# Patient Record
Sex: Male | Born: 1945 | Race: White | Hispanic: No | Marital: Married | Smoking: Former smoker
Health system: Southern US, Community
[De-identification: ages and names within clinical notes are randomized; demographics above are authoritative.]

## PROBLEM LIST (undated history)

## (undated) DIAGNOSIS — K648 Other hemorrhoids: Secondary | ICD-10-CM

## (undated) DIAGNOSIS — I4891 Unspecified atrial fibrillation: Secondary | ICD-10-CM

## (undated) DIAGNOSIS — D649 Anemia, unspecified: Secondary | ICD-10-CM

## (undated) DIAGNOSIS — I4892 Unspecified atrial flutter: Secondary | ICD-10-CM

## (undated) DIAGNOSIS — K219 Gastro-esophageal reflux disease without esophagitis: Secondary | ICD-10-CM

## (undated) DIAGNOSIS — I10 Essential (primary) hypertension: Secondary | ICD-10-CM

## (undated) DIAGNOSIS — I495 Sick sinus syndrome: Secondary | ICD-10-CM

## (undated) HISTORY — PX: HEMORROIDECTOMY: SUR656

## (undated) HISTORY — PX: PACEMAKER PLACEMENT: SHX43

---

## 2004-07-16 ENCOUNTER — Emergency Department (HOSPITAL_COMMUNITY): Admission: EM | Admit: 2004-07-16 | Discharge: 2004-07-16 | Payer: Self-pay | Admitting: Emergency Medicine

## 2005-03-20 ENCOUNTER — Emergency Department: Payer: Self-pay | Admitting: Emergency Medicine

## 2007-01-28 ENCOUNTER — Emergency Department: Payer: Self-pay | Admitting: General Practice

## 2008-04-26 ENCOUNTER — Other Ambulatory Visit: Payer: Self-pay

## 2008-04-26 ENCOUNTER — Inpatient Hospital Stay: Payer: Self-pay | Admitting: Internal Medicine

## 2008-05-27 ENCOUNTER — Inpatient Hospital Stay: Payer: Self-pay | Admitting: Internal Medicine

## 2008-05-27 ENCOUNTER — Other Ambulatory Visit: Payer: Self-pay

## 2008-05-29 ENCOUNTER — Other Ambulatory Visit: Payer: Self-pay

## 2008-05-30 ENCOUNTER — Other Ambulatory Visit: Payer: Self-pay

## 2008-08-05 ENCOUNTER — Emergency Department: Payer: Self-pay | Admitting: Emergency Medicine

## 2008-08-10 ENCOUNTER — Emergency Department: Payer: Self-pay | Admitting: Emergency Medicine

## 2009-12-07 ENCOUNTER — Inpatient Hospital Stay: Payer: Self-pay | Admitting: Internal Medicine

## 2010-06-28 ENCOUNTER — Emergency Department: Payer: Self-pay | Admitting: Emergency Medicine

## 2011-01-10 ENCOUNTER — Emergency Department: Payer: Self-pay | Admitting: Emergency Medicine

## 2011-01-21 ENCOUNTER — Emergency Department: Payer: Self-pay | Admitting: Emergency Medicine

## 2011-07-27 ENCOUNTER — Emergency Department: Payer: Self-pay | Admitting: Internal Medicine

## 2012-05-02 DIAGNOSIS — R339 Retention of urine, unspecified: Secondary | ICD-10-CM | POA: Insufficient documentation

## 2012-05-02 DIAGNOSIS — R350 Frequency of micturition: Secondary | ICD-10-CM | POA: Insufficient documentation

## 2012-05-02 DIAGNOSIS — N401 Enlarged prostate with lower urinary tract symptoms: Secondary | ICD-10-CM | POA: Insufficient documentation

## 2012-07-20 LAB — CBC
HCT: 36.5 % — ABNORMAL LOW (ref 40.0–52.0)
MCH: 30.9 pg (ref 26.0–34.0)
MCHC: 34.2 g/dL (ref 32.0–36.0)
MCV: 91 fL (ref 80–100)
RDW: 13.3 % (ref 11.5–14.5)

## 2012-07-20 LAB — BASIC METABOLIC PANEL
Anion Gap: 7 (ref 7–16)
BUN: 8 mg/dL (ref 7–18)
Chloride: 107 mmol/L (ref 98–107)
Creatinine: 1.38 mg/dL — ABNORMAL HIGH (ref 0.60–1.30)
EGFR (African American): >60
EGFR (Non-African Amer.): 52 — ABNORMAL LOW
Glucose: 133 mg/dL — ABNORMAL HIGH (ref 65–99)

## 2012-07-20 LAB — TROPONIN I: Troponin-I: <0.02 ng/mL

## 2012-07-21 ENCOUNTER — Observation Stay: Payer: Self-pay | Admitting: Internal Medicine

## 2012-07-21 LAB — HEPATIC FUNCTION PANEL A (ARMC)
Albumin: 3.8 g/dL (ref 3.4–5.0)
Alkaline Phosphatase: 68 U/L (ref 50–136)
Bilirubin,Total: 1.1 mg/dL — ABNORMAL HIGH (ref 0.2–1.0)
SGOT(AST): 19 U/L (ref 15–37)
SGPT (ALT): 17 U/L (ref 12–78)
Total Protein: 7.2 g/dL (ref 6.4–8.2)

## 2012-07-21 LAB — CK TOTAL AND CKMB (NOT AT ARMC)
CK, Total: 113 U/L (ref 35–232)
CK, Total: 128 U/L (ref 35–232)
CK-MB: 2.9 ng/mL (ref 0.5–3.6)
CK-MB: 2 ng/mL (ref 0.5–3.6)

## 2012-07-21 LAB — TROPONIN I: Troponin-I: <0.02 ng/mL

## 2012-09-14 ENCOUNTER — Emergency Department: Payer: Self-pay | Admitting: Emergency Medicine

## 2012-11-10 ENCOUNTER — Emergency Department: Payer: Self-pay | Admitting: Emergency Medicine

## 2012-11-11 LAB — CBC WITH DIFFERENTIAL/PLATELET
Basophil #: 0.1 10*3/uL (ref 0.0–0.1)
Basophil %: 1 %
Eosinophil #: 0.1 10*3/uL (ref 0.0–0.7)
Eosinophil %: 1.8 %
HCT: 37.8 % — ABNORMAL LOW (ref 40.0–52.0)
HGB: 13.2 g/dL (ref 13.0–18.0)
Lymphocyte #: 1.9 10*3/uL (ref 1.0–3.6)
Lymphocyte %: 29.2 %
MCH: 31.8 pg (ref 26.0–34.0)
Platelet: 175 10*3/uL (ref 150–440)
RDW: 13 % (ref 11.5–14.5)
WBC: 6.3 10*3/uL (ref 3.8–10.6)

## 2012-11-11 LAB — COMPREHENSIVE METABOLIC PANEL
Alkaline Phosphatase: 74 U/L (ref 50–136)
Calcium, Total: 8.9 mg/dL (ref 8.5–10.1)
Chloride: 103 mmol/L (ref 98–107)
Glucose: 86 mg/dL (ref 65–99)
Potassium: 3.6 mmol/L (ref 3.5–5.1)
SGOT(AST): 20 U/L (ref 15–37)
Sodium: 139 mmol/L (ref 136–145)

## 2012-11-11 LAB — PROTIME-INR: INR: 1

## 2013-03-22 ENCOUNTER — Emergency Department: Payer: Self-pay | Admitting: Emergency Medicine

## 2013-04-04 ENCOUNTER — Emergency Department: Payer: Self-pay | Admitting: Unknown Physician Specialty

## 2013-04-04 LAB — CBC
MCH: 31.2 pg (ref 26.0–34.0)
Platelet: 187 10*3/uL (ref 150–440)
RDW: 12.9 % (ref 11.5–14.5)

## 2013-04-04 LAB — COMPREHENSIVE METABOLIC PANEL
Albumin: 3.9 g/dL (ref 3.4–5.0)
Anion Gap: 3 — ABNORMAL LOW (ref 7–16)
BUN: 13 mg/dL (ref 7–18)
Bilirubin,Total: 0.9 mg/dL (ref 0.2–1.0)
Chloride: 105 mmol/L (ref 98–107)
Co2: 30 mmol/L (ref 21–32)
EGFR (Non-African Amer.): 53 — ABNORMAL LOW
Osmolality: 275 (ref 275–301)
Potassium: 3.9 mmol/L (ref 3.5–5.1)
Sodium: 138 mmol/L (ref 136–145)
Total Protein: 7 g/dL (ref 6.4–8.2)

## 2013-04-05 LAB — APTT: Activated PTT: 29.4 secs (ref 23.6–35.9)

## 2013-04-05 LAB — PROTIME-INR: INR: 1

## 2013-11-01 ENCOUNTER — Ambulatory Visit: Payer: Self-pay | Admitting: Internal Medicine

## 2014-04-10 LAB — CBC
HCT: 36.6 % — ABNORMAL LOW (ref 40.0–52.0)
HGB: 12.4 g/dL — AB (ref 13.0–18.0)
MCH: 30.1 pg (ref 26.0–34.0)
MCHC: 33.9 g/dL (ref 32.0–36.0)
MCV: 89 fL (ref 80–100)
PLATELETS: 232 10*3/uL (ref 150–440)
RBC: 4.12 10*6/uL — AB (ref 4.40–5.90)
RDW: 13.8 % (ref 11.5–14.5)
WBC: 14.2 10*3/uL — AB (ref 3.8–10.6)

## 2014-04-10 LAB — BASIC METABOLIC PANEL
Anion Gap: 8 (ref 7–16)
BUN: 16 mg/dL (ref 7–18)
Calcium, Total: 8.7 mg/dL (ref 8.5–10.1)
Chloride: 108 mmol/L — ABNORMAL HIGH (ref 98–107)
Co2: 25 mmol/L (ref 21–32)
Creatinine: 1.86 mg/dL — ABNORMAL HIGH (ref 0.60–1.30)
EGFR (Non-African Amer.): 36 — ABNORMAL LOW
GFR CALC AF AMER: 42 — AB
Glucose: 115 mg/dL — ABNORMAL HIGH (ref 65–99)
OSMOLALITY: 283 (ref 275–301)
POTASSIUM: 3.6 mmol/L (ref 3.5–5.1)
Sodium: 141 mmol/L (ref 136–145)

## 2014-04-10 LAB — TROPONIN I: Troponin-I: <0.02 ng/mL

## 2014-04-10 LAB — CK TOTAL AND CKMB (NOT AT ARMC)
CK, Total: 115 U/L
CK-MB: 1 ng/mL (ref 0.5–3.6)

## 2014-04-10 LAB — PRO B NATRIURETIC PEPTIDE: B-Type Natriuretic Peptide: 1697 pg/mL — ABNORMAL HIGH (ref 0–125)

## 2014-04-11 ENCOUNTER — Observation Stay: Payer: Self-pay | Admitting: Family Medicine

## 2014-04-11 LAB — BASIC METABOLIC PANEL
ANION GAP: 10 (ref 7–16)
BUN: 18 mg/dL (ref 7–18)
CHLORIDE: 105 mmol/L (ref 98–107)
CREATININE: 1.87 mg/dL — AB (ref 0.60–1.30)
Calcium, Total: 9.2 mg/dL (ref 8.5–10.1)
Co2: 27 mmol/L (ref 21–32)
EGFR (African American): 41 — ABNORMAL LOW
EGFR (Non-African Amer.): 36 — ABNORMAL LOW
Glucose: 95 mg/dL (ref 65–99)
OSMOLALITY: 285 (ref 275–301)
POTASSIUM: 3.5 mmol/L (ref 3.5–5.1)
SODIUM: 142 mmol/L (ref 136–145)

## 2014-04-11 LAB — TROPONIN I
Troponin-I: <0.02 ng/mL
Troponin-I: <0.02 ng/mL

## 2014-04-11 LAB — CK TOTAL AND CKMB (NOT AT ARMC)
CK, TOTAL: 102 U/L
CK, Total: 79 U/L
CK-MB: 1 ng/mL (ref 0.5–3.6)
CK-MB: 1 ng/mL (ref 0.5–3.6)

## 2014-04-12 LAB — CBC WITH DIFFERENTIAL/PLATELET
BASOS ABS: 0 10*3/uL (ref 0.0–0.1)
BASOS PCT: 0.6 %
EOS ABS: 0.1 10*3/uL (ref 0.0–0.7)
Eosinophil %: 1.9 %
HCT: 35.5 % — ABNORMAL LOW (ref 40.0–52.0)
HGB: 12.1 g/dL — ABNORMAL LOW (ref 13.0–18.0)
LYMPHS ABS: 1.4 10*3/uL (ref 1.0–3.6)
Lymphocyte %: 20.7 %
MCH: 30.1 pg (ref 26.0–34.0)
MCHC: 34.2 g/dL (ref 32.0–36.0)
MCV: 88 fL (ref 80–100)
MONO ABS: 0.5 x10 3/mm (ref 0.2–1.0)
Monocyte %: 6.9 %
Neutrophil #: 4.7 10*3/uL (ref 1.4–6.5)
Neutrophil %: 69.9 %
Platelet: 207 10*3/uL (ref 150–440)
RBC: 4.02 10*6/uL — ABNORMAL LOW (ref 4.40–5.90)
RDW: 13.5 % (ref 11.5–14.5)
WBC: 6.8 10*3/uL (ref 3.8–10.6)

## 2014-04-12 LAB — BASIC METABOLIC PANEL
Anion Gap: 5 — ABNORMAL LOW (ref 7–16)
BUN: 14 mg/dL (ref 7–18)
CHLORIDE: 108 mmol/L — AB (ref 98–107)
CREATININE: 1.53 mg/dL — AB (ref 0.60–1.30)
Calcium, Total: 9.2 mg/dL (ref 8.5–10.1)
Co2: 28 mmol/L (ref 21–32)
EGFR (African American): 53 — ABNORMAL LOW
EGFR (Non-African Amer.): 45 — ABNORMAL LOW
GLUCOSE: 91 mg/dL (ref 65–99)
OSMOLALITY: 281 (ref 275–301)
Potassium: 3.8 mmol/L (ref 3.5–5.1)
Sodium: 141 mmol/L (ref 136–145)

## 2014-04-12 LAB — MAGNESIUM: Magnesium: 1.8 mg/dL

## 2014-04-13 LAB — CBC WITH DIFFERENTIAL/PLATELET
BASOS ABS: 0.1 10*3/uL (ref 0.0–0.1)
BASOS PCT: 0.9 %
EOS ABS: 0.2 10*3/uL (ref 0.0–0.7)
Eosinophil %: 2.2 %
HCT: 34.3 % — AB (ref 40.0–52.0)
HGB: 11.9 g/dL — ABNORMAL LOW (ref 13.0–18.0)
LYMPHS ABS: 1.3 10*3/uL (ref 1.0–3.6)
Lymphocyte %: 17 %
MCH: 30.5 pg (ref 26.0–34.0)
MCHC: 34.6 g/dL (ref 32.0–36.0)
MCV: 88 fL (ref 80–100)
MONO ABS: 0.5 x10 3/mm (ref 0.2–1.0)
Monocyte %: 6.6 %
NEUTROS ABS: 5.4 10*3/uL (ref 1.4–6.5)
NEUTROS PCT: 73.3 %
Platelet: 222 10*3/uL (ref 150–440)
RBC: 3.9 10*6/uL — ABNORMAL LOW (ref 4.40–5.90)
RDW: 13.9 % (ref 11.5–14.5)
WBC: 7.4 10*3/uL (ref 3.8–10.6)

## 2014-04-13 LAB — BASIC METABOLIC PANEL
Anion Gap: 10 (ref 7–16)
BUN: 11 mg/dL (ref 7–18)
CHLORIDE: 107 mmol/L (ref 98–107)
Calcium, Total: 8.6 mg/dL (ref 8.5–10.1)
Co2: 26 mmol/L (ref 21–32)
Creatinine: 1.35 mg/dL — ABNORMAL HIGH (ref 0.60–1.30)
EGFR (African American): >60
EGFR (Non-African Amer.): 53 — ABNORMAL LOW
GLUCOSE: 86 mg/dL (ref 65–99)
OSMOLALITY: 284 (ref 275–301)
POTASSIUM: 3.7 mmol/L (ref 3.5–5.1)
Sodium: 143 mmol/L (ref 136–145)

## 2014-07-08 ENCOUNTER — Emergency Department (HOSPITAL_COMMUNITY): Payer: Medicare HMO

## 2014-07-08 ENCOUNTER — Inpatient Hospital Stay (HOSPITAL_COMMUNITY): Payer: Medicare HMO

## 2014-07-08 ENCOUNTER — Inpatient Hospital Stay (HOSPITAL_COMMUNITY)
Admission: EM | Admit: 2014-07-08 | Discharge: 2014-07-15 | DRG: 871 | Disposition: A | Discharge status: Home / Self Care | Payer: Medicare HMO | Attending: Family Medicine | Admitting: Family Medicine

## 2014-07-08 ENCOUNTER — Encounter (HOSPITAL_COMMUNITY): Payer: Self-pay | Admitting: Student

## 2014-07-08 DIAGNOSIS — Z23 Encounter for immunization: Secondary | ICD-10-CM | POA: Diagnosis not present

## 2014-07-08 DIAGNOSIS — Z87891 Personal history of nicotine dependence: Secondary | ICD-10-CM | POA: Diagnosis not present

## 2014-07-08 DIAGNOSIS — N179 Acute kidney failure, unspecified: Secondary | ICD-10-CM | POA: Diagnosis present

## 2014-07-08 DIAGNOSIS — Z7901 Long term (current) use of anticoagulants: Secondary | ICD-10-CM | POA: Insufficient documentation

## 2014-07-08 DIAGNOSIS — Z6833 Body mass index (BMI) 33.0-33.9, adult: Secondary | ICD-10-CM

## 2014-07-08 DIAGNOSIS — A419 Sepsis, unspecified organism: Secondary | ICD-10-CM | POA: Diagnosis present

## 2014-07-08 DIAGNOSIS — J9811 Atelectasis: Secondary | ICD-10-CM | POA: Diagnosis not present

## 2014-07-08 DIAGNOSIS — I5032 Chronic diastolic (congestive) heart failure: Secondary | ICD-10-CM | POA: Diagnosis not present

## 2014-07-08 DIAGNOSIS — G9341 Metabolic encephalopathy: Secondary | ICD-10-CM | POA: Diagnosis present

## 2014-07-08 DIAGNOSIS — E039 Hypothyroidism, unspecified: Secondary | ICD-10-CM | POA: Insufficient documentation

## 2014-07-08 DIAGNOSIS — I4892 Unspecified atrial flutter: Secondary | ICD-10-CM | POA: Diagnosis present

## 2014-07-08 DIAGNOSIS — I519 Heart disease, unspecified: Secondary | ICD-10-CM | POA: Diagnosis present

## 2014-07-08 DIAGNOSIS — Z950 Presence of cardiac pacemaker: Secondary | ICD-10-CM | POA: Diagnosis not present

## 2014-07-08 DIAGNOSIS — R4182 Altered mental status, unspecified: Secondary | ICD-10-CM

## 2014-07-08 DIAGNOSIS — E785 Hyperlipidemia, unspecified: Secondary | ICD-10-CM | POA: Diagnosis present

## 2014-07-08 DIAGNOSIS — Z79899 Other long term (current) drug therapy: Secondary | ICD-10-CM | POA: Diagnosis not present

## 2014-07-08 DIAGNOSIS — N400 Enlarged prostate without lower urinary tract symptoms: Secondary | ICD-10-CM | POA: Diagnosis not present

## 2014-07-08 DIAGNOSIS — N183 Chronic kidney disease, stage 3 (moderate): Secondary | ICD-10-CM | POA: Diagnosis not present

## 2014-07-08 DIAGNOSIS — J449 Chronic obstructive pulmonary disease, unspecified: Secondary | ICD-10-CM | POA: Diagnosis present

## 2014-07-08 DIAGNOSIS — IMO0001 Reserved for inherently not codable concepts without codable children: Secondary | ICD-10-CM | POA: Clinically undetermined

## 2014-07-08 DIAGNOSIS — Z7982 Long term (current) use of aspirin: Secondary | ICD-10-CM

## 2014-07-08 DIAGNOSIS — R509 Fever, unspecified: Secondary | ICD-10-CM | POA: Diagnosis present

## 2014-07-08 DIAGNOSIS — D649 Anemia, unspecified: Secondary | ICD-10-CM | POA: Diagnosis not present

## 2014-07-08 DIAGNOSIS — L03116 Cellulitis of left lower limb: Secondary | ICD-10-CM | POA: Diagnosis not present

## 2014-07-08 DIAGNOSIS — I495 Sick sinus syndrome: Secondary | ICD-10-CM | POA: Insufficient documentation

## 2014-07-08 DIAGNOSIS — I4891 Unspecified atrial fibrillation: Secondary | ICD-10-CM | POA: Diagnosis present

## 2014-07-08 DIAGNOSIS — I129 Hypertensive chronic kidney disease with stage 1 through stage 4 chronic kidney disease, or unspecified chronic kidney disease: Secondary | ICD-10-CM | POA: Diagnosis present

## 2014-07-08 DIAGNOSIS — R6510 Systemic inflammatory response syndrome (SIRS) of non-infectious origin without acute organ dysfunction: Secondary | ICD-10-CM

## 2014-07-08 HISTORY — DX: Essential (primary) hypertension: I10

## 2014-07-08 HISTORY — DX: Sick sinus syndrome: I49.5

## 2014-07-08 HISTORY — DX: Other hemorrhoids: K64.8

## 2014-07-08 HISTORY — DX: Anemia, unspecified: D64.9

## 2014-07-08 HISTORY — DX: Gastro-esophageal reflux disease without esophagitis: K21.9

## 2014-07-08 HISTORY — DX: Unspecified atrial flutter: I48.92

## 2014-07-08 HISTORY — DX: Unspecified atrial fibrillation: I48.91

## 2014-07-08 LAB — URINALYSIS, ROUTINE W REFLEX MICROSCOPIC
Bilirubin Urine: NEGATIVE
Glucose, UA: NEGATIVE mg/dL
HGB URINE DIPSTICK: NEGATIVE
Ketones, ur: 15 mg/dL — AB
Leukocytes, UA: NEGATIVE
Nitrite: NEGATIVE
Protein, ur: 30 mg/dL — AB
Specific Gravity, Urine: 1.02 (ref 1.005–1.030)
Urobilinogen, UA: 1 mg/dL (ref 0.0–1.0)
pH: 8 (ref 5.0–8.0)

## 2014-07-08 LAB — CSF CELL COUNT WITH DIFFERENTIAL
RBC Count, CSF: 1 /mm3 — ABNORMAL HIGH
Tube #: 3
WBC, CSF: 2 /mm3 (ref 0–5)

## 2014-07-08 LAB — CBC WITH DIFFERENTIAL/PLATELET
Basophils Absolute: 0 10*3/uL (ref 0.0–0.1)
Basophils Relative: 0 % (ref 0–1)
Eosinophils Absolute: 0 10*3/uL (ref 0.0–0.7)
Eosinophils Relative: 0 % (ref 0–5)
HCT: 38 % — ABNORMAL LOW (ref 39.0–52.0)
Hemoglobin: 12.5 g/dL — ABNORMAL LOW (ref 13.0–17.0)
Lymphocytes Relative: 2 % — ABNORMAL LOW (ref 12–46)
Lymphs Abs: 0.3 10*3/uL — ABNORMAL LOW (ref 0.7–4.0)
MCH: 30 pg (ref 26.0–34.0)
MCHC: 32.9 g/dL (ref 30.0–36.0)
MCV: 91.3 fL (ref 78.0–100.0)
Monocytes Absolute: 0.4 10*3/uL (ref 0.1–1.0)
Monocytes Relative: 3 % (ref 3–12)
NEUTROS PCT: 95 % — AB (ref 43–77)
Neutro Abs: 14.9 10*3/uL — ABNORMAL HIGH (ref 1.7–7.7)
Platelets: 148 10*3/uL — ABNORMAL LOW (ref 150–400)
RBC: 4.16 MIL/uL — ABNORMAL LOW (ref 4.22–5.81)
RDW: 13.8 % (ref 11.5–15.5)
WBC: 15.7 10*3/uL — ABNORMAL HIGH (ref 4.0–10.5)

## 2014-07-08 LAB — COMPREHENSIVE METABOLIC PANEL
ALT: 11 U/L (ref 0–53)
AST: 20 U/L (ref 0–37)
Albumin: 3.5 g/dL (ref 3.5–5.2)
Alkaline Phosphatase: 57 U/L (ref 39–117)
Anion gap: 15 (ref 5–15)
BILIRUBIN TOTAL: 1.8 mg/dL — AB (ref 0.3–1.2)
BUN: 18 mg/dL (ref 6–23)
CHLORIDE: 103 meq/L (ref 96–112)
CO2: 22 mEq/L (ref 19–32)
Calcium: 8.5 mg/dL (ref 8.4–10.5)
Creatinine, Ser: 1.52 mg/dL — ABNORMAL HIGH (ref 0.50–1.35)
GFR calc Af Amer: 52 mL/min — ABNORMAL LOW (ref >90)
GFR calc non Af Amer: 45 mL/min — ABNORMAL LOW (ref >90)
Glucose, Bld: 105 mg/dL — ABNORMAL HIGH (ref 70–99)
Potassium: 4.2 mEq/L (ref 3.7–5.3)
Sodium: 140 mEq/L (ref 137–147)
Total Protein: 6.5 g/dL (ref 6.0–8.3)

## 2014-07-08 LAB — I-STAT CG4 LACTIC ACID, ED
Lactic Acid, Venous: 3.53 mmol/L — ABNORMAL HIGH (ref 0.5–2.2)
Lactic Acid, Venous: 1.32 mmol/L (ref 0.5–2.2)

## 2014-07-08 LAB — PROTEIN, CSF: TOTAL PROTEIN, CSF: 55 mg/dL — AB (ref 15–45)

## 2014-07-08 LAB — URINE MICROSCOPIC-ADD ON

## 2014-07-08 LAB — GLUCOSE, CSF: GLUCOSE CSF: 63 mg/dL (ref 43–76)

## 2014-07-08 LAB — GRAM STAIN

## 2014-07-08 LAB — TROPONIN I: Troponin I: <0.3 ng/mL (ref <0.30)

## 2014-07-08 MED ORDER — SODIUM CHLORIDE 0.9 % IV SOLN
Freq: Once | INTRAVENOUS | Status: AC
  Administered 2014-07-08: 11:00:00 via INTRAVENOUS

## 2014-07-08 MED ORDER — SODIUM CHLORIDE 0.9 % IV SOLN
INTRAVENOUS | Status: AC
  Administered 2014-07-08: 16:00:00 via INTRAVENOUS

## 2014-07-08 MED ORDER — VANCOMYCIN HCL IN DEXTROSE 1-5 GM/200ML-% IV SOLN
1000.0000 mg | Freq: Once | INTRAVENOUS | Status: AC
Start: 2014-07-08 — End: 2014-07-08
  Administered 2014-07-08: 1000 mg via INTRAVENOUS
  Filled 2014-07-08: qty 200

## 2014-07-08 MED ORDER — ACETAMINOPHEN 500 MG PO TABS
1000.0000 mg | ORAL_TABLET | Freq: Once | ORAL | Status: AC
  Administered 2014-07-08: 1000 mg via ORAL
  Filled 2014-07-08: qty 2

## 2014-07-08 MED ORDER — INFLUENZA VAC SPLIT QUAD 0.5 ML IM SUSY
0.5000 mL | PREFILLED_SYRINGE | INTRAMUSCULAR | Status: DC
  Filled 2014-07-08: qty 0.5

## 2014-07-08 MED ORDER — TAMSULOSIN HCL 0.4 MG PO CAPS
0.4000 mg | ORAL_CAPSULE | Freq: Every day | ORAL | Status: DC
  Administered 2014-07-09 – 2014-07-15 (×7): 0.4 mg via ORAL
  Filled 2014-07-08 (×7): qty 1

## 2014-07-08 MED ORDER — HEPARIN SODIUM (PORCINE) 5000 UNIT/ML IJ SOLN
5000.0000 [IU] | Freq: Three times a day (TID) | INTRAMUSCULAR | Status: DC
  Administered 2014-07-08 – 2014-07-09 (×3): 5000 [IU] via SUBCUTANEOUS
  Filled 2014-07-08 (×5): qty 1

## 2014-07-08 MED ORDER — ASPIRIN EC 81 MG PO TBEC
81.0000 mg | DELAYED_RELEASE_TABLET | Freq: Every day | ORAL | Status: DC
  Administered 2014-07-09: 81 mg via ORAL
  Filled 2014-07-08: qty 1

## 2014-07-08 MED ORDER — SODIUM CHLORIDE 0.9 % IJ SOLN
3.0000 mL | Freq: Two times a day (BID) | INTRAMUSCULAR | Status: DC
  Administered 2014-07-08 – 2014-07-14 (×6): 3 mL via INTRAVENOUS

## 2014-07-08 MED ORDER — ASPIRIN 81 MG PO TABS: 81.0000 mg | ORAL_TABLET | Freq: Every day | ORAL | Status: DC

## 2014-07-08 MED ORDER — ACETAMINOPHEN 650 MG RE SUPP: 650.0000 mg | Freq: Four times a day (QID) | RECTAL | Status: DC | PRN

## 2014-07-08 MED ORDER — PIPERACILLIN-TAZOBACTAM 3.375 G IVPB
3.3750 g | Freq: Three times a day (TID) | INTRAVENOUS | Status: DC
  Administered 2014-07-08: 3.375 g via INTRAVENOUS
  Filled 2014-07-08 (×2): qty 50

## 2014-07-08 MED ORDER — SODIUM CHLORIDE 0.9 % IV BOLUS (SEPSIS)
1000.0000 mL | Freq: Once | INTRAVENOUS | Status: AC
  Administered 2014-07-08: 1000 mL via INTRAVENOUS

## 2014-07-08 MED ORDER — ACETAMINOPHEN 325 MG PO TABS: 650.0000 mg | ORAL_TABLET | Freq: Four times a day (QID) | ORAL | Status: DC | PRN

## 2014-07-08 MED ORDER — SODIUM CHLORIDE 0.9 % IV SOLN
INTRAVENOUS | Status: DC
  Administered 2014-07-08: 20:00:00 via INTRAVENOUS

## 2014-07-08 MED ORDER — DEXTROSE 5 % IV SOLN
1.0000 g | Freq: Three times a day (TID) | INTRAVENOUS | Status: DC
  Administered 2014-07-09 – 2014-07-10 (×5): 1 g via INTRAVENOUS
  Filled 2014-07-08 (×7): qty 1

## 2014-07-08 MED ORDER — DIPHENHYDRAMINE HCL 50 MG/ML IJ SOLN
25.0000 mg | Freq: Once | INTRAMUSCULAR | Status: AC
  Administered 2014-07-09: 25 mg via INTRAVENOUS

## 2014-07-08 MED ORDER — VITAMIN B-12 1000 MCG PO TABS
1000.0000 ug | ORAL_TABLET | Freq: Every day | ORAL | Status: DC
  Administered 2014-07-09 – 2014-07-15 (×7): 1000 ug via ORAL
  Filled 2014-07-08 (×7): qty 1

## 2014-07-08 MED ORDER — VANCOMYCIN HCL IN DEXTROSE 750-5 MG/150ML-% IV SOLN
750.0000 mg | Freq: Two times a day (BID) | INTRAVENOUS | Status: DC
  Administered 2014-07-09 (×3): 750 mg via INTRAVENOUS
  Filled 2014-07-08 (×4): qty 150

## 2014-07-08 MED ORDER — PIPERACILLIN-TAZOBACTAM 3.375 G IVPB 30 MIN
3.3750 g | Freq: Once | INTRAVENOUS | Status: AC
  Administered 2014-07-08: 3.375 g via INTRAVENOUS
  Filled 2014-07-08: qty 50

## 2014-07-08 NOTE — ED Notes (Signed)
Admitting doctor at bedside 

## 2014-07-08 NOTE — ED Notes (Signed)
Called to give report to 2 heart. RN to call back.

## 2014-07-08 NOTE — ED Notes (Signed)
Lactic acid results given to Dr. Lockwood 

## 2014-07-08 NOTE — ED Notes (Signed)
Patient with fever x 2 days, patient also with dark, foul smelling odor, patient denies pain, states symptoms x 2 days

## 2014-07-08 NOTE — Progress Notes (Signed)
Pt arrived to room 2h25 at this time.  Settled pt in and obtained vs. Report to be given to oncoming RN

## 2014-07-08 NOTE — ED Notes (Signed)
Lorin PicketScott, RN aware of pt's blood pressure

## 2014-07-08 NOTE — H&P (Signed)
Family Medicine [facility] Admission History and Physical Service Pager: [phone]  Patient name: Edward [name] Medical record number: [id number] Date of birth: 26-May-1946 Age: 68 y.o. Gender: male  Primary Care Provider: No primary care provider on file. Consultants: none Code Status: FULL  Chief Complaint: fever, weakness, AMS  Assessment and Plan: Edward [name] is a 68 y.o. male presenting with SIRS and AMS . PMH is significant for sinoatrial node dysfunction with pacemaker, a fib, LV dysfunction, GERD, HTN.  # SIRS: Meets sirs criteria with fever to 40.4, tachycardic to 132, leukocytosis to 15, SBP of 83, hyperlactatemia of 3.53, and AMS. Source unknown at this time. CXR clear. Urine clean. No meningeal signs on exam. Some mild swelling/redness of left ankle but does not appear significant enough to be the source. Concern for central process given mental slowing - on empiric coverage with vanc/zosyn - LP attempted at bedside but unsuccessful, IR to complete under fluoro guidance - repeat CXR in am - f/u urine and blood cxs - monitor on tele  - will repeat CBC, Bmet in AM - fluids: NS at 125/hr  # A fib/flutter: Initially in a fib with RVR, but now in normal sinus rhythm. Has pacemaker. Last echo unknown. Home meds include amiodarone and metoprolol.  - will hold off on continuing amio and metop given low BP currently, d/t long half-life will still have this for rate control - monitor on tele - consider echo to evaluate cardiac function  FEN/GI: heart healthy diet, NS @ 13725mL/hr Prophylaxis: subq heparin  Disposition: admit to stepdown, attending Dr. Jennette KettleNeal  History of Present Illness: Edward [name] is a 68 y.o. male presenting with 2 day history of fever, malodorous urine, generalized weakness and confusion. Denies CP, abdominal pain. He states that for the past 2 days he has felt like his heart was fluttering. He has generally felt unwell, has been shaky  and has had subjective fevers. He has also been feeling confused. No dysuria or frequency. No diarrhea, vomiting, or nausea. No coughing. About a month ago had sneezing and runny nose that he attributed to rag weed.   Review Of Systems: Per HPI. Otherwise 12 point review of systems was performed and was unremarkable.   Patient Active Problem List   Diagnosis Date Noted  . Sepsis 07/08/2014  . Atrial fibrillation and flutter 07/08/2014  . Left ventricular dysfunction 07/08/2014  . Hypothyroidism 07/08/2014  . Sick sinus syndrome 07/08/2014  . Cardiac pacemaker 07/08/2014  . Benign prostatic hyperplasia with urinary obstruction 05/02/2012   Past Medical History: Past Medical History  Diagnosis Date  . Sinoatrial node dysfunction   . Atrial fibrillation and flutter   . Anemia   . GERD (gastroesophageal reflux disease)   . HTN (hypertension)   . Internal hemorrhoids with complication    Past Surgical History: Past Surgical History  Procedure Laterality Date  . Pacemaker placement    . Hemorroidectomy     Social History: History  Substance Use Topics  . Smoking status: Never Smoker   . Smokeless tobacco: Not on file  . Alcohol Use: No   Additional social history: Married, retired. Please also refer to relevant sections of EMR.  Family History: Family History  Problem Relation Age of Onset  . Leukemia Mother    Allergies and Medications: Allergies  Allergen Reactions  . Penicillins Rash   No current facility-administered medications on file prior to encounter.   No current outpatient prescriptions on file prior to encounter.  Objective: BP 92/42 mmHg  Pulse 61  Temp(Src) 98.1 F (36.7 C) (Oral)  Resp 18  Ht 5' 8.5" (1.74 m)  Wt 95.255 kg (210 lb)  BMI 31.46 kg/m2  SpO2 100% Exam: General: NAD, lying in bed HEENT: head atraumatic, EOMI, eyes anicteric, nose without discharge, MMM Cardiovascular: RRR, heart sounds distant, no murmur  appreciated Respiratory: normal wob, CTAB, no crackles, scattered wheezes Abdomen: NABS, non-tender, non-distended, no masses or HSM Extremities: no edema, dorsalis pedis pulses 2+ bilaterally Skin: warm and clammy diffusely, no lesions or rashes Neuro: alert, oriented to person and place, slowness of speech with word-finding difficulty, memory intact  Labs and Imaging: CBC BMET   Recent Labs Lab 07/08/14 1005  WBC 15.7*  HGB 12.5*  HCT 38.0*  PLT 148*    Recent Labs Lab 07/08/14 1005  NA 140  K 4.2  CL 103  CO2 22  BUN 18  CREATININE 1.52*  GLUCOSE 105*  CALCIUM 8.5    Imaging:  EKG: A fib with RVR (at 0851)  Dg Chest 2 View  07/08/2014 CLINICAL DATA: Altered mental status and fever for 2 days EXAM: CHEST 2 VIEW COMPARISON: PA and lateral chest of July 16, 2004 FINDINGS: The lungs are mildly hyperinflated but clear. The heart and pulmonary vascularity are normal. The permanent pacemaker is in appropriate position radiographically. There is no pleural effusion or pneumothorax. The observed bony thorax is unremarkable. IMPRESSION: Hyperinflation consistent with reactive airway disease or COPD. There is no evidence of pneumonia, CHF, or other acute cardiopulmonary abnormality. Electronically Signed By: David SwazilandJordan On: 07/08/2014 09:44   Ct Head Wo Contrast  07/08/2014 CLINICAL DATA: 68 year old male with altered mental status. Fever for 2 days. Initial encounter. EXAM: CT HEAD WITHOUT CONTRAST TECHNIQUE: Contiguous axial images were obtained from the base of the skull through the vertex without intravenous contrast. COMPARISON: None. FINDINGS: No intracranial hemorrhage. No CT evidence of large acute infarct. No hydrocephalus. Cerebellar tonsils minimally low lying. No intracranial mass lesion noted on this unenhanced exam. Partial opacification inferior aspect left sphenoid sinus. Partially empty sella. Orbital structures unremarkable.  IMPRESSION: No intracranial hemorrhage. No CT evidence of large acute infarct. Cerebellar tonsils minimally low lying. No intracranial mass lesion noted on this unenhanced exam. Partial opacification inferior aspect left sphenoid sinus.  Electronically Signed By: Bridgett LarssonSteve Olson M.D. On: 07/08/2014 09:47   Charlyne PetrinLiza R Straub, Med Student 07/08/2014, 3:08 PM [facility] FPTS Intern pager: [phone], text pages welcome   I agree with the medical student note above and have edited it as necessary. I formulated the plan and performed my own physical exam which are documented above.  Beverely LowElena Adamo, MD, MPH [facility] PGY-2 07/08/2014 4:03 PM

## 2014-07-08 NOTE — ED Notes (Signed)
Pt has returned from being out of the department; pt placed back on monitor, continuous pulse oximetry and blood pressure cuff 

## 2014-07-08 NOTE — ED Notes (Signed)
Myself and [name], NT totally undressed pt, placed in gown, on monitor, continuous pulse oximetry and blood pressure cuff; pt's clothes (shirt, undershirt, underwear, pants and belt) and bedroom shoes were placed in a personal belongings bag and placed at bedside

## 2014-07-08 NOTE — Progress Notes (Signed)
MD called by Delice Bisonara, RN concerning patient's penicillin allergy and his order for Zosyn.   Per patient, he had welts the size of his "pinky tip" on his abdomen the last time he took penicillin in the 1970s. No respiratory issues. Patient received a dose of Zosyn today at noon- denies any rash, pruritus, or SOB/difficulty breathing.  MD called pharmacist, Tammy SoursGreg, who felt the patient would be okay to receive Zosyn this evening.   Delice Bisonara, RN, will administer Zosyn and continue to monitor patient for allergic type reactions.   Joanna Puffrystal S. Sydney Hasten, MD [facility] Resident  07/08/2014, 11:08 PM

## 2014-07-08 NOTE — Procedures (Addendum)
PROCEDURE NOTE: patient given informed consent for lumbar puncture, signed copy of consent form is scanned to the chart. Appropriate time out taken. Patient placed in a seated position, leaning over a bedside table resting on a pillow. Lumbar spine area prepped and draped in usual sterile fashion. Landmarks identified and 4 cc of lidocaine 1% without epinephrine used for local anesthesia. Once anesthesia was obtained, a spinal needle was inserted into the L3-L4 vertebral space using a posterior approach. Unfortunately no spinal fluid was obtained. Four separate attempts made with 2 separate spinal needles (first two attempts with 22 gauge and last two attempts with 18 gauge),  2 attemots in the L3-L4 interspace and one each in the L2-L3  And the L4-L5 interspace using care to re-insert trocar before insertion of the spinal needle was inserted into the back. As no fluid was obtained, the procedure was discontinued and a bandaid applied to the puncture site. Less than 1 cc blood loss. No complications. Oxygen saturations and BP monitored throughout procedure.

## 2014-07-08 NOTE — Progress Notes (Signed)
Paged by Delice Bisonara, RN, concerning new rash on abdomen during the Zosyn infusion. Zosyn infusion discontinued immediately.  Patient asymptomatic: no pruritus, SOB, or difficulty breathing. No feelings of warmth.   HR 108, RR 14, O2 94% on RA  Erythematous maculopapular rash over the upper abdomen, mostly over the anterior ribs bilaterally.  Lungs: No increased WOB, CTAB over there anterior chest with no stridor, wheezing, rhonchi, or crackles. O2 94-96% on RA  Spoke with Christiane HaJonathan with pharmacy:  -Will switch from Zosyn to aztreonam, could consider cefepime as well in the AM.   -Gave patient benadryl 25mg  IV.  -If rash worsens, will consider solumedrol.   -If any respiratory compromise, will give epinephrine.

## 2014-07-08 NOTE — ED Notes (Signed)
Patient taken to imaging for guided lumbar puncture.

## 2014-07-08 NOTE — Progress Notes (Signed)
ANTIBIOTIC CONSULT NOTE - INITIAL  Pharmacy Consult:  Vancomycin / Zosyn Indication:  Sepsis  No Known Allergies  Patient Measurements: Height: 5' 8.5" (174 cm) Weight: 210 lb (95.255 kg) IBW/kg (Calculated) : 69.55  Vital Signs: Temp: 104.8 F (40.4 C) (11/03 0856) Temp Source: Rectal (11/03 0856) BP: 96/48 mmHg (11/03 1118) Pulse Rate: 127 (11/03 1118)  Labs:  Recent Labs  07/08/14 1005  WBC 15.7*  HGB 12.5*  PLT 148*  CREATININE 1.52*   CrCl cannot be calculated (Unknown ideal weight.). No results for input(s): VANCOTROUGH, VANCOPEAK, VANCORANDOM, GENTTROUGH, GENTPEAK, GENTRANDOM, TOBRATROUGH, TOBRAPEAK, TOBRARND, AMIKACINPEAK, AMIKACINTROU, AMIKACIN in the last 72 hours.   Microbiology: No results found for this or any previous visit (from the past 720 hour(s)).  Medical History: No past medical history on file.    Assessment: [age] YOM with no significant PMH presented with 2 days of fever and malodorous urine.  Pharmacy consulted to manage vancomycin and Zosyn for sepsis.  First doses of antibiotics already ordered.  Unsure of baseline renal function but currently with some renal dysfunction.   Goal of Therapy:  Vancomycin trough level 15-20 mcg/ml   Plan:  - Vanc 750mg  IV Q12H - Zosyn 3.375gm IV Q8H, 4 hr infusion - Monitor renal fxn, clinical progress, vanc trough as indicated    Rhianna Raulerson D. Laney Potashang, PharmD, BCPS Pager:  [phone] 07/08/2014, 12:31 PM

## 2014-07-08 NOTE — ED Provider Notes (Addendum)
CSN: [id number]     Arrival date & time 07/08/14  [id number] History   First MD Initiated Contact with Patient 07/08/14 [phone]     Chief Complaint  Patient presents with  . Fever      HPI  History of present illness is provided by the patient, though his speech is slow, answers brief, he does seem to be answering questions appropriately.  Patient presents from home with complaints of fever, malodorous urine, confusion. Symptoms seem to have begun 2 days ago.  Since onset symptoms have been increasing. Patient also combines generalized weakness. Patient denies chest pain, belly pain. Patient denies substantial medical problems. Since onset no clear alleviating or exacerbating factors.   No past medical history on file. No past surgical history on file. No family history on file. History  Substance Use Topics  . Smoking status: Not on file  . Smokeless tobacco: Not on file  . Alcohol Use: Not on file    Review of Systems  Constitutional:       Per HPI, otherwise negative  HENT:       Per HPI, otherwise negative  Respiratory:       Per HPI, otherwise negative  Cardiovascular:       Per HPI, otherwise negative  Gastrointestinal: Negative for vomiting.  Endocrine:       Negative aside from HPI  Genitourinary:       Neg aside from HPI   Musculoskeletal:       Per HPI, otherwise negative  Skin: Negative.   Neurological: Positive for weakness. Negative for syncope.      Allergies  Review of patient's allergies indicates no known allergies.  Home Medications   Prior to Admission medications   Not on File   BP 106/55 mmHg  Pulse 132  Temp(Src) 104.8 F (40.4 C) (Rectal)  Resp 22  SpO2 98% Physical Exam  Constitutional: He is oriented to person, place, and time. He appears well-developed. He appears ill. He appears distressed.  HENT:  Head: Normocephalic and atraumatic.  Eyes: Conjunctivae and EOM are normal.  Neck:  Neck is supple, and the patient moves in all  dimensions freely  Cardiovascular: An irregularly irregular rhythm present. Tachycardia present.   Pulmonary/Chest: Effort normal. No stridor. Tachypnea noted. No respiratory distress. He has decreased breath sounds.  Abdominal: Soft. He exhibits no distension. There is no tenderness. There is no rigidity and no guarding.  Musculoskeletal: He exhibits no edema.  Neurological: He is alert and oriented to person, place, and time.  Skin: Skin is warm and dry.  Psychiatric: He has a normal mood and affect.  Nursing note and vitals reviewed.   ED Course  Procedures (including critical care time) Labs Review Labs Reviewed  CBC WITH DIFFERENTIAL - Abnormal; Notable for the following:    WBC 15.7 (*)    RBC 4.16 (*)    Hemoglobin 12.5 (*)    HCT 38.0 (*)    Platelets 148 (*)    Neutrophils Relative % 95 (*)    Neutro Abs 14.9 (*)    Lymphocytes Relative 2 (*)    Lymphs Abs 0.3 (*)    All other components within normal limits  COMPREHENSIVE METABOLIC PANEL - Abnormal; Notable for the following:    Glucose, Bld 105 (*)    Creatinine, Ser 1.52 (*)    Total Bilirubin 1.8 (*)    GFR calc non Af Amer 45 (*)    GFR calc Af Amer 52 (*)  All other components within normal limits  URINALYSIS, ROUTINE W REFLEX MICROSCOPIC - Abnormal; Notable for the following:    Color, Urine AMBER (*)    Ketones, ur 15 (*)    Protein, ur 30 (*)    All other components within normal limits  URINE MICROSCOPIC-ADD ON - Abnormal; Notable for the following:    Bacteria, UA FEW (*)    All other components within normal limits  I-STAT CG4 LACTIC ACID, ED - Abnormal; Notable for the following:    Lactic Acid, Venous 3.53 (*)    All other components within normal limits  URINE CULTURE  CULTURE, BLOOD (ROUTINE X 2)  CULTURE, BLOOD (ROUTINE X 2)  TROPONIN I  I-STAT CG4 LACTIC ACID, ED    Imaging Review Dg Chest 2 View  07/08/2014   CLINICAL DATA:  Altered mental status and fever for 2 days  EXAM: CHEST  2  VIEW  COMPARISON:  PA and lateral chest of July 16, 2004  FINDINGS: The lungs are mildly hyperinflated but clear. The heart and pulmonary vascularity are normal. The permanent pacemaker is in appropriate position radiographically. There is no pleural effusion or pneumothorax. The observed bony thorax is unremarkable.  IMPRESSION: Hyperinflation consistent with reactive airway disease or COPD. There is no evidence of pneumonia, CHF, or other acute cardiopulmonary abnormality.   Electronically Signed   By: David  Swaziland   On: 07/08/2014 09:44   Ct Head Wo Contrast  07/08/2014   CLINICAL DATA:  68 year old male with altered mental status. Fever for 2 days. Initial encounter.  EXAM: CT HEAD WITHOUT CONTRAST  TECHNIQUE: Contiguous axial images were obtained from the base of the skull through the vertex without intravenous contrast.  COMPARISON:  None.  FINDINGS: No intracranial hemorrhage.  No CT evidence of large acute infarct.  No hydrocephalus.  Cerebellar tonsils minimally low lying.  No intracranial mass lesion noted on this unenhanced exam.  Partial opacification inferior aspect left sphenoid sinus.  Partially empty sella.  Orbital structures unremarkable.  IMPRESSION: No intracranial hemorrhage.  No CT evidence of large acute infarct.  Cerebellar tonsils minimally low lying.  No intracranial mass lesion noted on this unenhanced exam.  Partial opacification inferior aspect left sphenoid sinus.   Electronically Signed   By: Bridgett Larsson M.D.   On: 07/08/2014 09:47     EKG Interpretation   Date/Time:  Tuesday July 08 2014 08:51:28 EST Ventricular Rate:  127 PR Interval:    QRS Duration: 92 QT Interval:  260 QTC Calculation: 378 R Axis:   82 Text Interpretation:  Age not entered, assumed to be  68 years old for  purpose of ECG interpretation Atrial flutter with predominant 2:1 AV block  Low voltage, precordial leads Borderline repolarization abnormality Atrial  fibrillation with rapid  ventricular response Artifact Abnormal ekg  Confirmed by Gerhard Munch  MD ([id number]) on 07/08/2014 9:26:26 AM     EMS rhythm strip shows atrial fibrillation, rate 143, abnormal  Initial lactic acid 4  Update: after initial fluids, patient remained hypotensive.  On repeat exam the patient has no focal pain.patient's heart rate is now decreased somewhat.  Blood pressure remains low. Patient is receiving his second liter of IV fluid resuscitation.  Labs notable for leukocytosis, renal dysfunction, left shift.  1:13 PM Patient's temperature has resolved.  Urinalysis demonstrates mild ketones.  1:13 PM Repeat lactic acid is normal  BP 90/50 after 2L NS  He remains slow of speech, but states that he feels better. Heart  rates now 70, sinus rhythm MDM   Final diagnoses:  Altered mental status  atrial fibrillation, rapid ventricular response. sepsis  Patient presents with concern of fever, altered mental status, possibly malodorous urine. Patient has multiple medical issues, including arrhythmia, COPD. On initial exam the patient is febrile, tachycardic, dysrhythmic, hypotensive, CONCERNING for sepsis. Patient's evaluation is somewhat reassuring with clearing of his lactic acidosis, resolution of his fever, improvement of his blood pressure, but after 2 L fluid resuscitation, patient remained hypotensive. Patient received broad-spectrum antibiotics, continue to receive the remainder of his 30 mL/kg sepsis bolus and was admitted for further evaluation and management.     Gerhard Munchobert Avary Pitsenbarger, MD 07/08/14 1325  CRITICAL CARE Performed by: Gerhard MunchLOCKWOOD, Hester Joslin Total critical care time: 45 Critical care time was exclusive of separately billable procedures and treating other patients. Critical care was necessary to treat or prevent imminent or life-threatening deterioration. Critical care was time spent personally by me on the following activities: development of treatment plan with patient  and/or surrogate as well as nursing, discussions with consultants, evaluation of patient's response to treatment, examination of patient, obtaining history from patient or surrogate, ordering and performing treatments and interventions, ordering and review of laboratory studies, ordering and review of radiographic studies, pulse oximetry and re-evaluation of patient's condition.   Gerhard Munchobert Toby Ayad, MD 07/08/14 1326

## 2014-07-09 ENCOUNTER — Inpatient Hospital Stay (HOSPITAL_COMMUNITY): Payer: Medicare HMO

## 2014-07-09 DIAGNOSIS — L03115 Cellulitis of right lower limb: Secondary | ICD-10-CM

## 2014-07-09 DIAGNOSIS — IMO0001 Reserved for inherently not codable concepts without codable children: Secondary | ICD-10-CM | POA: Clinically undetermined

## 2014-07-09 DIAGNOSIS — M7989 Other specified soft tissue disorders: Secondary | ICD-10-CM

## 2014-07-09 DIAGNOSIS — G9341 Metabolic encephalopathy: Secondary | ICD-10-CM

## 2014-07-09 DIAGNOSIS — L03116 Cellulitis of left lower limb: Secondary | ICD-10-CM | POA: Clinically undetermined

## 2014-07-09 DIAGNOSIS — R4182 Altered mental status, unspecified: Secondary | ICD-10-CM | POA: Insufficient documentation

## 2014-07-09 DIAGNOSIS — I369 Nonrheumatic tricuspid valve disorder, unspecified: Secondary | ICD-10-CM

## 2014-07-09 LAB — CBC
HCT: 32.6 % — ABNORMAL LOW (ref 39.0–52.0)
HEMOGLOBIN: 10.5 g/dL — AB (ref 13.0–17.0)
MCH: 29.2 pg (ref 26.0–34.0)
MCHC: 32.2 g/dL (ref 30.0–36.0)
MCV: 90.8 fL (ref 78.0–100.0)
PLATELETS: 132 10*3/uL — AB (ref 150–400)
RBC: 3.59 MIL/uL — ABNORMAL LOW (ref 4.22–5.81)
RDW: 13.9 % (ref 11.5–15.5)
WBC: 12.4 10*3/uL — ABNORMAL HIGH (ref 4.0–10.5)

## 2014-07-09 LAB — URINE CULTURE
COLONY COUNT: NO GROWTH
Culture: NO GROWTH

## 2014-07-09 LAB — COMPREHENSIVE METABOLIC PANEL
ALT: 14 U/L (ref 0–53)
ANION GAP: 11 (ref 5–15)
AST: 23 U/L (ref 0–37)
Albumin: 2.8 g/dL — ABNORMAL LOW (ref 3.5–5.2)
Alkaline Phosphatase: 47 U/L (ref 39–117)
BUN: 24 mg/dL — AB (ref 6–23)
CALCIUM: 7.8 mg/dL — AB (ref 8.4–10.5)
CO2: 21 meq/L (ref 19–32)
Chloride: 106 mEq/L (ref 96–112)
Creatinine, Ser: 1.5 mg/dL — ABNORMAL HIGH (ref 0.50–1.35)
GFR, EST AFRICAN AMERICAN: 53 mL/min — AB (ref >90)
GFR, EST NON AFRICAN AMERICAN: 45 mL/min — AB (ref >90)
GLUCOSE: 117 mg/dL — AB (ref 70–99)
Potassium: 4.1 mEq/L (ref 3.7–5.3)
Sodium: 138 mEq/L (ref 137–147)
Total Bilirubin: 1.8 mg/dL — ABNORMAL HIGH (ref 0.3–1.2)
Total Protein: 5.4 g/dL — ABNORMAL LOW (ref 6.0–8.3)

## 2014-07-09 LAB — MRSA PCR SCREENING: MRSA BY PCR: NEGATIVE

## 2014-07-09 LAB — TSH: TSH: 8.4 u[IU]/mL — AB (ref 0.350–4.500)

## 2014-07-09 MED ORDER — FUROSEMIDE 20 MG PO TABS
20.0000 mg | ORAL_TABLET | Freq: Once | ORAL | Status: DC
  Filled 2014-07-09: qty 1

## 2014-07-09 MED ORDER — ASPIRIN EC 325 MG PO TBEC: 325.0000 mg | DELAYED_RELEASE_TABLET | Freq: Every day | ORAL | Status: DC

## 2014-07-09 MED ORDER — AMIODARONE HCL 100 MG PO TABS
100.0000 mg | ORAL_TABLET | Freq: Every day | ORAL | Status: DC
  Administered 2014-07-09 – 2014-07-10 (×2): 100 mg via ORAL
  Filled 2014-07-09 (×2): qty 1

## 2014-07-09 MED ORDER — DIPHENHYDRAMINE HCL 50 MG/ML IJ SOLN
INTRAMUSCULAR | Status: AC
  Filled 2014-07-09: qty 1

## 2014-07-09 MED ORDER — SODIUM CHLORIDE 0.9 % IV SOLN: INTRAVENOUS | Status: DC

## 2014-07-09 MED ORDER — METOPROLOL TARTRATE 50 MG PO TABS
50.0000 mg | ORAL_TABLET | Freq: Two times a day (BID) | ORAL | Status: DC
  Administered 2014-07-09: 50 mg via ORAL
  Filled 2014-07-09 (×2): qty 1

## 2014-07-09 MED ORDER — AMIODARONE HCL IN DEXTROSE 360-4.14 MG/200ML-% IV SOLN
INTRAVENOUS | Status: AC
  Filled 2014-07-09: qty 200

## 2014-07-09 MED ORDER — APIXABAN 5 MG PO TABS
5.0000 mg | ORAL_TABLET | Freq: Two times a day (BID) | ORAL | Status: DC
  Administered 2014-07-09 – 2014-07-15 (×12): 5 mg via ORAL
  Filled 2014-07-09 (×15): qty 1

## 2014-07-09 MED ORDER — METOPROLOL TARTRATE 100 MG PO TABS
100.0000 mg | ORAL_TABLET | Freq: Two times a day (BID) | ORAL | Status: DC
  Administered 2014-07-09 – 2014-07-12 (×5): 100 mg via ORAL
  Administered 2014-07-12: 50 mg via ORAL
  Filled 2014-07-09 (×9): qty 1

## 2014-07-09 NOTE — Progress Notes (Signed)
Resident to bedside. HR currently 121 Afib

## 2014-07-09 NOTE — Progress Notes (Signed)
Pt had BM in toilet, noted small-mod amount of blood in toilet and on tissue pt wiping with. Pt states he has hemorrhoids and "they bleed like that sometimes".  Assessed pt's hemorrhoids, fairly large, no bleeding noted at time assessed.

## 2014-07-09 NOTE — Progress Notes (Signed)
Current HR 105-118 afib, resting in bed without c/o.

## 2014-07-09 NOTE — Discharge Summary (Signed)
[facility] Discharge Summary  Patient name: Edward [name] Medical record number: [id number] Date of birth: February 16, 1946 Age: 68 y.o. Gender: male Date of Admission: 07/08/2014  Date of Discharge: 07/15/2014 Admitting Physician: Dickie La, MD  Primary Care Provider: [facility], [place]) Consultants: cardiology, IR  Indication for Hospitalization: sepsis, AMS  Discharge Diagnoses/Problem List:  - atrial fibrillation - sepsis with cellulitis as source - BPH - Sick euthyroid w/ previous hypothyroidism  Disposition: to home  Discharge Condition: stable  Brief Hospital Course:  Edward [name] is a 68 y.o. male who presented with sepsis and AMS . PMH is significant for sinoatrial node dysfunction with pacemaker, atrial fibrillation, BPH, HTN, anemia. His hospital course is outlined by problem below:  # Sepsis: On admission, met SIRS criteria but did not have clear source of infection. Empirically treated with vancomycin and zosyn. With second dose of zosyn he developed a rash on abdomen (but did not have any respiratory symptoms) and was switched to vanc/aztreonam. On day 2 of hospitalization, the minimal amount of erythema that he had on his left ankle had worsened to a clear cellulitis and was presumed to be his source. On day 3, cellulitis was much improved, he was afebrile with a normal white count, and blood cultures still had no growth. His antibiotics were therefore narrowed to oral clindamycin. He remained afebrile and his cellulitis continued to improve. On day of discharge, he had completed 7/7 days of antibiotics, his cellulitis was resolved, and he had a normal white count and VS within normal limits.  # Atrial fibrillation/atrial flutter, SSS: On presentation to ED was in atrial fibrillation with RVR, but after receiving fluids he returned to normal sinus rhythm. His home medications of amiodarone and metoprolol were  initially held given his low BPs, but after persistently being in a fib they were promptly restarted. Cardiology was consulted and per their recommendation his amiodarone dose was increased and he was started on Eliquis for stroke prevention. An echo was completed indicating normal EF of 55-60% and no abnormal wall motion. Given persistent a fib with rvr in 110s-120s, a TEE guided cardioversion was attempted on 11/6. He initially went into NSR, but returned to a fib with RVR the afternoon of 11/6. He was started on an amiodarone drip and then transitioned to po amiodarone at a higher dose of 476m BID when a fib resolved. He remained in NSR for the next 36 hours and was then safe for discharge. On day of discharge he was in NSR and was asymptomatic with no palpitations, CP, or SOB.   # Hypothyroidism: On synthroid in past, but upon admission was not currently taking as had been euthyroid when last checked (01/30/2014). TSH elevated here, however T3 and T4 normal, indicating likely sick euthyroid. Given that he is on amiodarone, though, would recheck thyroid panel on outpatient follow-up.  # Anemia, normocytic: Iron studies and vit B12 and folate checked while in hospital.  MCV indicates normocytic. Normal ferritin with low iron. B12 and folate normal. Indicates most likely combined iron deficiency and anemia of chronic disease. Will continue iron.  Issues for Follow Up:  - Atrial fibrillation, resolved upon discharge, on higher dose of po amiodarone (4061mBID) and lower dose of po metoprolol (5042mID) given soft BPs. The amiodarone will need to be titrated down and metoprolol titrated up. Cards recs: transitioned to PO amiodarone 400 mg BID for 1 week then decrease to daily x 1 week  before returning to home dose of ~100-200 mg daily. Consider increasing lopressor from 50 mg BID to 100 mg BID (home dose) as amiodarone dose decreases.  - Monitor left lower extremity bulla for resolution/ monitor left lower  extremity for infection if bulla un-roofs  - Sick euthyroid: Elevated TSH but normal T3 and T4 during hospitalization. Given on amiodarone, recheck thyroid panel on outpatient follow-up.  Significant Procedures:  - LP (37/1/06), no complications   Significant Labs and Imaging:  No results for input(s): WBC, HGB, HCT, PLT in the last 168 hours. No results for input(s): NA, K, CL, CO2, GLUCOSE, BUN, CREATININE, CALCIUM, MG, PHOS, ALKPHOS, AST, ALT, ALBUMIN, PROTEIN in the last 168 hours.  Invalid input(s): TBILI Iron/TIBC/Ferritin/ %Sat    Component Value Date/Time   IRON 25* 07/11/2014 0240   TIBC 263 07/11/2014 0240   FERRITIN 115 07/11/2014 0240   IRONPCTSAT 10* 07/11/2014 0240    Urine cx: negative  Blood cx: negative  CSF:  Glucose: 63 Total protein: 55 RBC count: 1 WBC count: 2 Segmented neutro: none Appearance: clear Color: colorless  CSF gram stain: WBC PRESENT, PREDOMINANTLY MONONUCLEAR  NO ORGANISMS SEEN   CSF culture: negative   Imaging/Diagnostic Tests: CXR (07/08/14): IMPRESSION: Hyperinflation consistent with reactive airway disease or COPD. There is no evidence of pneumonia, CHF, or other acute cardiopulmonary abnormality.  CXR (07/09/14): IMPRESSION: Superimposed on mild bilateral atelectasis, there is interval development of retrocardiac opacity. This is favored to represent atelectasis as well although the possibility of developing pneumonitis is not excluded. Interval development of trace bilateral pleural effusions.  2D Echo:  Study Conclusions - Left ventricle: The cavity size was normal. Wall thickness was normal. Systolic function was normal. The estimated ejection fraction was in the range of 55% to 60%. Wall motion was normal; there were no regional wall motion abnormalities. - Pulmonary arteries: Systolic pressure was mildly increased. PA peak pressure: 31 mm Hg (S).  Results/Tests Pending at Time of Discharge:  none  Discharge Medications:    Medication List    STOP taking these medications        aspirin 81 MG tablet      TAKE these medications        amiodarone 400 MG tablet  Commonly known as:  PACERONE  Take 1 tablet twice daily for 1 week and then 1 tablet daily for 1 week     amiodarone 200 MG tablet  Commonly known as:  PACERONE  Take 0.5 tablets (100 mg total) by mouth 2 (two) times daily. Begin after you stop the 400 mg pill     apixaban 5 MG Tabs tablet  Commonly known as:  ELIQUIS  Take 1 tablet (5 mg total) by mouth 2 (two) times daily.     ferrous sulfate 325 (65 FE) MG tablet  Take 1 tablet (325 mg total) by mouth daily with breakfast.     finasteride 5 MG tablet  Commonly known as:  PROSCAR  Take 5 mg by mouth every evening.     KLOR-CON M10 10 MEQ tablet  Generic drug:  potassium chloride  Take 10 mEq by mouth daily.     metoprolol 50 MG tablet  Commonly known as:  LOPRESSOR  Take 1 tablet (50 mg total) by mouth 2 (two) times daily.     tamsulosin 0.4 MG Caps capsule  Commonly known as:  FLOMAX  Take 0.4 mg by mouth daily.     torsemide 5 MG tablet  Commonly known as:  DEMADEX  Take 1 tablet (5 mg total) by mouth daily.     vitamin B-12 1000 MCG tablet  Commonly known as:  CYANOCOBALAMIN  Take 1,000 mcg by mouth daily.        Discharge Instructions: Please refer to Patient Instructions section of EMR for full details.  Patient was counseled important signs and symptoms that should prompt return to medical care, changes in medications, dietary instructions, activity restrictions, and follow up appointments.   Follow-Up Appointments: Follow-up Information    Follow up with Isaias Cowman, MD On 07/17/2014.   Specialty:  Internal Medicine   Why:  hospital followup appointment and appointment with [employer] for pacer interrogation at 11am   Contact information:   [place] [id number] [phone]       Follow-up  Information    Follow up with Isaias Cowman, MD On 07/17/2014.   Specialty:  Internal Medicine   Why:  hospital followup appointment and appointment with [employer] for pacer interrogation at 11am   Contact information:   [place] [id number] [place], [place]   I agree with the medical student note above and have edited it as necessary. I formulated the plan and performed my own physical exam which are documented above.  Beverlyn Roux, MD, MPH [facility] PGY-1 07/21/2014 2:34 PM

## 2014-07-09 NOTE — Progress Notes (Signed)
*  PRELIMINARY RESULTS* Vascular Ultrasound Lower extremity venous duplex has been completed.  Preliminary findings: no evidence of DVT   Farrel DemarkJill Eunice, RDMS, RVT  07/09/2014, 6:47 PM

## 2014-07-09 NOTE — Consult Note (Addendum)
Patient ID: Edward [name] MRN: [id number] DOB/AGE: Jan 17, 1946 68 y.o.  Admit date: 07/08/2014 Referring Physician: Family Medicine Service Primary Cardiologist[facility] Reason for Consultation: Afib management   HPI:   Edward [name] is a 68 yo man with pmh of afib/flutter, sick sinus syndrome with pacemaker, LV dysfunction, hyperlipidemia, chronic anemia, amiodarone-induced hypothyroidism, CKD Stage III, BPH, and GERD who we are consulted for in regards to atrial fibrillation.   He was admitted on 07/08/14 for two-day history of fever, chills, confusion with delayed response to answering questions, and worsening left LE pain, swelling, erythema, and warmth. He was found to have SIRS without clear source of infection. Workup including CT head and LP were negative. CXR revealed bilateral mild pleural effusions. It was thought his source of infection was due to left LE cellulitis and he was started on IV antibiotics initially vanc/zosyn then changed to aztreonam and vancomycin. Blood cultures have been negative to date. He reports left LE swelling since February of this year and given torsemide 10 mg to take for swelling which did not provide much relief. He denies recent injury, fall, or history of DVT/PE. On admission he was found to have afib with RVR that later resolved. He was also found to be hypotensive and his home amiodarone and lopressor were held. Today he was found to be in atrial fibrillation with RVR with HR in 200'[name] and symptomatic with lightheadedness. He was restarted on amiodarone 100 mg daily and 50 mg BID of his home lopressor (100 mg BID). He was given a fluid bolus and currently on IV fluids (NS 75 mL/hr) for soft blood pressures. EKG on admission revealed 2:1 atrial flutter. Troponin x 1 was negative. He has an 2D-echo in progress. He reports chronic DOE, orthopnea (1pillow), and chronic pedal edema.   His last cardiologist appointment was September with [facility]. He is on lopressor 100 BID that was increased from 50 mg BID after recent hospitalization for afib. He is also on amiodarone 100 mg daily which was reduced from 200 mg daily in May of this year due to increased TSH (was 5.3 in May). His current TSH level is elevated at 8.4. He is not on synthroid at home. He denies symptoms of hypothyroidism. He has been compliant with taking aspirin 325 mg daily for stroke prevention. He has never been on coumadin or xarelto in the past. He denies history of TIA/CVA, recent fall, or head trauma. He reports having hemorrhoidal bleeding at times. He reports having pacemaker placed May 29, 2008 without prior infection and currently denies any chest tenderness at the site, warmth, or erythema.   He currently reports lightheadedness but denies chest pain, palpitation, dyspnea, pleuritic chest pain, or syncope. Per family member he is at baseline mental status. He denies weakness, paresthesias, speech, or vision change.    Review of systems complete and found to be negative unless listed above  Past Medical History  Diagnosis Date  . Sinoatrial node dysfunction   . Atrial fibrillation and flutter   . Anemia   . GERD (gastroesophageal reflux disease)   . HTN (hypertension)   . Internal hemorrhoids with complication     Family History  Problem Relation Age of Onset  . Leukemia Mother     History   Social History  . Marital Status: Married    Spouse Name: N/A    Number of Children: N/A  . Years of Education: N/A   Occupational History  .  Not on file.   Social History Main Topics  . Smoking status: Former Smoker    Types: Cigarettes    Quit date: 11/05/1965  . Smokeless tobacco: Never Used  . Alcohol Use: No  . Drug Use: Not on file  . Sexual Activity: Not on file   Other Topics Concern  . Not on file   Social History Narrative  . No narrative on file    Past Surgical History  Procedure Laterality Date  . Pacemaker  placement    . Hemorroidectomy       Prescriptions prior to admission  Medication Sig Dispense Refill Last Dose  . aspirin 81 MG tablet Take 162 mg by mouth 2 (two) times daily.    07/08/2014 at 0800  . finasteride (PROSCAR) 5 MG tablet Take 5 mg by mouth every evening.    07/07/2014 at Unknown time  . KLOR-CON M10 10 MEQ tablet Take 10 mEq by mouth daily.    07/07/2014 at Unknown time  . metoprolol (LOPRESSOR) 50 MG tablet Take 100 mg by mouth 2 (two) times daily.    07/08/2014 at 0800  . PACERONE 200 MG tablet Take 100 mg by mouth daily.    07/08/2014 at Unknown time  . tamsulosin (FLOMAX) 0.4 MG CAPS capsule Take 0.4 mg by mouth daily.    07/07/2014 at 1400  . torsemide (DEMADEX) 10 MG tablet Take 10 mg by mouth daily.    07/07/2014 at Unknown time  . vitamin B-12 (CYANOCOBALAMIN) 1000 MCG tablet Take 1,000 mcg by mouth daily.   07/08/2014 at Unknown time    Physical Exam: Vitals:   Filed Vitals:   07/09/14 0600 07/09/14 0800 07/09/14 1200 07/09/14 1607  BP: 100/42 129/59 98/52 112/64  Pulse: 119 102 130 119  Temp:   98.1 F (36.7 C) 97.9 F (36.6 C)  TempSrc:  Oral Oral Oral  Resp: 24 22 20 29   Height:      Weight:      SpO2: 92% 97% 100% 99%   I&O'[name]:   Intake/Output Summary (Last 24 hours) at 07/09/14 1637 Last data filed at 07/09/14 1400  Gross per 24 hour  Intake 3207.5 ml  Output    550 ml  Net 2657.5 ml   Physical exam:  [place]/AT EOMI No JVD, No carotid bruit Irregularly irregular rhythm with tachycardia Normal S1S2. Pacemaker with no overlying tenderness or signs of infection. Bilateral crackles worse on left. No wheezing Soft. NT, nondistended Left LE with warmth, erythema, swelling, and tenderness to touch.   No focal motor or sensory deficits Slow response to questions (per family at baseline)  Labs:   Lab Results  Component Value Date   WBC 12.4* 07/09/2014   HGB 10.5* 07/09/2014   HCT 32.6* 07/09/2014   MCV 90.8 07/09/2014   PLT 132* 07/09/2014     Recent Labs Lab 07/09/14 0339  NA 138  K 4.1  CL 106  CO2 21  BUN 24*  CREATININE 1.50*  CALCIUM 7.8*  PROT 5.4*  BILITOT 1.8*  ALKPHOS 47  ALT 14  AST 23  GLUCOSE 117*   Lab Results  Component Value Date   TROPONINI <0.30 07/08/2014   No results found for: CHOL No results found for: HDL No results found for: LDLCALC No results found for: TRIG No results found for: CHOLHDL No results found for: LDLDIRECT    Radiology: Reviewed EKG: Reveiewed  ASSESSMENT: Pt is a 68 yo man with pmh of afib/flutter, sick sinus syndrome with  pacemaker, LV dysfunction, hyperlipidemia, chronic anemia, amiodarone-induced hypothyroidism, CKD Stage III, BPH, and GERD who was admitted on 11/3 with sepsis of unknown etiology found to have atrial fibrillation not on [facility] therapy.   Plan:  Recurrent Atrial Fibrillation - Currently in AF with tachycardia. Most likely due to meds being held on admission. Continue PO amiodarone 100 mg daily and increase metoprolol  from 50 mg BID to 100 mg BID (home dose). Pt was at home on AP therapy with aspirin 325 mg daily, never on [facility] therapy in the past. Pt with CHADS2 score of 2 (age and LV dysfunction). Risk and benefits of starting [facility] therapy discussed with patient. He chose to start NOAC, eliquis which we will start 5 mg BID (per pharmacy). He is at increased risk of thromobembolic event and his risk of bleeding is low-moderate (reports occasional internal hemorrhoidal bleeding and denies falls at home). He is reliable in taking medications and lives with his wife at home. Will discontinue aspirin 325 mg daily.  Left LE DVT vs Cellulitis - Will order doppler LE to r/o DVT in setting of afib without AC therapy. Antibiotics per primary team, currently on vancomycin and aztreonam. Blood cultures negative to date.  History of LV Dysfunction - 2D-echo to be performed. Will d/c IV fluids in setting of trace bilateral pleural effusions, crackles on exam, and improvement of  blood pressure. IV lasix as needed. Reports symptoms of CHF.    Sick Sinus Syndrome [name]/p implanted dual chamber pacemaker - Pacemaker placed in 2009, no signs of infection of implanted pacemaker. Blood cultures to date negative.   High TSH in setting of amiodarone use -  Will obtain T3 & T4. Continue PO amiodarone 100 mg daily (was previously on 200 mg daily). Consider starting synthroid. CKD Stage 3 - Renal function appears to be at baseline. Continue to monitor.   Otis Brace, MD  PGY-II IMTS Pager: [phone] 07/09/2014, 4:37 PM    I have examined the patient and reviewed assessment and plan and discussed with patient.  Agree with above as stated.  Follow blood cultures. Doubt pacer infection if no bacteremia.  R/o DVT.  Add eliquis for stroke prevention.  D/c aspirin.  Hbg decreased today.  Follow hemoccult for any blood loss.   Increase metoprolol for rate control.  Could consider digoxin.  He appears healthy; except that speech is somewhat slow.  No bleeding issues that he mentions except hemorrhoids.  Would benefit from stroke risk reduction.  Edward [name].

## 2014-07-09 NOTE — Clinical Documentation Improvement (Signed)
Please clarify if  AMS, confusion in  Setting of sepsis, Atrial Flutter, SSS can be further specified as one of the diagnoses listed below and document in pn or d/c summary.   Possible Clinical Conditions?  Encephalopathy (describe type if known)                       Anoxic                       Septic                       Alcoholic                        Hepatic                       Hypertensive                       Metabolic                       Toxic  Other Condition Cannot Clinically Determine   Supporting Information:  Risk Factors: Signs & Symptoms: Confusion, AMS per ED/HP note 07/08/14 Fevers per H/P  Diagnostics: Treatment: Monitoring Vancomycin Zosyn  Thank You, Keeshia Sanderlin J Linn Goetze ,RN Clinical Documentation Specialist:  [phone]  [facility] Health Information Management 

## 2014-07-09 NOTE — Progress Notes (Signed)
Family Medicine Teaching Service Daily Progress Note Intern Pager: [phone]  Patient name: Edward [name] Medical record number: [id number] Date of birth: Sep 29, 1945 Age: 68 y.o. Gender: male  Primary Care Provider: No primary care provider on file.Dr. Ouida Sills at [facility] Consultants: none Code Status: FULL  Pt Overview and Major Events to Date:  07/08/14: patient admitted with sirs without clear source; got LP by IR that had neg gram stain; blood, urine, and CSF cx pending; started vanc/zosyn then zosyn switched to aztreonam due development of rash  Assessment and Plan: Edward [name] is a 68 y.o. male presenting with sepsis and AMS . PMH is significant for sinoatrial node dysfunction with pacemaker, a fib, LV dysfunction, GERD, HTN, hypothyroidism.  # Sepsis: On admission met SIRS criteria with fever to 40.4, tachycardic to 132, leukocytosis to 15, SBP of 83, hyperlactatemia of 3.53, and AMS. CXR clear on 11/3, no pneumonia development on repeat on 11/4. Urine clean. CSF gram stain negative. Swelling/erythema/warmth of left ankle worsened today-->this cellulitis is suspected to be source at this time. Still some concern for central process given mental slowing, however improved today. - on vanc/aztreonam (had allergic reaction--rash--to zosyn) - CSF and blood cxs no growth to date, continue to follow - f/u urine cx - echo today to evaluate pacer and heart for vegetations  -orthopantogram to evaluate for source of infection given poor dentition - marked extension of erythema, watch closely for further extension  - monitor mental status and BP closely (both improved) - monitor on tele  - given crackles on exam and pleural effusions on CXR, decreased fluids: NS at 50/hr  # A fib/flutter: Intermittently in a fib with RVR. Last echo unknown, but has diagnosis of LV dysfunction. Home meds include amiodarone and metoprolol.  - Restart amio and metop (at lower dose of 50 BID) today  given improved BPs and given he has intermittently been in a fib this morning - According to most recent cardiology note, only anti-platelet was aspirin 325 daily, no anticoagulation, will call physician today to suggest starting Xarelto or other given increased stroke risk - monitor on tele - echo to evaluate cardiac function - consult cardiology  #Sick sinus syndrome: has pacemaker. Need to consider potential for this as site of infection - echo today - consult cardiology, see above  #Hypothyroidism: on synthroid in past. Unsure if was on it prior to admission. - TSH today  FEN/GI: heart healthy diet, NS @ 94m/hr PPx: subq heparin  Disposition: pending culture results and clinical improvement   Subjective:  Patient states he feels better today and that his thinking is "sharper" today. No more palpitations. Does complain of some belly pain in his upper abdomen. When asked if his left ankle is bothering him, he states that it started bothering him when he "turned 38", but that it is not worse now. No CP, SOB, palpitations.  Patient notes that he has a hernia in his lower right abdomen that has been there "since 1995". He states he is having regular BMs (last one was yesterday morning), no blood in stool.  Objective: Temp:  [98.1 F (36.7 C)-99.1 F (37.3 C)] 99.1 F (37.3 C) (11/04 0403) Pulse Rate:  [56-127] 102 (11/04 0800) Resp:  [12-25] 22 (11/04 0800) BP: (83-129)/(38-71) 129/59 mmHg (11/04 0800) SpO2:  [92 %-100 %] 97 % (11/04 0800) Weight:  [95.255 kg (210 lb)] 95.255 kg (210 lb) (11/03 1144) Physical Exam: General: NAD, sitting up in bed eating breakfast Cardiovascular: irregularly irregular  rhythm, normal rate, no murmurs, rubs, or gallops Respiratory: normal work of breathing, CTAB except for crackles in bilateral bases Abdomen: NABS, non-distended, non-tender to palpation; defect in abdominal wall muscles with small hernia palpated in RLQ, reducible, nontender, no  HSM Extremities: left lower extremity with warmth, edema and erythema extending from ankle to mid-shin with some streaks of erythema going superiorly, non-tender to palpation; right lower extremity non-erythematous, no edema. DP pulses 2+ bilaterally  Laboratory:  Recent Labs Lab 07/08/14 1005 07/09/14 0339  WBC 15.7* 12.4*  HGB 12.5* 10.5*  HCT 38.0* 32.6*  PLT 148* 132*    Recent Labs Lab 07/08/14 1005 07/09/14 0339  NA 140 138  K 4.2 4.1  CL 103 106  CO2 22 21  BUN 18 24*  CREATININE 1.52* 1.50*  CALCIUM 8.5 7.8*  PROT 6.5 5.4*  BILITOT 1.8* 1.8*  ALKPHOS 57 47  ALT 11 14  AST 20 23  GLUCOSE 105* 117*   Urine cx: result pending  Blood cx: no growth to date x2  CSF:  Glucose: 63 Total protein: 55 RBC count: 1 WBC count: 2 Segmented neutro: none Appearance: clear Color: colorless  CSF gram stain: WBC PRESENT, PREDOMINANTLY MONONUCLEAR  NO ORGANISMS SEEN   CSF culture: no growth to date   Imaging/Diagnostic Tests: CXR (07/08/14): IMPRESSION: Hyperinflation consistent with reactive airway disease or COPD. There is no evidence of pneumonia, CHF, or other acute cardiopulmonary abnormality.  CXR (07/09/14): IMPRESSION: Superimposed on mild bilateral atelectasis, there is interval development of retrocardiac opacity. This is favored to represent atelectasis as well although the possibility of developing pneumonitis is not excluded.  Interval development of trace bilateral pleural effusions.  Montel Clock, Med Student 07/09/2014, 9:14 AM [place] Intern pager: [phone], text pages welcome  I agree with the medical student note above and have edited it as necessary. I formulated the plan and performed my own physical exam which are documented above.  Beverlyn Roux, MD, MPH [facility] PGY-1 07/09/2014 2:26 PM

## 2014-07-09 NOTE — Clinical Documentation Improvement (Deleted)
Please clarify if  AMS, confusion in  Setting of sepsis, Atrial Flutter, SSS can be further specified as one of the diagnoses listed below and document in pn or d/c summary.   Possible Clinical Conditions?  Encephalopathy (describe type if known)                       Anoxic                       Septic                       Alcoholic                        Hepatic                       Hypertensive                       Metabolic                       Toxic  Other Condition Cannot Clinically Determine   Supporting Information:  Risk Factors: Signs & Symptoms: Confusion, AMS per ED/HP note 07/08/14 Fevers per H/P  Diagnostics: Treatment: Monitoring Vancomycin Zosyn  Thank You, Enis SlipperLolita J Sharell Hilmer ,RN Clinical Documentation Specialist:  [phone]  [facility]- Health Information Management

## 2014-07-09 NOTE — Progress Notes (Signed)
AT 1250 pt HR up 100-120's, occ 130's afib.  Pt has been in and out afib/SR since early am, rate controlled 80-110's.   Noted MD had already ordered pt's home meds metoprolol and amiodarone, these meds given at 1250.    At 1315 pt HR up to 150-200'scall placed to resident on call at [phone]. Informed of above, asked MD about need to start amiodarone drip, no orders,  await return call and or orders.  SBP low 80's.    Pt assisted back to bed, HR back down to 110-130's Afib.  SBP 130-140's after back to bed.

## 2014-07-09 NOTE — Progress Notes (Signed)
  Echocardiogram 2D Echocardiogram has been performed.  Edward [name] 07/09/2014, 4:53 PM

## 2014-07-09 NOTE — Progress Notes (Signed)
07/09/2014 1400 Utilization review complete. Chart reviewed. Isidoro DonningAlesia Tyrique Sporn RN CCM Case Mgmt

## 2014-07-09 NOTE — Progress Notes (Signed)
Second page to resident concerning HR 130-200 at this time. Pt back to bed from chair.

## 2014-07-10 LAB — T3, FREE: T3, Free: 2.8 pg/mL (ref 2.3–4.2)

## 2014-07-10 LAB — BASIC METABOLIC PANEL
Anion gap: 12 (ref 5–15)
BUN: 18 mg/dL (ref 6–23)
CO2: 18 mEq/L — ABNORMAL LOW (ref 19–32)
Calcium: 8.1 mg/dL — ABNORMAL LOW (ref 8.4–10.5)
Chloride: 109 mEq/L (ref 96–112)
Creatinine, Ser: 1.35 mg/dL (ref 0.50–1.35)
GFR, EST AFRICAN AMERICAN: 60 mL/min — AB (ref >90)
GFR, EST NON AFRICAN AMERICAN: 52 mL/min — AB (ref >90)
Glucose, Bld: 93 mg/dL (ref 70–99)
Potassium: 4.1 mEq/L (ref 3.7–5.3)
SODIUM: 139 meq/L (ref 137–147)

## 2014-07-10 LAB — CBC
HCT: 32.6 % — ABNORMAL LOW (ref 39.0–52.0)
Hemoglobin: 10.9 g/dL — ABNORMAL LOW (ref 13.0–17.0)
MCH: 30.1 pg (ref 26.0–34.0)
MCHC: 33.4 g/dL (ref 30.0–36.0)
MCV: 90.1 fL (ref 78.0–100.0)
PLATELETS: 133 10*3/uL — AB (ref 150–400)
RBC: 3.62 MIL/uL — AB (ref 4.22–5.81)
RDW: 13.9 % (ref 11.5–15.5)
WBC: 9.2 10*3/uL (ref 4.0–10.5)

## 2014-07-10 LAB — T4, FREE: Free T4: 1.08 ng/dL (ref 0.80–1.80)

## 2014-07-10 MED ORDER — AMIODARONE HCL 200 MG PO TABS
300.0000 mg | ORAL_TABLET | Freq: Once | ORAL | Status: AC
  Administered 2014-07-10: 300 mg via ORAL
  Filled 2014-07-10: qty 1

## 2014-07-10 MED ORDER — CLINDAMYCIN HCL 300 MG PO CAPS
450.0000 mg | ORAL_CAPSULE | Freq: Four times a day (QID) | ORAL | Status: DC
  Administered 2014-07-10 – 2014-07-15 (×20): 450 mg via ORAL
  Filled 2014-07-10 (×27): qty 1

## 2014-07-10 MED ORDER — INFLUENZA VAC SPLIT QUAD 0.5 ML IM SUSY
0.5000 mL | PREFILLED_SYRINGE | INTRAMUSCULAR | Status: AC
  Administered 2014-07-11: 0.5 mL via INTRAMUSCULAR
  Filled 2014-07-10: qty 0.5

## 2014-07-10 MED ORDER — CLINDAMYCIN HCL 300 MG PO CAPS: 600.0000 mg | ORAL_CAPSULE | Freq: Four times a day (QID) | ORAL | Status: DC

## 2014-07-10 MED ORDER — AMIODARONE HCL 200 MG PO TABS
400.0000 mg | ORAL_TABLET | Freq: Every day | ORAL | Status: DC
  Administered 2014-07-11 – 2014-07-12 (×2): 400 mg via ORAL
  Filled 2014-07-10 (×3): qty 2

## 2014-07-10 NOTE — Progress Notes (Signed)
PT Cancellation Note  Patient Details Name: Edward [name] MRN: [id number] DOB: 27-Dec-1945   Cancelled Treatment:    Reason Eval/Treat Not Completed: Patient not medically ready;Other (comment) (Pt experiencing Afib with HR between 119-150. PT checked with Pt's nurse who stated she did not want Pt's HR to go back up to 200. Stated Pt's HR was under control this morning and asked PT to come back a little later.) PT will try to check back later to evaluate Pt. Thanks.    [name], [name] Agent, SPT  07/10/2014, 10:26 AM

## 2014-07-10 NOTE — Progress Notes (Signed)
Family Medicine Teaching Service Daily Progress Note Intern Pager: [phone]  Patient name: Edward [name] Medical record number: [id number] Date of birth: 22-Feb-1946 Age: 68 y.o. Gender: male  Primary Care Provider: No primary care provider on file.Dr. Ouida Sills at [facility] Consultants: none Code Status: FULL  Pt Overview and Major Events to Date:  07/08/14: patient admitted with sirs without clear source; got LP by IR that had neg gram stain; blood, urine, and CSF cx pending; started vanc/zosyn then zosyn switched to aztreonam due development of rash  07/09/14: restarted home a fib meds and started on eliquis for anticoag; LE dopplers done to rule out DVT and negative; TTE done showing normal EF  Assessment and Plan: Edward [name] is a 68 y.o. male presenting with sepsis and AMS . PMH is significant for sinoatrial node dysfunction with pacemaker, a fib, LV dysfunction, GERD, HTN, hypothyroidism.  # Sepsis: Improved. Has been afebrile last 24 hours, wbc count normal, symptomatic improvement as well. On admission met SIRS criteria with fever to 40.4, tachycardic to 132, leukocytosis to 15, SBP of 83, hyperlactatemia of 3.53, and AMS. CXR clear on 11/3, no pneumonia development on repeat on 11/4. Urine clean. CSF gram stain negative.  - on vanc/aztreonam (had allergic reaction--rash--to zosyn) - blood cxs no growth to date, continue to follow - CSF cx no growth to date - urine cx no growth to date - TTE: EF of 55-60% - Consider TTE today to evaluate pacemaker for vegetations if blood cultures are positive  -orthopantogram to evaluate for source of infection given poor dentition - marked improvement of cellulitis today - monitor on tele  - given crackles on exam and pleural effusions on CXR, d/c'd fluids and gave 20 of lasix-->improved now - Start IS  # LE erythema/warmth/swelling: Marked improvement today; most likely cellulitis that is responding to IV abx. Is asymptomatic to  patient. LE dopplers negative. - continue to monitor resolution of erythema and warmth  # A fib/flutter: Persistently in a fib. On amio and metop. - Per cards recs, started Eliquis yesterday for anticoagulation for stroke prevention given A fib - monitor on tele - f/u further cardiology recs  #Sick sinus syndrome: has pacemaker. Need to consider potential for this as site of infection (primary or secondary). - TTE if blood cultures positive  #Hypothyroidism: on synthroid in past. Unsure if was on it prior to admission. - TSH 8.400 here, but could be sick euthyroid - recheck TSH in 2-3 months outpatient  FEN/GI: heart healthy diet, SLIV PPx: Eliquis as above  Disposition: pending culture results and further clinical improvement   Subjective:  Patient states he feels better today and is ready to go home. Feels his thinking is "sharper" today. No more palpitations. No left ankle pain at all. No CP, SOB. Had one BM yesterday with small amount of fresh blood. He states he has this occasionally due to his hemorrhoids.   Objective: Temp:  [97.9 F (36.6 C)-99.2 F (37.3 C)] 98 F (36.7 C) (11/05 0751) Pulse Rate:  [84-130] 84 (11/05 0751) Resp:  [10-34] 22 (11/05 0751) BP: (91-132)/(43-79) 132/72 mmHg (11/05 0751) SpO2:  [93 %-100 %] 95 % (11/05 0751) Weight:  [212 lb 11.9 oz (96.5 kg)] 212 lb 11.9 oz (96.5 kg) (11/05 0307) Physical Exam: General: NAD, sitting up in bed eating breakfast Cardiovascular: irregularly irregular rhythm, normal rate, no murmurs, rubs, or gallops Respiratory: normal work of breathing, CTAB except for crackles in bilateral bases Abdomen: NABS, non-distended, non-tender to  palpation; defect in abdominal wall muscles with small hernia palpated in RLQ, reducible, nontender, no HSM Extremities: left lower extremity with slight warmth, mild edema and slight erythema of ankle, non-tender to palpation; right lower extremity non-erythematous, no edema. DP pulses 2+  bilaterally  Laboratory:  Recent Labs Lab 07/08/14 1005 07/09/14 0339 07/10/14 0250  WBC 15.7* 12.4* 9.2  HGB 12.5* 10.5* 10.9*  HCT 38.0* 32.6* 32.6*  PLT 148* 132* 133*    Recent Labs Lab 07/08/14 1005 07/09/14 0339 07/10/14 0250  NA 140 138 139  K 4.2 4.1 4.1  CL 103 106 109  CO2 22 21 18*  BUN 18 24* 18  CREATININE 1.52* 1.50* 1.35  CALCIUM 8.5 7.8* 8.1*  PROT 6.5 5.4*  --   BILITOT 1.8* 1.8*  --   ALKPHOS 57 47  --   ALT 11 14  --   AST 20 23  --   GLUCOSE 105* 117* 93   Urine cx: result pending  Blood cx: no growth to date x2  CSF:  Glucose: 63 Total protein: 55 RBC count: 1 WBC count: 2 Segmented neutro: none Appearance: clear Color: colorless  CSF gram stain: WBC PRESENT, PREDOMINANTLY MONONUCLEAR  NO ORGANISMS SEEN   CSF culture: no growth to date   Imaging/Diagnostic Tests: CXR (07/08/14): IMPRESSION: Hyperinflation consistent with reactive airway disease or COPD. There is no evidence of pneumonia, CHF, or other acute cardiopulmonary abnormality.  CXR (07/09/14): IMPRESSION: Superimposed on mild bilateral atelectasis, there is interval development of retrocardiac opacity. This is favored to represent atelectasis as well although the possibility of developing pneumonitis is not excluded.  Interval development of trace bilateral pleural effusions.  2D Echo:  Study Conclusions - Left ventricle: The cavity size was normal. Wall thickness was normal. Systolic function was normal. The estimated ejection fraction was in the range of 55% to 60%. Wall motion was normal; there were no regional wall motion abnormalities. - Pulmonary arteries: Systolic pressure was mildly increased. PA peak pressure: 31 mm Hg (S).  Montel Clock, Med Student 07/10/2014, 9:02 AM [place] Intern pager: [phone], text pages welcome   I have seen and examined [name] with Student Dr. Carolynn Comment and I agree with her  documentation above. Below is my summary.   S: Feeling better overall, notably feels sharper. Breathing easily.   O: Blood pressure 132/72, pulse 84, temperature 98 F (36.7 C), temperature source Oral, resp. rate 22, height 5' 8.5" (1.74 m), weight 212 lb 11.9 oz (96.5 kg), SpO2 95 %.  Gen: NAD, alert, cooperative with exam HEENT: NCAT CV: RRR, good S1/S2, no murmur Resp: CTABL on anterior exam Abd: SNTND, BS present, no guarding or organomegaly Ext: trace pedal edema BL, LLE with erythema and warmth slightly improved from 1 day ago not exceeding marked boundaries Neuro: Alert and oriented, No gross deficits  A/P  68 y/o male here with Sepsis, initially with unclear source and now seemingly due to LLE cellulitis. He is doing better and we are de-escalating antibiotics today. He continues to have Afib with RVR warranting his Step down status.   Pt is on amio with elevated TSH, Free T3/T4 pending and will plan to f/u with cards concerning this OP.   Laroy Apple, MD [place] Resident, PGY-3 07/10/2014, 11:54 AM

## 2014-07-10 NOTE — Care Management Note (Signed)
    Page 1 of 1   07/14/2014     9:21:04 AM CARE MANAGEMENT NOTE 07/14/2014  Patient:  Edward [name],Edward [name]   Account Number:  [id number]  Date Initiated:  07/09/2014  Documentation initiated by:  [facility]AV[facility],Edward  Subjective/Objective Assessment:   SIRS     Action/Plan:   Anticipated DC Date:     Anticipated DC Plan:  HOME/SELF CARE      DC Planning Services  CM consult      Choice offered to / List presented to:             Status of service:  Completed, signed off Medicare Important Message given?  YES (If response is "NO", the following Medicare IM given date fields will be blank) Date Medicare IM given:  07/14/2014 Medicare IM given by:  Junius CreamerWELL,DEBBIE Date Additional Medicare IM given:   Additional Medicare IM given by:    Discharge Disposition:    Per UR Regulation:  Reviewed for med. necessity/level of care/duration of stay  If discussed at Long Length of Stay Meetings, dates discussed:   07/15/2014    Comments:  07-10-14 4:10pm Johny ShearsSarah Brown,RNBSN - [phone] Request information on elequis cost long term COVER-YES CO-PAY- $ 6.60 TIER- 3 DRUG PRIOR APPROVAL - YES # [phone] PHARMACY: [facility], [facility] AND [facility]  07/09/2014 1400 Utilization review complete. Chart reviewed. Isidoro DonningAlesia Shavis RN CCM Case Mgmt

## 2014-07-10 NOTE — Progress Notes (Addendum)
SUBJECTIVE:  Pt seen and examined in AM. No acute events overnight. Telemetry with sustained atrial fibrillation. Doopler US of LE did not reveal DVT. His left LE cellulitis is improving. His mentation is also improving and is faster in responding to questions today. He denies chest pain, dyspnea, or lightheadedness.       OBJECTIVE:   Vitals:   Filed Vitals:   07/09/14 2300 07/09/14 2346 07/10/14 0307 07/10/14 0751  BP: 99/50 91/59 113/43 132/72  Pulse: 92  86 84  Temp: 98.5 F (36.9 C)  98.5 F (36.9 C) 98 F (36.7 C)  TempSrc: Oral  Oral Oral  Resp: 22 15 25 22   Height:      Weight:   212 lb 11.9 oz (96.5 kg)   SpO2: 95% 93% 95% 95%   I&O's:   Intake/Output Summary (Last 24 hours) at 07/10/14 1053 Last data filed at 07/10/14 0900  Gross per 24 hour  Intake   1680 ml  Output   2275 ml  Net   -595 ml   TELEMETRY: Reviewed telemetry pt in atrial fibrillation:    PHYSICAL EXAM General: Well developed, well nourished, in no acute distress Head:   Normal cephalic and atramatic  Lungs:  Clear bilaterally to auscultation. Heart:  Irregularly irregular rhythm with normal heart rate   Abdomen: abdomen soft and non-tender Msk:  Back normal,  Normal strength and tone for age. Extremities: Left LE with improved erythema, swelling, and warmth.   Neuro: Alert and oriented. Psych:  Normal affect, delay in response  Skin: No rash   LABS: Basic Metabolic Panel:  Recent Labs  96/12/5409/04/15 0339 07/10/14 0250  NA 138 139  K 4.1 4.1  CL 106 109  CO2 21 18*  GLUCOSE 117* 93  BUN 24* 18  CREATININE 1.50* 1.35  CALCIUM 7.8* 8.1*   Liver Function Tests:  Recent Labs  07/08/14 1005 07/09/14 0339  AST 20 23  ALT 11 14  ALKPHOS 57 47  BILITOT 1.8* 1.8*  PROT 6.5 5.4*  ALBUMIN 3.5 2.8*   No results for input(s): LIPASE, AMYLASE in the last 72 hours. CBC:  Recent Labs  07/08/14 1005 07/09/14 0339 07/10/14 0250  WBC 15.7* 12.4* 9.2  NEUTROABS 14.9*  --   --     HGB 12.5* 10.5* 10.9*  HCT 38.0* 32.6* 32.6*  MCV 91.3 90.8 90.1  PLT 148* 132* 133*   Cardiac Enzymes:  Recent Labs  07/08/14 1005  TROPONINI <0.30   BNP: Invalid input(s): POCBNP D-Dimer: No results for input(s): DDIMER in the last 72 hours. Hemoglobin A1C: No results for input(s): HGBA1C in the last 72 hours. Fasting Lipid Panel: No results for input(s): CHOL, HDL, LDLCALC, TRIG, CHOLHDL, LDLDIRECT in the last 72 hours. Thyroid Function Tests:  Recent Labs  07/09/14 1255 07/10/14 0250  TSH 8.400*  --   T3FREE  --  2.8   Anemia Panel: No results for input(s): VITAMINB12, FOLATE, FERRITIN, TIBC, IRON, RETICCTPCT in the last 72 hours. Coag Panel:   No results found for: INR, PROTIME  RADIOLOGY: X-ray Chest Pa And Lateral  07/09/2014   CLINICAL DATA:  Sepsis, weakness, shortness of breath, subsequent evaluation  EXAM: CHEST  2 VIEW  COMPARISON:  07/08/2014  FINDINGS: The heart size and vascular pattern are normal. Two lead cardiac pacer with generator over left thorax stable. Vascular pattern is normal. Mild scattered subsegmental foci of atelectasis mid to lower lung zones. Trace pleural effusion. Stable mild hyperinflation. Interval development  of increased opacity in the retrocardiac portion of the left lower lobe.  IMPRESSION: Superimposed on mild bilateral atelectasis, there is interval development of retrocardiac opacity. This is favored to represent atelectasis as well although the possibility of developing pneumonitis is not excluded.  Interval development of trace bilateral pleural effusions.   Electronically Signed   By: Esperanza Heir M.D.   On: 07/09/2014 08:34   Dg Chest 2 View  07/08/2014   CLINICAL DATA:  Altered mental status and fever for 2 days  EXAM: CHEST  2 VIEW  COMPARISON:  PA and lateral chest of July 16, 2004  FINDINGS: The lungs are mildly hyperinflated but clear. The heart and pulmonary vascularity are normal. The permanent pacemaker is in  appropriate position radiographically. There is no pleural effusion or pneumothorax. The observed bony thorax is unremarkable.  IMPRESSION: Hyperinflation consistent with reactive airway disease or COPD. There is no evidence of pneumonia, CHF, or other acute cardiopulmonary abnormality.   Electronically Signed   By: David  [place]   On: 07/08/2014 09:44   Ct Head Wo Contrast  07/08/2014   CLINICAL DATA:  68 year old male with altered mental status. Fever for 2 days. Initial encounter.  EXAM: CT HEAD WITHOUT CONTRAST  TECHNIQUE: Contiguous axial images were obtained from the base of the skull through the vertex without intravenous contrast.  COMPARISON:  None.  FINDINGS: No intracranial hemorrhage.  No CT evidence of large acute infarct.  No hydrocephalus.  Cerebellar tonsils minimally low lying.  No intracranial mass lesion noted on this unenhanced exam.  Partial opacification inferior aspect left sphenoid sinus.  Partially empty sella.  Orbital structures unremarkable.  IMPRESSION: No intracranial hemorrhage.  No CT evidence of large acute infarct.  Cerebellar tonsils minimally low lying.  No intracranial mass lesion noted on this unenhanced exam.  Partial opacification inferior aspect left sphenoid sinus.   Electronically Signed   By: Bridgett Larsson M.D.   On: 07/08/2014 09:47   Dg Fluoro Guide Lumbar Puncture  07/08/2014   CLINICAL DATA:  Fever.  Sepsis.  Altered mental status.  EXAM: DIAGNOSTIC LUMBAR PUNCTURE UNDER FLUOROSCOPIC GUIDANCE  FLUOROSCOPY TIME:  0 min 43 seconds  PROCEDURE: Informed consent was obtained from the patient prior to the procedure, including potential complications of headache, allergy, and pain. With the patient prone, the lower back was prepped with Betadine. 1% Lidocaine was used for local anesthesia. Lumbar puncture was performed at the L3-4 level using a 20 gauge needle with return of clear CSF with an opening pressure of 16 cm water. 10ml of CSF were obtained for laboratory  studies. The patient tolerated the procedure well and there were no apparent complications.  IMPRESSION: Successful diagnostic lumbar puncture performed under fluoroscopic guidance. No evidence immediate complication.   Electronically Signed   By: Myles Rosenthal M.D.   On: 07/08/2014 18:06   ASSESSMENT: Pt is a 68 yo man with pmh of afib/flutter, sick sinus syndrome with pacemaker, LV dysfunction, hyperlipidemia, chronic anemia, amiodarone-induced hypothyroidism, CKD Stage III, BPH, and GERD who was admitted on 11/3 with sepsis of unknown etiology found to have atrial fibrillation not on [facility] therapy.   Plan:  Recurrent Atrial Fibrillation - Currently in AF with normal HR. Increase PO amiodarone 100 mg daily to 400 daily and continue metoprolol100 mg BID (home dose). Pt now on eliquis 5 mg BID due CHADS2 score of 2 (age and LV dysfunction). Risk and benefits of starting [facility] therapy were discussed with patient. He has low to moderate  risk of bleeding and has low fall risk (reports occasional internal hemorrhoidal bleeding and denies falls at home). He is reliable in taking medications and lives with his wife at home.  Left LE Cellulitis - Improving. Afebrile with resolved leukocytosis. Doppler LE was negative for DVT. Antibiotics per primary team, currently on vancomycin and aztreonam. Blood cultures negative to date. Consider transition to PO doxycycline for MRSA coverage. History of LV Dysfunction - 2D-echo with EF 55 to 60%.    Sick Sinus Syndrome s/p implanted dual chamber pacemaker - Pacemaker placed in 2009, no signs of infection of implanted pacemaker, doubt pacemaker infection. Blood cultures to date negative.  High TSH in setting of amiodarone use -T3 & T4 and normal. Increase PO amiodarone from 100 mg daily (was previously on 200 mg daily) to 400 mg daily. Will consider starting synthroid per pharmacy due to clinical symptoms (slow mental status).  Normocytic Anemia - Hg stable with no active  bleeding. Continue to monitor CBC in setting of [facility]. Pt with history of BRBPR due to internal hemorrhoids. Pt reports he has had colonoscopy in past 10 yrs revealing hemorrhoids. Consider obtaining anemia panel.  CKD Stage 3 - Renal function appears to be at baseline. Unclear if acute on chronic. Continue to monitor.   Otis BraceABBANI, MARJAN, MD  PGY-II IMTS Pager: [phone] 07/10/2014  10:53 AM  I have examined the patient and reviewed assessment and plan and discussed with patient.  Agree with above as stated.  Eliquis for stroke prevention.  Will increase Amio to see if rate control can be improved.  Continue Beta blocker as well.   Zaiya Annunziato S.  AFib persists. Plan TEE/CV in AM.  Procedure explained.  All questions answered.  Idrissa Beville S.

## 2014-07-11 ENCOUNTER — Inpatient Hospital Stay (HOSPITAL_COMMUNITY): Payer: Medicare HMO | Admitting: Certified Registered Nurse Anesthetist

## 2014-07-11 ENCOUNTER — Encounter (HOSPITAL_COMMUNITY): Payer: Self-pay

## 2014-07-11 ENCOUNTER — Encounter (HOSPITAL_COMMUNITY)
Admission: EM | Disposition: A | Discharge status: Home / Self Care | Payer: Self-pay | Source: Home / Self Care | Attending: Family Medicine

## 2014-07-11 DIAGNOSIS — A419 Sepsis, unspecified organism: Secondary | ICD-10-CM | POA: Diagnosis not present

## 2014-07-11 HISTORY — PX: CARDIOVERSION: SHX1299

## 2014-07-11 HISTORY — PX: TEE WITHOUT CARDIOVERSION: SHX5443

## 2014-07-11 LAB — CBC
HCT: 33.2 % — ABNORMAL LOW (ref 39.0–52.0)
HEMOGLOBIN: 10.9 g/dL — AB (ref 13.0–17.0)
MCH: 29.3 pg (ref 26.0–34.0)
MCHC: 32.8 g/dL (ref 30.0–36.0)
MCV: 89.2 fL (ref 78.0–100.0)
PLATELETS: 136 10*3/uL — AB (ref 150–400)
RBC: 3.72 MIL/uL — AB (ref 4.22–5.81)
RDW: 13.6 % (ref 11.5–15.5)
WBC: 7.6 10*3/uL (ref 4.0–10.5)

## 2014-07-11 LAB — VITAMIN B12: Vitamin B-12: 846 pg/mL (ref 211–911)

## 2014-07-11 LAB — BASIC METABOLIC PANEL
ANION GAP: 13 (ref 5–15)
BUN: 15 mg/dL (ref 6–23)
CHLORIDE: 105 meq/L (ref 96–112)
CO2: 20 meq/L (ref 19–32)
Calcium: 8.4 mg/dL (ref 8.4–10.5)
Creatinine, Ser: 1.17 mg/dL (ref 0.50–1.35)
GFR calc Af Amer: 71 mL/min — ABNORMAL LOW (ref >90)
GFR calc non Af Amer: 61 mL/min — ABNORMAL LOW (ref >90)
Glucose, Bld: 103 mg/dL — ABNORMAL HIGH (ref 70–99)
Potassium: 3.9 mEq/L (ref 3.7–5.3)
SODIUM: 138 meq/L (ref 137–147)

## 2014-07-11 LAB — IRON AND TIBC
Iron: 25 ug/dL — ABNORMAL LOW (ref 42–135)
Saturation Ratios: 10 % — ABNORMAL LOW (ref 20–55)
TIBC: 263 ug/dL (ref 215–435)
UIBC: 238 ug/dL (ref 125–400)

## 2014-07-11 LAB — FERRITIN: Ferritin: 115 ng/mL (ref 22–322)

## 2014-07-11 LAB — RETICULOCYTES
RBC.: 3.72 MIL/uL — AB (ref 4.22–5.81)
RETIC CT PCT: 1.3 % (ref 0.4–3.1)
Retic Count, Absolute: 48.4 10*3/uL (ref 19.0–186.0)

## 2014-07-11 LAB — FOLATE: FOLATE: 10 ng/mL

## 2014-07-11 SURGERY — ECHOCARDIOGRAM, TRANSESOPHAGEAL
Anesthesia: Monitor Anesthesia Care

## 2014-07-11 MED ORDER — MIDAZOLAM HCL 5 MG/5ML IJ SOLN
INTRAMUSCULAR | Status: DC | PRN
  Administered 2014-07-11: 0.5 mg via INTRAVENOUS

## 2014-07-11 MED ORDER — OFF THE BEAT BOOK
Freq: Once | Status: AC
  Administered 2014-07-11: 13:00:00
  Filled 2014-07-11: qty 1

## 2014-07-11 MED ORDER — LIDOCAINE VISCOUS 2 % MT SOLN
OROMUCOSAL | Status: AC
  Filled 2014-07-11: qty 15

## 2014-07-11 MED ORDER — SODIUM CHLORIDE 0.9 % IV SOLN
INTRAVENOUS | Status: DC
  Administered 2014-07-11: 08:00:00 via INTRAVENOUS

## 2014-07-11 MED ORDER — PROPOFOL INFUSION 10 MG/ML OPTIME
INTRAVENOUS | Status: DC | PRN
  Administered 2014-07-11: 100 ug/kg/min via INTRAVENOUS

## 2014-07-11 MED ORDER — FENTANYL CITRATE 0.05 MG/ML IJ SOLN
INTRAMUSCULAR | Status: DC | PRN
  Administered 2014-07-11: 25 ug via INTRAVENOUS

## 2014-07-11 MED ORDER — FERROUS SULFATE 325 (65 FE) MG PO TABS
325.0000 mg | ORAL_TABLET | Freq: Every day | ORAL | Status: DC
  Administered 2014-07-12 – 2014-07-15 (×4): 325 mg via ORAL
  Filled 2014-07-11 (×5): qty 1

## 2014-07-11 MED ORDER — AMIODARONE HCL IN DEXTROSE 360-4.14 MG/200ML-% IV SOLN
60.0000 mg/h | INTRAVENOUS | Status: AC
  Administered 2014-07-11: 60 mg/h via INTRAVENOUS
  Filled 2014-07-11 (×2): qty 200

## 2014-07-11 MED ORDER — LIDOCAINE VISCOUS 2 % MT SOLN
OROMUCOSAL | Status: DC | PRN
  Administered 2014-07-11: 10 mL via OROMUCOSAL

## 2014-07-11 MED ORDER — LACTATED RINGERS IV SOLN
INTRAVENOUS | Status: DC | PRN
  Administered 2014-07-11: 11:00:00 via INTRAVENOUS

## 2014-07-11 NOTE — Interval H&P Note (Signed)
History and Physical Interval Note:  07/11/2014 11:04 AM  Edward SettersAlbert C Frate  has presented today for surgery, with the diagnosis of afib  The various methods of treatment have been discussed with the patient and family. After consideration of risks, benefits and other options for treatment, the patient has consented to  Procedure(s): TRANSESOPHAGEAL ECHOCARDIOGRAM (TEE) (N/A) CARDIOVERSION (N/A) as a surgical intervention .  The patient's history has been reviewed, patient examined, no change in status, stable for surgery.  I have reviewed the patient's chart and labs.  Questions were answered to the patient's satisfaction.     Dietrich PatesPaula Achillies Buehl

## 2014-07-11 NOTE — Progress Notes (Signed)
PT Cancellation Note  Patient Details Name: Fransico Setterslbert C Kinnick MRN: [id number] DOB: [birth year]   Cancelled Treatment:    Reason Eval/Treat Not Completed: Medical issues which prohibited therapy (Pt back in A-fib with HR 120's at rest.)   [facility] Specialty HospitalMAYCOCK,Kirandeep Fariss 07/11/2014, 4:11 PM

## 2014-07-11 NOTE — Plan of Care (Signed)
Problem: Phase I Progression Outcomes Goal: Heart rate or rhythm control medication Outcome: Progressing     

## 2014-07-11 NOTE — Progress Notes (Signed)
Family Medicine Teaching Service Daily Progress Note Intern Pager: [phone]  Patient name: Edward [name] Medical record number: [id number] Date of birth: 1946/07/28 Age: 68 y.o. Gender: male  Primary Care Provider: No primary care provider on file.Dr. Ouida Sills at [facility] Consultants: none Code Status: FULL  Pt Overview and Major Events to Date:  07/08/14: patient admitted with sirs without clear source; got LP by IR that had neg gram stain; blood, urine, and CSF cx pending; started vanc/zosyn then zosyn switched to aztreonam due development of rash 07/09/14: restarted home a fib meds and started on eliquis for anticoag; LE dopplers done to rule out DVT and negative; TTE done showing normal EF 07/10/14: amiodarone increased; abx narrowed to po clinda to cover cellulitis and possible mrsa; he remained afebrile  Assessment and Plan: Edward [name] is a 68 y.o. male presenting with sepsis and AMS . PMH is significant for sinoatrial node dysfunction with pacemaker, a fib, LV dysfunction, GERD, HTN, hypothyroidism.  # Sepsis: Improved. Now on po clinda. Has been afebrile last 24 hours, wbc count normal, symptomatic improvement as well. On admission met SIRS criteria with fever to 40.4, tachycardic to 132, leukocytosis to 15, SBP of 83, hyperlactatemia of 3.53, and AMS. CXR clear on 11/3, no pneumonia development on repeat on 11/4. Urine clean. CSF gram stain negative.  - on vanc/aztreonam (had allergic reaction--rash--to zosyn) - blood cxs no growth to date - CSF cx no growth to date - urine cx no growth to date - TTE: EF of 55-60% - TEE today will likely reveal if vegetations present - discontinue orthopantogram given we have a likely source of cellulitis now - marked improvement of cellulitis today - monitor on tele  - incentive spirometry for atelectasis  # LE cellulitis: Marked improvement, responding well to po clinda. LE dopplers negative. - continue to monitor resolution of  erythema, edema, and warmth  # A fib/flutter: Persistently in a fib with rvr of 110-120s. On amio and metop, amio increased yesterday given persistent a fib. - TEE guided cardioversion today - Eliquis for anticoagulation for stroke prevention given A fib, case management confirmed that patient's insurance will make cost $6.60 for him - monitor on tele - f/u further cardiology recs  #Sick sinus syndrome: has pacemaker. Need to consider potential for this as site of infection (primary or secondary) however unlikely given negative blood cultures. - TEE will likely show if any vegetations present - patient has outpatient f/u appt 11/12 to have pacemaker interrogated by [employer] (last interrogated 01/2014)  #Hypothyroidism: on synthroid in past. Unsure if was on it prior to admission. - TSH 8.400 with normal free T3 and T4, likely sick euthyroid. Also consider toxicity from amiodarone. - recheck TSH in 2-3 months outpatient  FEN/GI: npo for TEE PPx: Eliquis as above  Disposition: home today if cardioverts and HR stable continuously for several hours, otherwise likely tomorrow    Subjective:  Patient states he feels better today, he is just "concerned about his numbers (HR) still being high and would like it to be lower before going home". No left ankle pain at all. No CP, SOB.  Objective: Temp:  [97.5 F (36.4 C)-99.8 F (37.7 C)] 97.5 F (36.4 C) (11/06 0741) Pulse Rate:  [78-100] 92 (11/06 0741) Resp:  [16-34] 18 (11/06 0741) BP: (108-133)/(54-78) 133/69 mmHg (11/06 0741) SpO2:  [94 %-96 %] 95 % (11/06 0741) Weight:  [210 lb 15.7 oz (95.7 kg)] 210 lb 15.7 oz (95.7 kg) (11/06 0331) Physical  Exam: General: NAD, sitting up in bed watching tv Cardiovascular: irregularly irregular rhythm, tachycardic, no murmurs, rubs, or gallops Respiratory: normal work of breathing, CTAB except for very faint crackles in bilateral bases Abdomen: NABS, non-distended, non-tender to palpation; defect  in abdominal wall muscles with small hernia palpated in RLQ, reducible, nontender, no HSM Extremities: Left lower extremity with slight warmth, mild edema and slight erythema of ankle. 1-inch bulla on posterior left ankle. Ankle non-tender to palpation. Right lower extremity non-erythematous, no edema. DP pulses 2+ bilaterally.  Laboratory:  Recent Labs Lab 07/09/14 0339 07/10/14 0250 07/11/14 0240  WBC 12.4* 9.2 7.6  HGB 10.5* 10.9* 10.9*  HCT 32.6* 32.6* 33.2*  PLT 132* 133* 136*    Recent Labs Lab 07/08/14 1005 07/09/14 0339 07/10/14 0250 07/11/14 0240  NA 140 138 139 138  K 4.2 4.1 4.1 3.9  CL 103 106 109 105  CO2 22 21 18* 20  BUN 18 24* 18 15  CREATININE 1.52* 1.50* 1.35 1.17  CALCIUM 8.5 7.8* 8.1* 8.4  PROT 6.5 5.4*  --   --   BILITOT 1.8* 1.8*  --   --   ALKPHOS 57 47  --   --   ALT 11 14  --   --   AST 20 23  --   --   GLUCOSE 105* 117* 93 103*  TSH: 8.400 T3: 2.8 T4: 1.08  Urine cx: no growth to date  Blood cx: no growth to date x2  CSF:  Glucose: 63 Total protein: 55 RBC count: 1 WBC count: 2 Segmented neutro: none Appearance: clear Color: colorless  CSF gram stain: WBC PRESENT, PREDOMINANTLY MONONUCLEAR  NO ORGANISMS SEEN   CSF culture: no growth to date   Imaging/Diagnostic Tests: CXR (07/08/14): IMPRESSION: Hyperinflation consistent with reactive airway disease or COPD. There is no evidence of pneumonia, CHF, or other acute cardiopulmonary abnormality.  CXR (07/09/14): IMPRESSION: Superimposed on mild bilateral atelectasis, there is interval development of retrocardiac opacity. This is favored to represent atelectasis as well although the possibility of developing pneumonitis is not excluded.  Interval development of trace bilateral pleural effusions.  2D Echo:  Study Conclusions - Left ventricle: The cavity size was normal. Wall thickness was normal. Systolic function was normal. The estimated ejection fraction was in the  range of 55% to 60%. Wall motion was normal; there were no regional wall motion abnormalities. - Pulmonary arteries: Systolic pressure was mildly increased. PA peak pressure: 31 mm Hg (S).  Montel Clock, Med Student 07/11/2014, 8:06 AM [place] Intern pager: [phone], text pages welcome    As seen and examined [name] with student Dr. Gladys Damme and I agree with her documentation above. I have reviewed her note and detail and my summary is below.  Subjective: He states that he is feeling well compared to the day of admission and has no complaints.  Active: Filed Vitals:   07/11/14 1212  BP: 91/37  Pulse: 54  Temp: 98.5 F (36.9 C)  Resp: 26   Gen: NAD, alert, cooperative with exam, sits up with only moderate effort HEENT: NCAT, EOMI, PERRL CV: Irregularly irregular, tachycardia at the time of exam, no murmur Resp: CTABL, no wheezes, non-labored Abd: SNTND, BS present, no guarding or organomegaly Ext: left lower extremity with erythema and swelling improved from previous exam, erythema has retracted from the line marked in his leg, approximately 2 cm circular bulla on his posterior left lower extremity Neuro: Alert and oriented, No gross  deficits  Assessment and plan: Mr.Nobrega is a 68 year old male with sepsis due to cellulitis and was irritable development of A. Fib with RVR.from infectious standpoint he is stable on by mouth clindamycin, however he continues to have A. Fib with RVR this morning. He is currently status post transesophageal echocardiogram and cardioversion. He appears to be in sinus bradycardia currently.  We will monitor him for at least one more night given his bradycardia currently and relatively soft blood pressures.  Laroy Apple, MD [place] Resident, PGY-3 07/11/2014, 12:26 PM

## 2014-07-11 NOTE — Op Note (Signed)
  Cardoversion  Patient anesthetized by anesthesia with Propofol  With pads in AP position, patient cardioverted with 150 J synchronized biphasic energy. To SR Procedure without comlications. Pacer interrogated.

## 2014-07-11 NOTE — Progress Notes (Signed)
SUBJECTIVE:  Pt seen and examined in AM. No acute events overnight. Telemetry with sustained atrial fibrillation. He agreed yesterday to undergo TEE with cardioversion today. He denies chest pain, dyspnea, lightheadedness, or bleeding. His left LE cellulitis continues to improve in addition to his mental status.    OBJECTIVE:   Vitals:   Filed Vitals:   07/11/14 0331 07/11/14 0741 07/11/14 0830 07/11/14 1017  BP: 127/54 133/69 128/62 115/78  Pulse: 86 92 90 92  Temp: 99.8 F (37.7 C) 97.5 F (36.4 C) 98.1 F (36.7 C) 98.4 F (36.9 C)  TempSrc: Oral Oral Oral Oral  Resp: 26 18 22 14   Height:      Weight: 210 lb 15.7 oz (95.7 kg)     SpO2: 94% 95% 95% 97%   I&O's:    Intake/Output Summary (Last 24 hours) at 07/11/14 1028 Last data filed at 07/11/14 0651  Gross per 24 hour  Intake   1030 ml  Output   1450 ml  Net   -420 ml   TELEMETRY: Reviewed telemetry pt in atrial fibrillation:    PHYSICAL EXAM General: Well developed, well nourished, in no acute distress Head:   Normal cephalic and atramatic  Lungs:  Clear bilaterally to auscultation. Heart:  Irregularly irregular rhythm with normal heart rate   Abdomen: Abdomen soft and non-tender Msk:  Back normal,  Normal strength and tone for age. Extremities: Left LE with improved erythema, swelling, and warmth.   Neuro: Alert and oriented. Psych:  Normal affect, improved response  Skin: No rash   LABS: Basic Metabolic Panel:  Recent Labs  62/13/810/01/17 0250 07/11/14 0240  NA 139 138  K 4.1 3.9  CL 109 105  CO2 18* 20  GLUCOSE 93 103*  BUN 18 15  CREATININE 1.35 1.17  CALCIUM 8.1* 8.4   Liver Function Tests:  Recent Labs  07/09/14 0339  AST 23  ALT 14  ALKPHOS 47  BILITOT 1.8*  PROT 5.4*  ALBUMIN 2.8*   No results for input(s): LIPASE, AMYLASE in the last 72 hours. CBC:  Recent Labs  07/10/14 0250 07/11/14 0240  WBC 9.2 7.6  HGB 10.9* 10.9*  HCT 32.6* 33.2*  MCV 90.1 89.2  PLT 133* 136*    Cardiac Enzymes: No results for input(s): CKTOTAL, CKMB, CKMBINDEX, TROPONINI in the last 72 hours. BNP: Invalid input(s): POCBNP D-Dimer: No results for input(s): DDIMER in the last 72 hours. Hemoglobin A1C: No results for input(s): HGBA1C in the last 72 hours. Fasting Lipid Panel: No results for input(s): CHOL, HDL, LDLCALC, TRIG, CHOLHDL, LDLDIRECT in the last 72 hours. Thyroid Function Tests:  Recent Labs  07/09/14 1255 07/10/14 0250  TSH 8.400*  --   T3FREE  --  2.8   Anemia Panel:  Recent Labs  07/11/14 0240  RETICCTPCT 1.3   Coag Panel:   No results found for: INR, PROTIME  RADIOLOGY: X-ray Chest Pa And Lateral  07/09/2014   CLINICAL DATA:  Sepsis, weakness, shortness of breath, subsequent evaluation  EXAM: CHEST  2 VIEW  COMPARISON:  07/08/2014  FINDINGS: The heart size and vascular pattern are normal. Two lead cardiac pacer with generator over left thorax stable. Vascular pattern is normal. Mild scattered subsegmental foci of atelectasis mid to lower lung zones. Trace pleural effusion. Stable mild hyperinflation. Interval development of increased opacity in the retrocardiac portion of the left lower lobe.  IMPRESSION: Superimposed on mild bilateral atelectasis, there is interval development of retrocardiac opacity. This is favored to represent  atelectasis as well although the possibility of developing pneumonitis is not excluded.  Interval development of trace bilateral pleural effusions.   Electronically Signed   By: Esperanza Heir M.D.   On: 07/09/2014 08:34   Dg Chest 2 View  07/08/2014   CLINICAL DATA:  Altered mental status and fever for 2 days  EXAM: CHEST  2 VIEW  COMPARISON:  PA and lateral chest of July 16, 2004  FINDINGS: The lungs are mildly hyperinflated but clear. The heart and pulmonary vascularity are normal. The permanent pacemaker is in appropriate position radiographically. There is no pleural effusion or pneumothorax. The observed bony thorax is  unremarkable.  IMPRESSION: Hyperinflation consistent with reactive airway disease or COPD. There is no evidence of pneumonia, CHF, or other acute cardiopulmonary abnormality.   Electronically Signed   By: David  [place]   On: 07/08/2014 09:44   Ct Head Wo Contrast  07/08/2014   CLINICAL DATA:  68 year old male with altered mental status. Fever for 2 days. Initial encounter.  EXAM: CT HEAD WITHOUT CONTRAST  TECHNIQUE: Contiguous axial images were obtained from the base of the skull through the vertex without intravenous contrast.  COMPARISON:  None.  FINDINGS: No intracranial hemorrhage.  No CT evidence of large acute infarct.  No hydrocephalus.  Cerebellar tonsils minimally low lying.  No intracranial mass lesion noted on this unenhanced exam.  Partial opacification inferior aspect left sphenoid sinus.  Partially empty sella.  Orbital structures unremarkable.  IMPRESSION: No intracranial hemorrhage.  No CT evidence of large acute infarct.  Cerebellar tonsils minimally low lying.  No intracranial mass lesion noted on this unenhanced exam.  Partial opacification inferior aspect left sphenoid sinus.   Electronically Signed   By: Bridgett Larsson M.D.   On: 07/08/2014 09:47   Dg Fluoro Guide Lumbar Puncture  07/08/2014   CLINICAL DATA:  Fever.  Sepsis.  Altered mental status.  EXAM: DIAGNOSTIC LUMBAR PUNCTURE UNDER FLUOROSCOPIC GUIDANCE  FLUOROSCOPY TIME:  0 min 43 seconds  PROCEDURE: Informed consent was obtained from the patient prior to the procedure, including potential complications of headache, allergy, and pain. With the patient prone, the lower back was prepped with Betadine. 1% Lidocaine was used for local anesthesia. Lumbar puncture was performed at the L3-4 level using a 20 gauge needle with return of clear CSF with an opening pressure of 16 cm water. 10ml of CSF were obtained for laboratory studies. The patient tolerated the procedure well and there were no apparent complications.  IMPRESSION: Successful  diagnostic lumbar puncture performed under fluoroscopic guidance. No evidence immediate complication.   Electronically Signed   By: Myles Rosenthal M.D.   On: 07/08/2014 18:06   ASSESSMENT: Pt is a 68 yo man with pmh of afib/flutter, sick sinus syndrome with pacemaker, LV dysfunction, hyperlipidemia, chronic anemia, amiodarone-induced hypothyroidism, CKD Stage III, BPH, and GERD who was admitted on 11/3 with sepsis of unknown etiology found to have atrial fibrillation not on [facility] therapy.   Plan:  Sustained Atrial Fibrillation with RVR-  Currently asymptotic in AF with RVR. Pt scheduled for TEE with cardioversion today due to failure to spontaneously cardiovert with medical therapy. Currently on PO amiodarone 400 daily and metoprolol100 mg BID (home dose). Pt on eliquis 5 mg BID for [facility] therapy due to CHADS2 score of 2 (age and LV dysfunction) since 11/4.   Left LE Cellulitis - Improving. Afebrile with resolved leukocytosis. Doppler LE was negative for DVT. Antibiotics per primary team, currently on PO clindamycin. Blood cultures negative  to date. History of LV Dysfunction - 2D-echo with EF 55 to 60%.    Sick Sinus Syndrome s/p implanted dual chamber pacemaker - Pacemaker placed in 2009, no signs of infection of implanted pacemaker, doubt pacemaker infection. Blood cultures to date negative. Amiodarone-induced Subclinical/Clinical Hypothyroidism  -T3 & T4 and normal. Pt currently on PO amiodarone 400 mg daily (was previously on 200 mg daily). Pt to have outpatient follow-up to recheck TSH level after resolution of acute illness and consider starting synthroid due to clinical symptoms (mental status slowing).  Normocytic Anemia - Hg stable at 11 with no active bleeding. Continue to monitor CBC in setting of [facility] therapy. Pt with history of BRBPR due to internal hemorrhoids. Pt reports he has had colonoscopy in past 10 yrs revealing hemorrhoids. F/u anemia panel.  AKI on CKD Stage 3 - Cr improved to  1.17 from 1.52 on admission. Unknown baseline Cr. Continue to monitor.   Edward [name], MARJAN, MD  PGY-II IMTS Pager: [phone] 07/11/2014  10:28 AM   I have examined the patient and reviewed assessment and plan and discussed with patient.  Agree with above as stated.  Plan for TEE/CV later today./  All questions answered.  Reduce amiodarone post cardioversion.  Possible discharge soon.  Needs eliquis as outpatient.  Adalay Azucena S.

## 2014-07-11 NOTE — Progress Notes (Signed)
PT Cancellation Note  Patient Details Name: Edward [name] MRN: [id number] DOB: [birth year]   Cancelled Treatment:    Reason Eval/Treat Not Completed: Patient at procedure or test/unavailable. Pt at TEE with cardioversion. Will try again later today.   Edward [name] 07/11/2014, 11:04 AM  Edward [name] PT [phone]

## 2014-07-11 NOTE — Op Note (Signed)
TEE  LA, LAA without thrombus   Full report to follow in CV section.

## 2014-07-11 NOTE — Anesthesia Preprocedure Evaluation (Addendum)
Anesthesia Evaluation  Patient identified by MRN, date of birth, ID band Patient awake    Reviewed: Allergy & Precautions, H&P , NPO status , Patient's Chart, lab work & pertinent test results  History of Anesthesia Complications Negative for: history of anesthetic complications  Airway Mallampati: II  TM Distance: >3 FB Neck ROM: Full    Dental  (+) Missing, Loose, Poor Dentition, Dental Advisory Given, Chipped   Pulmonary former smoker (quit 1967),  breath sounds clear to auscultation        Cardiovascular hypertension, Pt. on medications and Pt. on home beta blockers - angina+ dysrhythmias Atrial Fibrillation + pacemaker Rhythm:Irregular Rate:Abnormal  11/15 ECHO: EF 55-60%, valves OK   Neuro/Psych negative neurological ROS     GI/Hepatic Neg liver ROS, GERD-  Controlled,  Endo/Other  Hypothyroidism Morbid obesity  Renal/GU negative Renal ROS     Musculoskeletal   Abdominal (+) + obese,  Abdomen: soft.    Peds  Hematology  (+) Blood dyscrasia (Hb 10.9), ,   Anesthesia Other Findings   Reproductive/Obstetrics                          Anesthesia Physical Anesthesia Plan  ASA: III  Anesthesia Plan: MAC   Post-op Pain Management:    Induction: Intravenous  Airway Management Planned: Mask  Additional Equipment:   Intra-op Plan:   Post-operative Plan:   Informed Consent: I have reviewed the patients History and Physical, chart, labs and discussed the procedure including the risks, benefits and alternatives for the proposed anesthesia with the patient or authorized representative who has indicated his/her understanding and acceptance.   Dental advisory given  Plan Discussed with: Anesthesiologist, Surgeon and CRNA  Anesthesia Plan Comments: (Plan routine monitors, MAC)       Anesthesia Quick Evaluation

## 2014-07-11 NOTE — Transfer of Care (Signed)
Immediate Anesthesia Transfer of Care Note  Patient: Edward [name]  Procedure(s) Performed: Procedure(s): TRANSESOPHAGEAL ECHOCARDIOGRAM (TEE) (N/A) CARDIOVERSION (N/A)  Patient Location: Endoscopy Unit  Anesthesia Type:MAC  Level of Consciousness: awake, alert  and oriented  Airway & Oxygen Therapy: Patient Spontanous Breathing  Post-op Assessment: Report given to PACU RN  Post vital signs: Reviewed and stable  Complications: No apparent anesthesia complications

## 2014-07-11 NOTE — Anesthesia Postprocedure Evaluation (Signed)
  Anesthesia Post-op Note  Patient: Edward [name]  Procedure(s) Performed: Procedure(s): TRANSESOPHAGEAL ECHOCARDIOGRAM (TEE) (N/A) CARDIOVERSION (N/A)  Patient Location: Endoscopy Unit  Anesthesia Type:MAC  Level of Consciousness: awake, alert  and oriented  Airway and Oxygen Therapy: Patient Spontanous Breathing  Post-op Pain: none  Post-op Assessment: Post-op Vital signs reviewed, Patient's Cardiovascular Status Stable, Respiratory Function Stable, Patent Airway and No signs of Nausea or vomiting  Post-op Vital Signs: Reviewed and stable  Last Vitals:  Filed Vitals:   07/11/14 1017  BP: 115/78  Pulse: 92  Temp: 36.9 C  Resp: 14    Complications: No apparent anesthesia complications

## 2014-07-11 NOTE — Progress Notes (Signed)
Notified that patient is back in atrial fib with rates in 100s at present.  BP 90s/ Would try IV amiodarone with no bolus to increase stores.   Question witll be if this is worth doing.  Ptient will probably feel better in SR.

## 2014-07-11 NOTE — H&P (View-Only) (Signed)
SUBJECTIVE:  Pt seen and examined in AM. No acute events overnight. Telemetry with sustained atrial fibrillation. Doopler US of LE did not reveal DVT. His left LE cellulitis is improving. His mentation is also improving and is faster in responding to questions today. He denies chest pain, dyspnea, or lightheadedness.       OBJECTIVE:   Vitals:   Filed Vitals:   07/09/14 2300 07/09/14 2346 07/10/14 0307 07/10/14 0751  BP: 99/50 91/59 113/43 132/72  Pulse: 92  86 84  Temp: 98.5 F (36.9 C)  98.5 F (36.9 C) 98 F (36.7 C)  TempSrc: Oral  Oral Oral  Resp: 22 15 25 22   Height:      Weight:   212 lb 11.9 oz (96.5 kg)   SpO2: 95% 93% 95% 95%   I&O's:   Intake/Output Summary (Last 24 hours) at 07/10/14 1053 Last data filed at 07/10/14 0900  Gross per 24 hour  Intake   1680 ml  Output   2275 ml  Net   -595 ml   TELEMETRY: Reviewed telemetry pt in atrial fibrillation:    PHYSICAL EXAM General: Well developed, well nourished, in no acute distress Head:   Normal cephalic and atramatic  Lungs:  Clear bilaterally to auscultation. Heart:  Irregularly irregular rhythm with normal heart rate   Abdomen: abdomen soft and non-tender Msk:  Back normal,  Normal strength and tone for age. Extremities: Left LE with improved erythema, swelling, and warmth.   Neuro: Alert and oriented. Psych:  Normal affect, delay in response  Skin: No rash   LABS: Basic Metabolic Panel:  Recent Labs  96/12/5409/04/15 0339 07/10/14 0250  NA 138 139  K 4.1 4.1  CL 106 109  CO2 21 18*  GLUCOSE 117* 93  BUN 24* 18  CREATININE 1.50* 1.35  CALCIUM 7.8* 8.1*   Liver Function Tests:  Recent Labs  07/08/14 1005 07/09/14 0339  AST 20 23  ALT 11 14  ALKPHOS 57 47  BILITOT 1.8* 1.8*  PROT 6.5 5.4*  ALBUMIN 3.5 2.8*   No results for input(s): LIPASE, AMYLASE in the last 72 hours. CBC:  Recent Labs  07/08/14 1005 07/09/14 0339 07/10/14 0250  WBC 15.7* 12.4* 9.2  NEUTROABS 14.9*  --   --     HGB 12.5* 10.5* 10.9*  HCT 38.0* 32.6* 32.6*  MCV 91.3 90.8 90.1  PLT 148* 132* 133*   Cardiac Enzymes:  Recent Labs  07/08/14 1005  TROPONINI <0.30   BNP: Invalid input(s): POCBNP D-Dimer: No results for input(s): DDIMER in the last 72 hours. Hemoglobin A1C: No results for input(s): HGBA1C in the last 72 hours. Fasting Lipid Panel: No results for input(s): CHOL, HDL, LDLCALC, TRIG, CHOLHDL, LDLDIRECT in the last 72 hours. Thyroid Function Tests:  Recent Labs  07/09/14 1255 07/10/14 0250  TSH 8.400*  --   T3FREE  --  2.8   Anemia Panel: No results for input(s): VITAMINB12, FOLATE, FERRITIN, TIBC, IRON, RETICCTPCT in the last 72 hours. Coag Panel:   No results found for: INR, PROTIME  RADIOLOGY: X-ray Chest Pa And Lateral  07/09/2014   CLINICAL DATA:  Sepsis, weakness, shortness of breath, subsequent evaluation  EXAM: CHEST  2 VIEW  COMPARISON:  07/08/2014  FINDINGS: The heart size and vascular pattern are normal. Two lead cardiac pacer with generator over left thorax stable. Vascular pattern is normal. Mild scattered subsegmental foci of atelectasis mid to lower lung zones. Trace pleural effusion. Stable mild hyperinflation. Interval development  of increased opacity in the retrocardiac portion of the left lower lobe.  IMPRESSION: Superimposed on mild bilateral atelectasis, there is interval development of retrocardiac opacity. This is favored to represent atelectasis as well although the possibility of developing pneumonitis is not excluded.  Interval development of trace bilateral pleural effusions.   Electronically Signed   By: Esperanza Heir M.D.   On: 07/09/2014 08:34   Dg Chest 2 View  07/08/2014   CLINICAL DATA:  Altered mental status and fever for 2 days  EXAM: CHEST  2 VIEW  COMPARISON:  PA and lateral chest of July 16, 2004  FINDINGS: The lungs are mildly hyperinflated but clear. The heart and pulmonary vascularity are normal. The permanent pacemaker is in  appropriate position radiographically. There is no pleural effusion or pneumothorax. The observed bony thorax is unremarkable.  IMPRESSION: Hyperinflation consistent with reactive airway disease or COPD. There is no evidence of pneumonia, CHF, or other acute cardiopulmonary abnormality.   Electronically Signed   By: David  [place]   On: 07/08/2014 09:44   Ct Head Wo Contrast  07/08/2014   CLINICAL DATA:  68 year old male with altered mental status. Fever for 2 days. Initial encounter.  EXAM: CT HEAD WITHOUT CONTRAST  TECHNIQUE: Contiguous axial images were obtained from the base of the skull through the vertex without intravenous contrast.  COMPARISON:  None.  FINDINGS: No intracranial hemorrhage.  No CT evidence of large acute infarct.  No hydrocephalus.  Cerebellar tonsils minimally low lying.  No intracranial mass lesion noted on this unenhanced exam.  Partial opacification inferior aspect left sphenoid sinus.  Partially empty sella.  Orbital structures unremarkable.  IMPRESSION: No intracranial hemorrhage.  No CT evidence of large acute infarct.  Cerebellar tonsils minimally low lying.  No intracranial mass lesion noted on this unenhanced exam.  Partial opacification inferior aspect left sphenoid sinus.   Electronically Signed   By: Bridgett Larsson M.D.   On: 07/08/2014 09:47   Dg Fluoro Guide Lumbar Puncture  07/08/2014   CLINICAL DATA:  Fever.  Sepsis.  Altered mental status.  EXAM: DIAGNOSTIC LUMBAR PUNCTURE UNDER FLUOROSCOPIC GUIDANCE  FLUOROSCOPY TIME:  0 min 43 seconds  PROCEDURE: Informed consent was obtained from the patient prior to the procedure, including potential complications of headache, allergy, and pain. With the patient prone, the lower back was prepped with Betadine. 1% Lidocaine was used for local anesthesia. Lumbar puncture was performed at the L3-4 level using a 20 gauge needle with return of clear CSF with an opening pressure of 16 cm water. 10ml of CSF were obtained for laboratory  studies. The patient tolerated the procedure well and there were no apparent complications.  IMPRESSION: Successful diagnostic lumbar puncture performed under fluoroscopic guidance. No evidence immediate complication.   Electronically Signed   By: Myles Rosenthal M.D.   On: 07/08/2014 18:06   ASSESSMENT: Pt is a 68 yo man with pmh of afib/flutter, sick sinus syndrome with pacemaker, LV dysfunction, hyperlipidemia, chronic anemia, amiodarone-induced hypothyroidism, CKD Stage III, BPH, and GERD who was admitted on 11/3 with sepsis of unknown etiology found to have atrial fibrillation not on [facility] therapy.   Plan:  Recurrent Atrial Fibrillation - Currently in AF with normal HR. Increase PO amiodarone 100 mg daily to 400 daily and continue metoprolol100 mg BID (home dose). Pt now on eliquis 5 mg BID due CHADS2 score of 2 (age and LV dysfunction). Risk and benefits of starting [facility] therapy were discussed with patient. He has low to moderate  risk of bleeding and has low fall risk (reports occasional internal hemorrhoidal bleeding and denies falls at home). He is reliable in taking medications and lives with his wife at home.  Left LE Cellulitis - Improving. Afebrile with resolved leukocytosis. Doppler LE was negative for DVT. Antibiotics per primary team, currently on vancomycin and aztreonam. Blood cultures negative to date. Consider transition to PO doxycycline for MRSA coverage. History of LV Dysfunction - 2D-echo with EF 55 to 60%.    Sick Sinus Syndrome s/p implanted dual chamber pacemaker - Pacemaker placed in 2009, no signs of infection of implanted pacemaker, doubt pacemaker infection. Blood cultures to date negative.  High TSH in setting of amiodarone use -T3 & T4 and normal. Increase PO amiodarone from 100 mg daily (was previously on 200 mg daily) to 400 mg daily. Will consider starting synthroid per pharmacy due to clinical symptoms (slow mental status).  Normocytic Anemia - Hg stable with no active  bleeding. Continue to monitor CBC in setting [facility]. Pt with history of BRBPR due to internal hemorrhoids. Pt reports he has had colonoscopy in past 10 yrs revealing hemorrhoids. Consider obtaining anemia panel.  CKD Stage 3 - Renal function appears to be at baseline. Unclear if acute on chronic. Continue to monitor.   Edward [name], MARJAN, MD  PGY-II IMTS Pager: [phone] 07/10/2014  10:53 AM  I have examined the patient and reviewed assessment and plan and discussed with patient.  Agree with above as stated.  Eliquis for stroke prevention.  Will increase Amio to see if rate control can be improved.  Continue Beta blocker as well.   Andromeda Poppen S.  AFib persists. Plan TEE/CV in AM.  Procedure explained.  All questions answered.  Shamyia Grandpre S.

## 2014-07-12 MED ORDER — AMIODARONE HCL IN DEXTROSE 360-4.14 MG/200ML-% IV SOLN
30.0000 mg/h | INTRAVENOUS | Status: DC
  Administered 2014-07-12: 30 mg/h via INTRAVENOUS
  Filled 2014-07-12 (×2): qty 200

## 2014-07-12 MED ORDER — CETYLPYRIDINIUM CHLORIDE 0.05 % MT LIQD
7.0000 mL | Freq: Two times a day (BID) | OROMUCOSAL | Status: DC
  Administered 2014-07-12 – 2014-07-15 (×6): 7 mL via OROMUCOSAL

## 2014-07-12 NOTE — Progress Notes (Signed)
PT Cancellation Note  Patient Details Name: Edward [name] MRN: [id number] DOB: 1945-12-09   Cancelled Treatment:    Reason Eval/Treat Not Completed: Medical issues which prohibited therapy . Pt remains tachy and in afib; will re-attempt evaluation when medically ready.   Donnamarie PoagWest, Cace Osorto [place], [place]PT  [phone] 07/12/2014, 1:48 PM

## 2014-07-12 NOTE — Progress Notes (Signed)
Family Medicine Teaching Service Daily Progress Note Intern Pager: [phone]  Patient name: Edward [name] Medical record number: [id number] Date of birth: 05-Jan-1946 Age: 68 y.o. Gender: male  Primary Care Provider: No primary care provider on file.[facility] Consultants: none Code Status: FULL  Pt Overview and Major Events to Date:  07/08/14: patient admitted with sirs without clear source; got LP by IR that had neg gram stain; blood, urine, and CSF cx pending; started vanc/zosyn then zosyn switched to aztreonam due development of rash 07/09/14: restarted home a fib meds and started on eliquis for anticoag; LE dopplers done to rule out DVT and negative; TTE done showing normal EF 07/10/14: amiodarone increased; abx narrowed to po clinda to cover cellulitis and possible mrsa; he remained afebrile 11/6 TEE, cardioversion, NSR which returned to Afib later in the day  Assessment and Plan: Edward Setterslbert C Thum is a 68 y.o. male presenting with sepsis and AMS . PMH is significant for sinoatrial node dysfunction with pacemaker, a fib, LV dysfunction, GERD, HTN, hypothyroidism.  # Sepsis: Resolved, source cellulitis - initially vanc/aztreonam and transitioned to PO clinda - blood cxs no growth to date - CSF cx no growth to date - urine cx no growth to date - TTE: EF of 55-60% - monitor on tele  - incentive spirometry for atelectasis  # LE cellulitis: Improving. - continue to monitor resolution of erythema, edema, and warmth - Day 4/7-10 of Tx - Continue PO clinda  # A fib/flutter, SSS: Persistently in a fib with rvr of 110-120s. On amio and metop, amio increased yesterday given persistent a fib. - cardioverted after TEE yesterday but flipped back to afib later in the afternoon - Eliquis for anticoagulation for stroke prevention given A fib - monitor on tele - Cardiology on board- appreciate recommendations  - monitor with possible cardioversion Monday - PPM in place,  has appt OP for interrogation on 11/12  #Hypothyroidism, subclinical hypothyroidism: on synthroid in past.  - TSH 8.400 with normal free T3 and T4, likely sick euthyroid. Also consider toxicity from amiodarone. - recheck TSH in 2-3 months outpatient  FEN/GI: npo for TEE PPx: Eliquis as above  Disposition: Continue Step down status given resistant Afib with RVR  Subjective:  Patient states he feels better today, he is just "concerned about his numbers (HR) still being high and would like it to be lower before going home". No left ankle pain at all. No CP, SOB.  Objective: Temp:  [97.8 F (36.6 C)-98.9 F (37.2 C)] 98.4 F (36.9 C) (11/07 0749) Pulse Rate:  [54-113] 81 (11/07 0749) Resp:  [15-31] 19 (11/07 0800) BP: (83-133)/(37-81) 117/68 mmHg (11/07 0800) SpO2:  [92 %-98 %] 98 % (11/07 0800) Physical Exam: Gen: NAD, alert, cooperative with exam, sits up with only moderate effort HEENT: NCAT, EOMI, PERRL CV: Irregularly irregular, tachycardia at the time of exam, no murmur Resp: CTABL, no wheezes, non-labored Abd: SNTND, BS present, no guarding or organomegaly Ext: left lower extremity with erythema and swelling improved from previous exam, erythema has retracted from the line marked in his leg, approximately 2.5 cm circular bulla on his posterior left lower extremity Neuro: Alert and oriented, No gross deficits  Laboratory:  Recent Labs Lab 07/09/14 0339 07/10/14 0250 07/11/14 0240  WBC 12.4* 9.2 7.6  HGB 10.5* 10.9* 10.9*  HCT 32.6* 32.6* 33.2*  PLT 132* 133* 136*    Recent Labs Lab 07/08/14 1005 07/09/14 0339 07/10/14 0250 07/11/14 0240  NA 140 138  139 138  K 4.2 4.1 4.1 3.9  CL 103 106 109 105  CO2 22 21 18* 20  BUN 18 24* 18 15  CREATININE 1.52* 1.50* 1.35 1.17  CALCIUM 8.5 7.8* 8.1* 8.4  PROT 6.5 5.4*  --   --   BILITOT 1.8* 1.8*  --   --   ALKPHOS 57 47  --   --   ALT 11 14  --   --   AST 20 23  --   --   GLUCOSE 105* 117* 93 103*  TSH: 8.400 T3:  2.8 T4: 1.08  Urine cx: no growth to date  Blood cx: no growth to date x2  CSF:  Glucose: 63 Total protein: 55 RBC count: 1 WBC count: 2 Segmented neutro: none Appearance: clear Color: colorless  CSF gram stain: WBC PRESENT, PREDOMINANTLY MONONUCLEAR  NO ORGANISMS SEEN   CSF culture: no growth to date   Imaging/Diagnostic Tests: CXR (07/08/14): IMPRESSION: Hyperinflation consistent with reactive airway disease or COPD. There is no evidence of pneumonia, CHF, or other acute cardiopulmonary abnormality.  CXR (07/09/14): IMPRESSION: Superimposed on mild bilateral atelectasis, there is interval development of retrocardiac opacity. This is favored to represent atelectasis as well although the possibility of developing pneumonitis is not excluded.  Interval development of trace bilateral pleural effusions.  2D Echo:  Study Conclusions - Left ventricle: The cavity size was normal. Wall thickness was normal. Systolic function was normal. The estimated ejection fraction was in the range of 55% to 60%. Wall motion was normal; there were no regional wall motion abnormalities. - Pulmonary arteries: Systolic pressure was mildly increased. PA peak pressure: 31 mm Hg (S).  Elenora GammaSamuel L Chiyeko Ferre, MD 07/12/2014, 11:25 AM [facility] PGY-3 FPTS Intern pager: [phone], text pages welcome

## 2014-07-12 NOTE — Plan of Care (Signed)
Problem: Phase I Progression Outcomes Goal: Ventricular heart rate < 120/min Outcome: Completed/Met Date Met:  07/12/14 Goal: Anticoagulation Therapy per MD order Outcome: Completed/Met Date Met:  07/12/14 Goal: Pain controlled with appropriate interventions Outcome: Completed/Met Date Met:  07/12/14 Goal: Hemodynamically stable Outcome: Completed/Met Date Met:  07/12/14  Problem: Phase II Progression Outcomes Goal: Anticoagulation Therapy per MD order Outcome: Completed/Met Date Met:  07/12/14 Goal: Pain controlled Outcome: Completed/Met Date Met:  07/12/14 Goal: Progress activity as tolerated unless otherwise ordered Outcome: Completed/Met Date Met:  07/12/14 Goal: Tolerating diet Outcome: Completed/Met Date Met:  07/12/14

## 2014-07-12 NOTE — Progress Notes (Signed)
SUBJECTIVE:  No acute events overnight. Hypotension while asleep. Went back to a-fib with RVR after the DCCV yesterday, started on iv amiodarone load.  Telemetry with sustained atrial fibrillation.  He denies chest pain, dyspnea, lightheadedness, or bleeding. His left LE cellulitis continues to improve in addition to his mental status.  Appears weak.  OBJECTIVE:   Vitals:   Filed Vitals:   07/12/14 0400 07/12/14 0700 07/12/14 0749 07/12/14 0800  BP: 104/76 133/73 109/75 117/68  Pulse:   81   Temp:   98.4 F (36.9 C)   TempSrc:   Oral   Resp: 25 15 20 19   Height:      Weight:      SpO2: 98% 98% 98% 98%   I&O's:    Intake/Output Summary (Last 24 hours) at 07/12/14 [id number] Last data filed at 07/12/14 0700  Gross per 24 hour  Intake 1055.67 ml  Output   2650 ml  Net -1594.33 ml   TELEMETRY: Reviewed telemetry pt in atrial fibrillation:  PHYSICAL EXAM General: Well developed, well nourished, in no acute distress Head:   Normal cephalic and atramatic  Lungs:  Clear bilaterally to auscultation. Heart:  Irregularly irregular rhythm with normal heart rate   Abdomen: Abdomen soft and non-tender Msk:  Back normal,  Normal strength and tone for age. Extremities: Left LE with improved erythema, swelling, and warmth.   Neuro: Alert and oriented. Psych:  Normal affect, improved response  Skin: No rash  . amiodarone  400 mg Oral Daily  . apixaban  5 mg Oral BID  . clindamycin  450 mg Oral 4 times per day  . ferrous sulfate  325 mg Oral Q breakfast  . metoprolol tartrate  100 mg Oral BID  . sodium chloride  3 mL Intravenous Q12H  . tamsulosin  0.4 mg Oral Daily  . vitamin B-12  1,000 mcg Oral Daily   . amiodarone 30 mg/hr (07/12/14 0626)   LABS: Basic Metabolic Panel:  Recent Labs  11/91/4710/01/17 0250 07/11/14 0240  NA 139 138  K 4.1 3.9  CL 109 105  CO2 18* 20  GLUCOSE 93 103*  BUN 18 15  CREATININE 1.35 1.17  CALCIUM 8.1* 8.4    Recent Labs  07/10/14 0250  07/11/14 0240  WBC 9.2 7.6  HGB 10.9* 10.9*  HCT 32.6* 33.2*  MCV 90.1 89.2  PLT 133* 136*   C Recent Labs  07/09/14 1255 07/10/14 0250  TSH 8.400*  --   T3FREE  --  2.8   Anemia Panel:  Recent Labs  07/11/14 0240  VITAMINB12 846  FOLATE 10.0  FERRITIN 115  TIBC 263  IRON 25*  RETICCTPCT 1.3   Coag Panel:   No results found for: INR, PROTIME  RADIOLOGY: X-ray Chest Pa And Lateral  07/09/2014   CLINICAL DATA:  Sepsis, weakness, shortness of breath, subsequent evaluation   IMPRESSION: Superimposed on mild bilateral atelectasis, there is interval development of retrocardiac opacity. This is favored to represent atelectasis as well although the possibility of developing pneumonitis is not excluded.  Interval development of trace bilateral pleural effusions.       ASSESSMENT: Pt is a 68 yo man with pmh of afib/flutter, sick sinus syndrome with pacemaker, LV dysfunction, hyperlipidemia, chronic anemia, amiodarone-induced hypothyroidism, CKD Stage III, BPH, and GERD who was admitted on 11/3 with sepsis of unknown etiology found to have atrial [facility].   Plan:   1. Sustained Atrial Fibrillation with RVR-  Post  DCCV yesterday, back in a-fib, started on amiodarone load, now rate controlled,  Some hypotension at night, not now, we will continue the same dose of betablockers.  If remains in a-fib, we will consider DCCV on Monday.  Pt on eliquis 5 mg BID for AC therapy due to CHADS2 score of 2 (age and LV dysfunction) since 11/4.    2. Left LE Cellulitis - Improving. Afebrile with resolved leukocytosis. Doppler LE was negative for DVT. Antibiotics per primary team, currently on PO clindamycin. Blood cultures negative to date.  3. History of LV Dysfunction - 2D-echo with EF 55 to 60%, systolic CHF - negative fluid balance.     4. Sick Sinus Syndrome s/p implanted dual chamber pacemaker - Pacemaker placed in 2009, no signs of infection of implanted  pacemaker, doubt pacemaker infection. Blood cultures to date negative.  5. Amiodarone-induced Subclinical/Clinical Hypothyroidism  -T3 & T4 and normal. Pt currently on PO amiodarone 400 mg daily (was previously on 200 mg daily). Pt to have outpatient follow-up to recheck TSH level after resolution of acute illness and consider starting synthroid due to clinical symptoms (mental status slowing).  6. Normocytic Anemia - Hg stable at 11 with no active bleeding. Continue to monitor CBC [facility]. Pt with history of BRBPR due to internal hemorrhoids. Pt reports he has had colonoscopy in past 10 yrs revealing hemorrhoids. F/u anemia panel.  7. AKI on CKD Stage 3 - Cr improved to 1.17 from 1.52 on admission. Unknown baseline Cr. Continue to monitor.   Edward [name], Edward [name] H, MD  07/12/2014  8:29 AM

## 2014-07-12 NOTE — Progress Notes (Signed)
ANTICOAGULATION CONSULT NOTE  Pharmacy Consult for Eliquis Indication: atrial fibrillation  Allergies  Allergen Reactions  . Penicillins Rash    Patient rechallenged with Zosyn in Nov 2015 and developed abdominal rash within 12 hours.    Patient Measurements: Height: 5' 8.5" (174 cm) Weight: 210 lb 15.7 oz (95.7 kg) IBW/kg (Calculated) : 69.55 Heparin Dosing Weight:   Vital Signs: Temp: 98.4 F (36.9 C) (11/07 0749) Temp Source: Oral (11/07 0749) BP: 117/68 mmHg (11/07 0800) Pulse Rate: 81 (11/07 0749)  Labs:  Recent Labs  07/10/14 0250 07/11/14 0240  HGB 10.9* 10.9*  HCT 32.6* 33.2*  PLT 133* 136*  CREATININE 1.35 1.17    Estimated Creatinine Clearance: 66.5 mL/min (by C-G formula based on Cr of 1.17).   Medical History: Past Medical History  Diagnosis Date  . Sinoatrial node dysfunction   . Atrial fibrillation and flutter   . Anemia   . GERD (gastroesophageal reflux disease)   . HTN (hypertension)   . Internal hemorrhoids with complication     Medications:  Prescriptions prior to admission  Medication Sig Dispense Refill Last Dose  . aspirin 81 MG tablet Take 162 mg by mouth 2 (two) times daily.    07/08/2014 at 0800  . finasteride (PROSCAR) 5 MG tablet Take 5 mg by mouth every evening.    07/07/2014 at Unknown time  . KLOR-CON M10 10 MEQ tablet Take 10 mEq by mouth daily.    07/07/2014 at Unknown time  . metoprolol (LOPRESSOR) 50 MG tablet Take 100 mg by mouth 2 (two) times daily.    07/08/2014 at 0800  . PACERONE 200 MG tablet Take 100 mg by mouth daily.    07/08/2014 at Unknown time  . tamsulosin (FLOMAX) 0.4 MG CAPS capsule Take 0.4 mg by mouth daily.    07/07/2014 at 1400  . torsemide (DEMADEX) 10 MG tablet Take 10 mg by mouth daily.    07/07/2014 at Unknown time  . vitamin B-12 (CYANOCOBALAMIN) 1000 MCG tablet Take 1,000 mcg by mouth daily.   07/08/2014 at Unknown time    Assessment: 68 yo male on Eliquis for new onset AFib. Pt continues to be in AFib,  despite having DCCV yesterday.  Pt may undergo another cardioversion on Monday. Hgb and plts borderline low but stable, pt tolerating Eliquis well.   Goal of Therapy:  Monitor platelets by anticoagulation protocol: Yes   Plan:  Eliquis 5 mg po bid CBC q72h   Agapito GamesAlison Qusay Villada, PharmD, BCPS Clinical Pharmacist Pager: [phone] 07/12/2014 9:41 AM

## 2014-07-13 DIAGNOSIS — R6510 Systemic inflammatory response syndrome (SIRS) of non-infectious origin without acute organ dysfunction: Secondary | ICD-10-CM

## 2014-07-13 LAB — BASIC METABOLIC PANEL
ANION GAP: 12 (ref 5–15)
BUN: 15 mg/dL (ref 6–23)
CO2: 24 mEq/L (ref 19–32)
Calcium: 8.9 mg/dL (ref 8.4–10.5)
Chloride: 103 mEq/L (ref 96–112)
Creatinine, Ser: 1.17 mg/dL (ref 0.50–1.35)
GFR calc non Af Amer: 61 mL/min — ABNORMAL LOW (ref >90)
GFR, EST AFRICAN AMERICAN: 71 mL/min — AB (ref >90)
Glucose, Bld: 105 mg/dL — ABNORMAL HIGH (ref 70–99)
Potassium: 4.2 mEq/L (ref 3.7–5.3)
Sodium: 139 mEq/L (ref 137–147)

## 2014-07-13 LAB — CBC
HEMATOCRIT: 32.6 % — AB (ref 39.0–52.0)
Hemoglobin: 10.7 g/dL — ABNORMAL LOW (ref 13.0–17.0)
MCH: 29.5 pg (ref 26.0–34.0)
MCHC: 32.8 g/dL (ref 30.0–36.0)
MCV: 89.8 fL (ref 78.0–100.0)
Platelets: 192 10*3/uL (ref 150–400)
RBC: 3.63 MIL/uL — ABNORMAL LOW (ref 4.22–5.81)
RDW: 13.7 % (ref 11.5–15.5)
WBC: 7.7 10*3/uL (ref 4.0–10.5)

## 2014-07-13 MED ORDER — AMIODARONE HCL IN DEXTROSE 360-4.14 MG/200ML-% IV SOLN
30.0000 mg/h | INTRAVENOUS | Status: DC
  Administered 2014-07-13 – 2014-07-14 (×2): 30 mg/h via INTRAVENOUS
  Filled 2014-07-13 (×4): qty 200

## 2014-07-13 MED ORDER — METOPROLOL TARTRATE 50 MG PO TABS
50.0000 mg | ORAL_TABLET | Freq: Two times a day (BID) | ORAL | Status: DC
  Administered 2014-07-13 – 2014-07-14 (×4): 50 mg via ORAL
  Filled 2014-07-13 (×6): qty 1

## 2014-07-13 MED ORDER — AMIODARONE HCL IN DEXTROSE 360-4.14 MG/200ML-% IV SOLN
60.0000 mg/h | INTRAVENOUS | Status: AC
  Administered 2014-07-13: 60 mg/h via INTRAVENOUS
  Filled 2014-07-13: qty 200

## 2014-07-13 NOTE — Evaluation (Signed)
Physical Therapy Evaluation Patient Details Name: Edward [name] MRN: [id number] DOB: 18-Aug-1946 Today's Date: 07/13/2014   History of Present Illness  Edward Setterslbert C Blanks is a 68 y.o. male presenting with SIRS and AMS . PMH is significant for sinoatrial node dysfunction with pacemaker, a fib, LV dysfunction, GERD, HTN.; PT eval had to be held multiple times due to tachycardia  Clinical Impression   Pt admitted with above. Pt currently with functional limitations due to the deficits listed below (see PT Problem List).  Pt will benefit from skilled PT to increase their independence and safety with mobility to allow discharge to the venue listed below.   Likely for cardioversion tomorrow; hopeful for good solid progress with functional mobility  Once HR is controlled     Follow Up Recommendations Home health PT;Supervision/Assistance - 24 hour    Equipment Recommendations  Rolling walker with 5" wheels;3in1 (PT)    Recommendations for Other Services OT consult     Precautions / Restrictions Precautions Precautions: Fall Precaution Comments: watch HR closely      Mobility  Bed Mobility                  Transfers Overall transfer level: Needs assistance Equipment used: Rolling walker (2 wheeled) Transfers: Sit to/from Stand Sit to Stand: Min guard         General transfer comment: Cues for safety and hand placement; good rise to standing with use of UEs to push; noted significant decr control of descent with stand to sit  Ambulation/Gait             General Gait Details: Held gait due to continued high and erratic HR  Stairs            Wheelchair Mobility    Modified Rankin (Stroke Patients Only)       Balance Overall balance assessment: Needs assistance         Standing balance support: Bilateral upper extremity supported Standing balance-Leahy Scale: Poor Standing balance comment: Deendent on UE support; able to stand with UE support on  RW the greater part of session with close monitoring of HR and cues to self-monitor for activity tolerance                             Pertinent Vitals/Pain Pain Assessment: No/denies pain    Home Living Family/patient expects to be discharged to:: Private residence Living Arrangements: Spouse/significant other Available Help at Discharge: Family;Available 24 hours/day (Not sure exaclty how much physical assist wife can give) Type of Home: Apartment Home Access: Level entry     Home Layout: Two level;Other (Comment);1/2 bath on main level (they sleep on ground floor; shower is upstairs) Home Equipment: Cane - single point      Prior Function Level of Independence: Independent with assistive device(s)               Hand Dominance        Extremity/Trunk Assessment   Upper Extremity Assessment: Generalized weakness           Lower Extremity Assessment: Generalized weakness         Communication   Communication: No difficulties  Cognition Arousal/Alertness: Awake/alert Behavior During Therapy: WFL for tasks assessed/performed Overall Cognitive Status: Within Functional Limits for tasks assessed                      General Comments General comments (skin integrity,  edema, etc.): HR ranged from 100 to 140 during session    Exercises        Assessment/Plan    PT Assessment Patient needs continued PT services  PT Diagnosis Difficulty walking;Generalized weakness   PT Problem List Decreased strength;Decreased activity tolerance;Decreased balance;Decreased mobility;Decreased knowledge of use of DME;Cardiopulmonary status limiting activity;Decreased knowledge of precautions  PT Treatment Interventions DME instruction;Gait training;Stair training;Functional mobility training;Therapeutic activities;Therapeutic exercise;Balance training;Patient/family education   PT Goals (Current goals can be found in the Care Plan section) Acute Rehab PT  Goals Patient Stated Goal: did not state PT Goal Formulation: With patient Time For Goal Achievement: 07/27/14 Potential to Achieve Goals: Good    Frequency Min 3X/week   Barriers to discharge        Co-evaluation               End of Session   Activity Tolerance: Other (comment) (limited by erratic and high HR) Patient left: in chair;with call bell/phone within reach Nurse Communication: Mobility status         Time: [id number] PT Time Calculation (min): 25 min   Charges:   PT Evaluation $Initial PT Evaluation Tier I: 1 Procedure PT Treatments $Therapeutic Activity: 8-22 mins   PT G Codes:          Olen PelGarrigan, Marsi Turvey Hamff 07/13/2014, 6:35 PM  Van ClinesHolly Marykay Mccleod, [place]  Acute Rehabilitation Services Pager [phone] Office [phone]

## 2014-07-13 NOTE — Progress Notes (Signed)
SUBJECTIVE:  No acute events overnight. He underwent DCCV on Friday, went back to a-fib with RVR shortly after, he was started on iv amiodarone load. He spontaneously cardioverted to SR yesterday but went back to a-fib with RVR at night, now rate controlled.   He denies chest pain, dyspnea, lightheadedness, or bleeding. His left LE cellulitis continues to improve in addition to his mental status.    OBJECTIVE:   Vitals:   Filed Vitals:   07/12/14 2200 07/12/14 2300 07/13/14 0000 07/13/14 0400  BP: 120/52 100/60 107/56 115/68  Pulse:      Temp:   98.4 F (36.9 C) 98 F (36.7 C)  TempSrc:   Oral Oral  Resp: 22 18 23 15   Height:      Weight:      SpO2: 99% 99% 97% 97%   I&O's:    Intake/Output Summary (Last 24 hours) at 07/13/14 0655 Last data filed at 07/13/14 0600  Gross per 24 hour  Intake 1736.9 ml  Output   2475 ml  Net -738.1 ml   TELEMETRY: Reviewed telemetry pt in atrial fibrillation:  PHYSICAL EXAM General: Well developed, well nourished, in no acute distress Head:   Normal cephalic and atramatic  Lungs:  Clear bilaterally to auscultation. Heart:  Irregularly irregular rhythm with normal heart rate   Abdomen: Abdomen soft and non-tender Msk:  Back normal,  Normal strength and tone for age. Extremities: Left LE with improved erythema, swelling, and warmth.   Neuro: Alert and oriented. Psych:  Normal affect, improved response  Skin: No rash  . amiodarone  400 mg Oral Daily  . antiseptic oral rinse  7 mL Mouth Rinse BID  . apixaban  5 mg Oral BID  . clindamycin  450 mg Oral 4 times per day  . ferrous sulfate  325 mg Oral Q breakfast  . metoprolol tartrate  100 mg Oral BID  . sodium chloride  3 mL Intravenous Q12H  . tamsulosin  0.4 mg Oral Daily  . vitamin B-12  1,000 mcg Oral Daily     LABS: Basic Metabolic Panel:  Recent Labs  78/29/5610/02/17 0240 07/13/14 0301  NA 138 139  K 3.9 4.2  CL 105 103  CO2 20 24  GLUCOSE 103* 105*  BUN 15 15    CREATININE 1.17 1.17  CALCIUM 8.4 8.9    Recent Labs  07/11/14 0240 07/13/14 0301  WBC 7.6 7.7  HGB 10.9* 10.7*  HCT 33.2* 32.6*  MCV 89.2 89.8  PLT 136* 192   No results for input(s): TSH, T4TOTAL, T3FREE, THYROIDAB in the last 72 hours.  Invalid input(s): FREET3 Anemia Panel:  Recent Labs  07/11/14 0240  VITAMINB12 846  FOLATE 10.0  FERRITIN 115  TIBC 263  IRON 25*  RETICCTPCT 1.3   Coag Panel:   No results found for: INR, PROTIME  RADIOLOGY: X-ray Chest Pa And Lateral  07/09/2014   CLINICAL DATA:  Sepsis, weakness, shortness of breath, subsequent evaluation   IMPRESSION: Superimposed on mild bilateral atelectasis, there is interval development of retrocardiac opacity. This is favored to represent atelectasis as well although the possibility of developing pneumonitis is not excluded.  Interval development of trace bilateral pleural effusions.     Telemetry - paced rhythm --> a-fib with RVR   ASSESSMENT: Pt is a 68 yo man with pmh of afib/flutter, sick sinus syndrome with pacemaker, LV dysfunction, hyperlipidemia, chronic anemia, amiodarone-induced hypothyroidism, CKD Stage III, BPH, and GERD who was admitted on 11/3 with  sepsis of unknown etiology found to have atrial fibrillation [facility].     Plan:   1. Paroxysmal Atrial Fibrillation with RVR-  He underwent DCCV on Friday, went back to a-fib with RVR shortly after, he was started on iv amiodarone load. We will continue iv amiodarone till tomorrow and same dose of betablockers.  Pt on eliquis 5 mg BID for AC therapy due to CHADS2 score of 2 (age and LV dysfunction) since 11/4.    2. Left LE Cellulitis - Improving. Afebrile with resolved leukocytosis. Doppler LE was negative for DVT. Antibiotics per primary team, currently on PO clindamycin. Blood cultures negative to date.  3. History of LV Dysfunction - 2D-echo with EF 55 to 60%, systolic CHF - negative fluid balance.     4. Sick Sinus Syndrome  s/p implanted dual chamber pacemaker - Pacemaker placed in 2009, no signs of infection of implanted pacemaker, doubt pacemaker infection. Blood cultures to date negative.  5. Amiodarone-induced Subclinical/Clinical Hypothyroidism  -T3 & T4 and normal. Pt currently on PO amiodarone 400 mg daily (was previously on 200 mg daily). Pt to have outpatient follow-up to recheck TSH level after resolution of acute illness and consider starting synthroid due to clinical symptoms (mental status slowing).  6. Normocytic Anemia - Hg stable at 11 with no active bleeding. Continue to monitor CBC in setting of starting [facility] therapy. Pt with history of BRBPR due to internal hemorrhoids. Pt reports he has had colonoscopy in past 10 yrs revealing hemorrhoids. F/u anemia panel.  7. AKI on CKD Stage 3 - Cr improved to 1.17 from 1.52 on admission. Unknown baseline Cr. Continue to monitor.   Edward MassonNELSON, Edward Nevils H, MD  07/13/2014  6:55 AM

## 2014-07-13 NOTE — Progress Notes (Signed)
Pt is scheduled for 100mg  of metoprolol and BP is 106/45, HR 62. Dr Ermalinda MemosBradshaw paged, ordered to only give 50mg  now and call back if pt becomes tachycardic. Will monitor and update with changes.

## 2014-07-13 NOTE — Progress Notes (Signed)
Family Medicine Teaching Service Daily Progress Note Intern Pager: [phone]  Patient name: Edward [name] Medical record number: [id number] Date of birth: 1946/08/20 Age: 68 y.o. Gender: male  Primary Care Provider: No primary care provider on file.[facility] Consultants: none Code Status: FULL  Pt Overview and Major Events to Date:  07/08/14: patient admitted with sirs without clear source; got LP by IR that had neg gram stain; blood, urine, and CSF cx pending; started vanc/zosyn then zosyn switched to aztreonam due development of rash 07/09/14: restarted home a fib meds and started on eliquis for anticoag; LE dopplers done to rule out DVT and negative; TTE done showing normal EF 07/10/14: amiodarone increased; abx narrowed to po clinda to cover cellulitis and possible mrsa; he remained afebrile 11/6 TEE, cardioversion, NSR which returned to Afib later in the day 11/7- in and out of Afib with RVR  Assessment and Plan: Edward Setterslbert C Kain is a 68 y.o. male presenting with sepsis and AMS . PMH is significant for sinoatrial node dysfunction with pacemaker, a fib, LV dysfunction, GERD, HTN, hypothyroidism.  # Sepsis: Resolved, source cellulitis - initially vanc/aztreonam and transitioned to PO clinda - blood cxs no growth to date - CSF cx no growth to date - urine cx no growth to date - TTE: EF of 55-60% - monitor on tele  - incentive spirometry for atelectasis  # LE cellulitis: Improving. - continue to monitor resolution of erythema, edema, and warmth - Day 5/7-10 of Tx - Continue PO clinda  # A fib/flutter, SSS:  - In and out of NSR and Afub RVR yesterday, currently rate 100 and in Afib - On amio and metoprolol, decreased the metop due to soft pressures  - dizziness with standing this am, may need to decrease further as the amio-tank is filled up - cardioverted after TEE friday but flipped back to afib later in the afternoon - Eliquis for anticoagulation for  stroke prevention given A fib - monitor on tele - Cardiology on board- appreciate recommendations  - monitor with possible cardioversion Monday  - Ok with lowering metop and relying more on amio - PPM in place, has appt OP for interrogation on 11/12  #Hypothyroidism, subclinical hypothyroidism: on synthroid in past.  - TSH 8.400 with normal free T3 and T4, likely sick euthyroid. Also consider toxicity from amiodarone. - recheck TSH in 2-3 months outpatient  FEN/GI: npo for TEE PPx: Eliquis as above  Disposition: Continue Step down status given resistant Afib with RVR  Subjective:  Continues to feel better but had epsiode of extreme dizziness when standing to get in the chair this am. No pain in foot.   Objective: Temp:  [97.2 F (36.2 C)-98.4 F (36.9 C)] 97.3 F (36.3 C) (11/08 0728) Resp:  [15-31] 16 (11/08 0728) BP: (96-135)/(45-87) 135/77 mmHg (11/08 0728) SpO2:  [95 %-100 %] 98 % (11/08 0728) Physical Exam: Gen: NAD, alert, cooperative with exam, sitting up in his chair HEENT: NCAT CV: Irregularly irregular, tachy, no murmur Resp: CTABL, no wheezes, non-labored Abd: SNTND, BS present, no guarding or organomegaly Ext: left lower extremity with erythema and swelling improved from previous exam, erythema has retracted from the line marked in his leg, approximately 2.5 cm circular bulla on his posterior left lower extremity Neuro: Alert and oriented, No gross deficits  Laboratory:  Recent Labs Lab 07/10/14 0250 07/11/14 0240 07/13/14 0301  WBC 9.2 7.6 7.7  HGB 10.9* 10.9* 10.7*  HCT 32.6* 33.2* 32.6*  PLT 133*  136* 192    Recent Labs Lab 07/08/14 1005 07/09/14 0339 07/10/14 0250 07/11/14 0240 07/13/14 0301  NA 140 138 139 138 139  K 4.2 4.1 4.1 3.9 4.2  CL 103 106 109 105 103  CO2 22 21 18* 20 24  BUN 18 24* 18 15 15   CREATININE 1.52* 1.50* 1.35 1.17 1.17  CALCIUM 8.5 7.8* 8.1* 8.4 8.9  PROT 6.5 5.4*  --   --   --   BILITOT 1.8* 1.8*  --   --   --    ALKPHOS 57 47  --   --   --   ALT 11 14  --   --   --   AST 20 23  --   --   --   GLUCOSE 105* 117* 93 103* 105*  TSH: 8.400 T3: 2.8 T4: 1.08  Urine cx: no growth to date  Blood cx: no growth to date x2  CSF:  Glucose: 63 Total protein: 55 RBC count: 1 WBC count: 2 Segmented neutro: none Appearance: clear Color: colorless  CSF gram stain: WBC PRESENT, PREDOMINANTLY MONONUCLEAR  NO ORGANISMS SEEN   CSF culture: no growth to date   Imaging/Diagnostic Tests: CXR (07/08/14): IMPRESSION: Hyperinflation consistent with reactive airway disease or COPD. There is no evidence of pneumonia, CHF, or other acute cardiopulmonary abnormality.  CXR (07/09/14): IMPRESSION: Superimposed on mild bilateral atelectasis, there is interval development of retrocardiac opacity. This is favored to represent atelectasis as well although the possibility of developing pneumonitis is not excluded.  Interval development of trace bilateral pleural effusions.  2D Echo:  Study Conclusions - Left ventricle: The cavity size was normal. Wall thickness was normal. Systolic function was normal. The estimated ejection fraction was in the range of 55% to 60%. Wall motion was normal; there were no regional wall motion abnormalities. - Pulmonary arteries: Systolic pressure was mildly increased. PA peak pressure: 31 mm Hg (S).  Elenora GammaSamuel L Bradshaw, MD 07/13/2014, 10:06 AM [facility] PGY-3 FPTS Intern pager: [phone], text pages welcome

## 2014-07-14 ENCOUNTER — Encounter (HOSPITAL_COMMUNITY): Payer: Self-pay | Admitting: Internal Medicine

## 2014-07-14 DIAGNOSIS — I4891 Unspecified atrial fibrillation: Secondary | ICD-10-CM | POA: Insufficient documentation

## 2014-07-14 DIAGNOSIS — Z950 Presence of cardiac pacemaker: Secondary | ICD-10-CM

## 2014-07-14 DIAGNOSIS — Z7901 Long term (current) use of anticoagulants: Secondary | ICD-10-CM | POA: Insufficient documentation

## 2014-07-14 DIAGNOSIS — I5032 Chronic diastolic (congestive) heart failure: Secondary | ICD-10-CM | POA: Diagnosis present

## 2014-07-14 LAB — CULTURE, BLOOD (ROUTINE X 2)
CULTURE: NO GROWTH
Culture: NO GROWTH

## 2014-07-14 LAB — CSF CULTURE W GRAM STAIN

## 2014-07-14 LAB — CSF CULTURE: CULTURE: NO GROWTH

## 2014-07-14 MED ORDER — TORSEMIDE 5 MG PO TABS
5.0000 mg | ORAL_TABLET | Freq: Every day | ORAL | Status: DC
  Administered 2014-07-14 – 2014-07-15 (×2): 5 mg via ORAL
  Filled 2014-07-14 (×3): qty 1

## 2014-07-14 MED ORDER — AMIODARONE HCL 200 MG PO TABS
400.0000 mg | ORAL_TABLET | Freq: Two times a day (BID) | ORAL | Status: DC
  Administered 2014-07-14 – 2014-07-15 (×3): 400 mg via ORAL
  Filled 2014-07-14 (×4): qty 2

## 2014-07-14 NOTE — Progress Notes (Signed)
Family Medicine Teaching Service Daily Progress Note Intern Pager: [phone]  Patient name: Edward [name] Medical record number: [id number] Date of birth: [birth year] Age: 68 y.o. Gender: male  Primary Care Provider: No primary care provider on file.[facility] Consultants: none Code Status: FULL  Pt Overview and Major Events to Date:  07/08/14: patient admitted with sirs without clear source; got LP by IR that had neg gram stain; blood, urine, and CSF cx pending; started vanc/zosyn then zosyn switched to aztreonam due development of rash 07/09/14: restarted home a fib meds and started on eliquis for anticoag; LE dopplers done to rule out DVT and negative; TTE done showing normal EF 07/10/14: amiodarone increased; abx narrowed to po clinda to cover cellulitis and possible mrsa; he remained afebrile 11/6 TEE, cardioversion, NSR which returned to Afib later in the day; IV amio load 11/7- in and out of Afib with RVR  Assessment and Plan: Edward [name] is a 68 y.o. male presenting with sepsis and AMS . PMH is significant for sinoatrial node dysfunction with pacemaker, a fib, LV dysfunction, GERD, HTN, hypothyroidism.  # Sepsis: Resolved, source cellulitis - initially vanc/aztreonam and transitioned to PO clinda (has been afebrile) - blood cxs no growth to date - CSF cx no growth to date - urine cx: negative  - TTE: EF of 55-60%  - monitor on tele  - incentive spirometry for atelectasis  # LE cellulitis: Improving. - continue to monitor resolution of erythema, edema, and warmth - Day 6/7-10 of Tx - Continue PO clinda  # A fib/flutter, SSS:  - In and out of NSR and Afib RVR, currently NSR - Amio drip discontinued this AM with first dose of amio 400mg  BID  - On amio (increased to 400mg  BID this AM) and metoprolol, decreased the metop ton 50mg  BID due to soft pressures - cardioverted after TEE friday but flipped back to afib later in the afternoon - Eliquis for  anticoagulation for stroke prevention given A fib - monitor on tele - Cardiology on board- appreciate recommendations  - monitor with possible cardioversion Monday, however not currently in afib with RVR - PPM in place, has appt OP for interrogation on 11/12  #Hypothyroidism, subclinical hypothyroidism: on synthroid in past.  - TSH 8.400 with normal free T3 and T4, likely sick euthyroid. Also consider toxicity from amiodarone. - recheck TSH in 2-3 months outpatient  FEN/GI: Heart healthy; IV saline lock  PPx: Eliquis as above  Disposition: Continue Step down status given resistant Afib with RVR  Subjective:  Patient doing well. Having some heat palpitations overnight. No chest pain, SOB.   Objective: Temp:  [97.7 F (36.5 C)-98.1 F (36.7 C)] 98.1 F (36.7 C) (11/09 0000) Resp:  [15-30] 15 (11/09 0600) BP: (90-139)/(45-99) 121/63 mmHg (11/09 0600) SpO2:  [86 %-100 %] 99 % (11/09 0600)  Physical Exam: Gen: NAD, alert, cooperative with exam, sitting up in his chair HEENT: NCAT CV: Irregularly irregular, tachy, no murmur Resp: CTABL, no wheezes, non-labored Abd: SNTND, BS present, no guarding or organomegaly Ext: left lower extremity with erythema and swelling improved from previous exam, erythema has retracted from the line marked in his leg, approximately 2.5 cm circular bulla on his posterior left lower extremity Neuro: Alert and oriented, No gross deficits  Laboratory:  Recent Labs Lab 07/10/14 0250 07/11/14 0240 07/13/14 0301  WBC 9.2 7.6 7.7  HGB 10.9* 10.9* 10.7*  HCT 32.6* 33.2* 32.6*  PLT 133* 136* 192    Recent  Labs Lab 07/08/14 1005 07/09/14 0339 07/10/14 0250 07/11/14 0240 07/13/14 0301  NA 140 138 139 138 139  K 4.2 4.1 4.1 3.9 4.2  CL 103 106 109 105 103  CO2 22 21 18* 20 24  BUN 18 24* 18 15 15   CREATININE 1.52* 1.50* 1.35 1.17 1.17  CALCIUM 8.5 7.8* 8.1* 8.4 8.9  PROT 6.5 5.4*  --   --   --   BILITOT 1.8* 1.8*  --   --   --   ALKPHOS 57 47   --   --   --   ALT 11 14  --   --   --   AST 20 23  --   --   --   GLUCOSE 105* 117* 93 103* 105*  TSH: 8.400 T3: 2.8 T4: 1.08  Urine cx: no growth to date  Blood cx: no growth to date x2  CSF:  Glucose: 63 Total protein: 55 RBC count: 1 WBC count: 2 Segmented neutro: none Appearance: clear Color: colorless  CSF gram stain: WBC PRESENT, PREDOMINANTLY MONONUCLEAR  NO ORGANISMS SEEN   CSF culture: no growth to date  Imaging/Diagnostic Tests: CXR (07/08/14): IMPRESSION: Hyperinflation consistent with reactive airway disease or COPD. There is no evidence of pneumonia, CHF, or other acute cardiopulmonary abnormality.  CXR (07/09/14): IMPRESSION: Superimposed on mild bilateral atelectasis, there is interval development of retrocardiac opacity. This is favored to represent atelectasis as well although the possibility of developing pneumonitis is not excluded.  Interval development of trace bilateral pleural effusions.  2D Echo:  Study Conclusions - Left ventricle: The cavity size was normal. Wall thickness was normal. Systolic function was normal. The estimated ejection fraction was in the range of 55% to 60%. Wall motion was normal; there were no regional wall motion abnormalities. - Pulmonary arteries: Systolic pressure was mildly increased. PA peak pressure: 31 mm Hg (S).  Joanna Puffrystal S Dorsey, MD 07/14/2014, 7:32 AM [facility] PGY-1 FPTS Intern pager: [phone], text pages welcome

## 2014-07-14 NOTE — Progress Notes (Addendum)
SUBJECTIVE:  Pt seen and examined in AM. No acute events overnight. Pt is currently in atrial paced rhythm and normal HR on IV amiodarone and lopressor. He denies chest pain, dyspnea, palpitations, lightheadedness, or syncope.   Leg feels much better.  OBJECTIVE:   Vitals:   Filed Vitals:   07/14/14 0400 07/14/14 0500 07/14/14 0600 07/14/14 0800  BP: 119/99 108/61 121/63 124/62  Pulse:      Temp:    98 F (36.7 C)  TempSrc:    Oral  Resp: 18 22 15 15   Height:      Weight:      SpO2: 100% 97% 99% 100%   I&O's:    Intake/Output Summary (Last 24 hours) at 07/14/14 1114 Last data filed at 07/14/14 0900  Gross per 24 hour  Intake  878.8 ml  Output   3375 ml  Net -2496.2 ml   TELEMETRY: Reviewed telemetry pt atrial paced rhythm:     PHYSICAL EXAM General: Well developed, well nourished, in no acute distress Head:   Normal cephalic and atramatic  Lungs:  Clear bilaterally to auscultation. Heart:  HRRR S1 S2  No JVD.   Abdomen: Abdomen soft and non-tender Msk:  Back normal,  Normal strength and tone for age. Extremities:  Trace left LE edema.  Left LE with resolving cellulitis. (notably reduced erythema, small blister ~1 cm diameter) Neuro: Alert and oriented. Psych:  Normal affect, delay in response to questions Skin: No rash  LABS: Basic Metabolic Panel:  Recent Labs  82/95/6210/04/19 0301  NA 139  K 4.2  CL 103  CO2 24  GLUCOSE 105*  BUN 15  CREATININE 1.17  CALCIUM 8.9   Liver Function Tests: No results for input(s): AST, ALT, ALKPHOS, BILITOT, PROT, ALBUMIN in the last 72 hours. No results for input(s): LIPASE, AMYLASE in the last 72 hours. CBC:  Recent Labs  07/13/14 0301  WBC 7.7  HGB 10.7*  HCT 32.6*  MCV 89.8  PLT 192   Cardiac Enzymes: No results for input(s): CKTOTAL, CKMB, CKMBINDEX, TROPONINI in the last 72 hours. BNP: Invalid input(s): POCBNP D-Dimer: No results for input(s): DDIMER in the last 72 hours. Hemoglobin A1C: No results for  input(s): HGBA1C in the last 72 hours. Fasting Lipid Panel: No results for input(s): CHOL, HDL, LDLCALC, TRIG, CHOLHDL, LDLDIRECT in the last 72 hours. Thyroid Function Tests: No results for input(s): TSH, T4TOTAL, T3FREE, THYROIDAB in the last 72 hours.  Invalid input(s): FREET3 Anemia Panel: No results for input(s): VITAMINB12, FOLATE, FERRITIN, TIBC, IRON, RETICCTPCT in the last 72 hours. Coag Panel:   No results found for: INR, PROTIME  RADIOLOGY: X-ray Chest Pa And Lateral  07/09/2014   CLINICAL DATA:  Sepsis, weakness, shortness of breath, subsequent evaluation  EXAM: CHEST  2 VIEW  COMPARISON:  07/08/2014  FINDINGS: The heart size and vascular pattern are normal. Two lead cardiac pacer with generator over left thorax stable. Vascular pattern is normal. Mild scattered subsegmental foci of atelectasis mid to lower lung zones. Trace pleural effusion. Stable mild hyperinflation. Interval development of increased opacity in the retrocardiac portion of the left lower lobe.  IMPRESSION: Superimposed on mild bilateral atelectasis, there is interval development of retrocardiac opacity. This is favored to represent atelectasis as well although the possibility of developing pneumonitis is not excluded.  Interval development of trace bilateral pleural effusions.   Electronically Signed   By: Esperanza Heiraymond  Rubner M.D.   On: 07/09/2014 08:34   Dg Chest 2 View  07/08/2014   CLINICAL DATA:  Altered mental status and fever for 2 days  EXAM: CHEST  2 VIEW  COMPARISON:  PA and lateral chest of July 16, 2004  FINDINGS: The lungs are mildly hyperinflated but clear. The heart and pulmonary vascularity are normal. The permanent pacemaker is in appropriate position radiographically. There is no pleural effusion or pneumothorax. The observed bony thorax is unremarkable.  IMPRESSION: Hyperinflation consistent with reactive airway disease or COPD. There is no evidence of pneumonia, CHF, or other acute cardiopulmonary  abnormality.   Electronically Signed   By: Rigdon Macomber  [place]   On: 07/08/2014 09:44   Ct Head Wo Contrast  07/08/2014   CLINICAL DATA:  68 year old male with altered mental status. Fever for 2 days. Initial encounter.  EXAM: CT HEAD WITHOUT CONTRAST  TECHNIQUE: Contiguous axial images were obtained from the base of the skull through the vertex without intravenous contrast.  COMPARISON:  None.  FINDINGS: No intracranial hemorrhage.  No CT evidence of large acute infarct.  No hydrocephalus.  Cerebellar tonsils minimally low lying.  No intracranial mass lesion noted on this unenhanced exam.  Partial opacification inferior aspect left sphenoid sinus.  Partially empty sella.  Orbital structures unremarkable.  IMPRESSION: No intracranial hemorrhage.  No CT evidence of large acute infarct.  Cerebellar tonsils minimally low lying.  No intracranial mass lesion noted on this unenhanced exam.  Partial opacification inferior aspect left sphenoid sinus.   Electronically Signed   By: Bridgett LarssonSteve  Olson M.D.   On: 07/08/2014 09:47   Dg Fluoro Guide Lumbar Puncture  07/08/2014   CLINICAL DATA:  Fever.  Sepsis.  Altered mental status.  EXAM: DIAGNOSTIC LUMBAR PUNCTURE UNDER FLUOROSCOPIC GUIDANCE  FLUOROSCOPY TIME:  0 min 43 seconds  PROCEDURE: Informed consent was obtained from the patient prior to the procedure, including potential complications of headache, allergy, and pain. With the patient prone, the lower back was prepped with Betadine. 1% Lidocaine was used for local anesthesia. Lumbar puncture was performed at the L3-4 level using a 20 gauge needle with return of clear CSF with an opening pressure of 16 cm water. 10ml of CSF were obtained for laboratory studies. The patient tolerated the procedure well and there were no apparent complications.  IMPRESSION: Successful diagnostic lumbar puncture performed under fluoroscopic guidance. No evidence immediate complication.   Electronically Signed   By: Myles RosenthalJohn  Stahl M.D.   On:  07/08/2014 18:06    Principal Problem:   Cellulitis of leg, left Active Problems:   Sepsis   Atrial fibrillation and flutter   Blood poisoning   Cardiac pacemaker   Septic encephalopathy   SIRS (systemic inflammatory response syndrome)   Chronic diastolic heart failure   Left ventricular dysfunction   Altered mental status  ASSESSMENT: Pt is a 68 yo man with pmh of afib/flutter, sick sinus syndrome with pacemaker, LV dysfunction, hyperlipidemia, chronic anemia, amiodarone-induced hypothyroidism, CKD Stage III, BPH, and GERD who was admitted on 11/3 for sepsis due to  left LE cellulitis found to have sustained atrial [facility].   Plan:  Atrial Fibrillation s/p TEE with cardioversion on 11/7 - Currently in atrial paced rhythm with normal HR.   Has been on IV amiodarone since 11/7, will transition to PO amiodarone 400 mg BID for 1 week then decrease to daily x 1 week before returning to home dose of ~100-200 mg daily.   Consider increasing lopressor from 50 mg BID to 100 mg BID (home dose) as amiodarone dose decreases.  Pt on eliquis 5 mg BID for AC therapy due to CHADS2 score of 2 (age and LV dysfunction) .  Left LE Cellulitis - Resolving. Doppler LE was negative for DVT. Currently on PO clindamycin 450 mg Q 6hr. Blood cultures negative. History of LV Dysfunction Chronic DHF- 2D-echo with EF 55 to 60%.   Will add torsemide 5 mg daily (at home on 10 mg daily).   Sick Sinus Syndrome s/p implanted dual chamber pacemaker - Pacemaker placed in 2009. TEE with no vegetations. Blood cultures negative. Amiodarone-induced Subclinical Hypothyroidism -T3 & T4 and normal. Pt to have outpatient follow-up to recheck TSH level after resolution of acute illness and consider starting synthroid due to clinical symptoms (mental status slowing).  Normocytic Anemia - Hg stable with no active bleeding. Continue to monitor CBC in setting of starting [facility] therapy. Pt with history  of BRBPR due to internal hemorrhoids. Pt reports he has had colonoscopy in past 10 yrs revealing hemorrhoids. Anemia panel with normal ferritin however low iron and low TIBC.  AKI on CKD Stage 3 -  Cr improved to 1.17 from 1.52 on admission. Unknown baseline Cr. Continue to monitor.   Stable for transfer out of CCU from cardiac standpoint   Otis Brace, MD  PGY-II IMTS  Pager [phone] 07/14/2014  11:14 AM  I have seen and evaluated the patient this AM along with Otis Brace, MD and have personally reviewed the available data & chart.   I agree with her findings, examination as well as impression recommendations as discussed this AM on ICU rounds. Itatlics are my amendments.  Stable from an Afib standpoint - back in ~paced rhythm. Ready to transition to PO Amiodarone (to complete load as recommended). Continue Eliquis for Anticoagulation (minimum of 1 month s/p DCCV) -- defer to primary Cardiologist (Dr. Trinna Post Paraschos @ [facility], [place]). --> Please make sure that notes/ records are forwarded to him. As dose decreases, would increase BB back to previous home dose. With mild LE edema - agree with restarting home diuretic @ ~1/2 dose.   MD Time with pt: 15 min  Christop Hippert W, M.D., M.S. Interventional Cardiologist   Pager # [phone]

## 2014-07-15 MED ORDER — APIXABAN 5 MG PO TABS: 5.0000 mg | ORAL_TABLET | Freq: Two times a day (BID) | ORAL | Status: AC

## 2014-07-15 MED ORDER — TORSEMIDE 5 MG PO TABS: 5.0000 mg | ORAL_TABLET | Freq: Every day | ORAL | Status: AC

## 2014-07-15 MED ORDER — AMIODARONE HCL 200 MG PO TABS: 100.0000 mg | ORAL_TABLET | Freq: Two times a day (BID) | ORAL | Status: AC

## 2014-07-15 MED ORDER — METOPROLOL TARTRATE 50 MG PO TABS: 50.0000 mg | ORAL_TABLET | Freq: Two times a day (BID) | ORAL | Status: DC

## 2014-07-15 MED ORDER — AMIODARONE HCL 400 MG PO TABS: ORAL_TABLET | ORAL | Status: DC

## 2014-07-15 MED ORDER — FERROUS SULFATE 325 (65 FE) MG PO TABS: 325.0000 mg | ORAL_TABLET | Freq: Every day | ORAL | Status: DC

## 2014-07-15 NOTE — Progress Notes (Signed)
Discharge instructions reviewed with patient. No others concerns voice at this time. Awaiting on transportation.  Sim BoastHavy, RN

## 2014-07-15 NOTE — Discharge Instructions (Signed)
Great to meet you Mr,. Edward [name],   You are finished with your antibiotics Be sure to keep your follow up appointmentt with your heart doctor.   Your new medication this admission is eliquis, a blood thinner.   For your amiodarone you have special instructions, you have 2 prescriptions, please understand this prior to leaving     Take 400 mg twice daily for 1 week     Take 400 mg once daily for 1 week     Take 100 mg (1/2 tablet, a separate prescription) twice daily from then on   Atrial Fibrillation Atrial fibrillation is a condition that causes your heart to beat irregularly. It may also cause your heart to beat faster than normal. Atrial fibrillation can prevent your heart from pumping blood normally. It increases your risk of stroke and heart problems. HOME CARE  Take medications as told by your doctor.  Only take medications that your doctor says are safe. Some medications can make the condition worse or happen again.  If blood thinners were prescribed by your doctor, take them exactly as told. Too much can cause bleeding. Too little and you will not have the needed protection against stroke and other problems.  Perform blood tests at home if told by your doctor.  Perform blood tests exactly as told by your doctor.  Do not drink alcohol.  Do not drink beverages with caffeine such as coffee, soda, and some teas.  Maintain a healthy weight.  Do not use diet pills unless your doctor says they are safe. They may make heart problems worse.  Follow diet instructions as told by your doctor.  Exercise regularly as told by your doctor.  Keep all follow-up appointments. GET HELP IF:  You notice a change in the speed, rhythm, or strength of your heartbeat.  You suddenly begin peeing (urinating) more often.  You get tired more easily when moving or exercising. GET HELP RIGHT AWAY IF:   You have chest or belly (abdominal) pain.  You feel sick to your stomach  (nauseous).  You are short of breath.  You suddenly have swollen feet and ankles.  You feel dizzy.  You face, arms, or legs feel numb or weak.  There is a change in your vision or speech. MAKE SURE YOU:   Understand these instructions.  Will watch your condition.  Will get help right away if you are not doing well or get worse. Document Released: 05/31/2008 Document Revised: 01/06/2014 Document Reviewed: 10/02/2012 [facility] Patient Information 2015 [place], [place]. This information is not intended to replace advice given to you by your health care provider. Make sure you discuss any questions you have with your health care provider.

## 2014-07-15 NOTE — Progress Notes (Signed)
      Subjective: No chest pain or SOB  Objective: Vital signs in last 24 hours: Temp:  [97.6 F (36.4 C)-99 F (37.2 C)] 98.3 F (36.8 C) (11/10 1110) Pulse Rate:  [62-66] 66 (11/10 1110) Resp:  [18-20] 18 (11/10 1110) BP: (94-123)/(46-90) 116/57 mmHg (11/10 1110) SpO2:  [95 %-99 %] 98 % (11/10 1110) Weight:  [217 lb 9.5 oz (98.7 kg)] 217 lb 9.5 oz (98.7 kg) (11/09 2104) Weight change:  Last BM Date: 07/14/14 Intake/Output from previous day: -980 (-4426 since admit)  11/09 0701 - 11/10 0700 In: 753.4 [P.O.:720; I.V.:33.4] Out: 1751 [Urine:1750; Stool:1] Intake/Output this shift: Total I/O In: 480 [P.O.:480] Out: 500 [Urine:500]  PE: General: Well developed, well nourished, in no acute distress. Alert and oriented x 3.  Psych: Good affect, responds appropriately Neck: No JVD. No masses noted.  Lungs: Clear bilaterally with no wheezes or rhonci noted.  Heart: Regular with no murmurs noted. Abdomen: Bowel sounds are present. Soft, non-tender.  Extremities: No lower extremity edema.      Lab Results:  Recent Labs  07/13/14 0301  WBC 7.7  HGB 10.7*  HCT 32.6*  PLT 192   BMET  Recent Labs  07/13/14 0301  NA 139  K 4.2  CL 103  CO2 24  GLUCOSE 105*  BUN 15  CREATININE 1.17  CALCIUM 8.9   No results found for: HGBA1C   Lab Results  Component Value Date   TSH 8.400* 07/09/2014    Studies/Results: No results found.  Medications: I have reviewed the patient's current medications. Scheduled Meds: . amiodarone  400 mg Oral BID  . antiseptic oral rinse  7 mL Mouth Rinse BID  . apixaban  5 mg Oral BID  . clindamycin  450 mg Oral 4 times per day  . ferrous sulfate  325 mg Oral Q breakfast  . metoprolol tartrate  50 mg Oral BID  . sodium chloride  3 mL Intravenous Q12H  . tamsulosin  0.4 mg Oral Daily  . torsemide  5 mg Oral Daily  . vitamin B-12  1,000 mcg Oral Daily   Continuous Infusions:  PRN Meds:.tylenol  Assessment/Plan: Pt is a 68  yo man with pmh of afib/flutter, sick sinus syndrome with pacemaker, LV dysfunction, hyperlipidemia, chronic anemia, amiodarone-induced hypothyroidism, CKD Stage III, BPH, and GERD who was admitted on 11/3 for sepsis due to left LE cellulitis found to have sustained atrial fibrillation and [facility].   Plan:   1. Atrial Fibrillation s/p TEE with cardioversion on 11/7 - Currently in atrial paced rhythm with normal HR. Continue current meds. PO amiodarone 400 mg BID for 1 week then decrease to daily x 1 week before returning to home dose of ~100-200 mg daily. Pt on eliquis 5 mg BID for AC therapy due to CHADS2 score of 2 (age and LV dysfunction) .  2. Chronic diastolic CHF: EF 55 to 60%.Continue torsemide.   3. Sick Sinus Syndrome s/p implanted dual chamber pacemaker:  Pacemaker placed in 2009.   4. Amiodarone-induced Subclinical Hypothyroidism: plan in place per Dr. Herbie BaltimoreHarding for continued amiodarone with outpatient follow up of TSH level.    5. AKI on CKD, Stage 3:  Stable.

## 2014-07-15 NOTE — Progress Notes (Signed)
Physical Therapy Treatment Patient Details Name: Edward [name] MRN: [id number] DOB: 23-May-1946 Today'[name] Date: 07/15/2014    History of Present Illness Edward [name] is a 68 y.o. male presenting with SIRS and AMS . PMH is significant for sinoatrial node dysfunction with pacemaker, a fib, LV dysfunction, GERD, HTN.    PT Comments    Patient progressing well towards physical therapy goals, no limitations during this session as his heart rate maintained between 66-90 bpm. Ambulating without an assistive device up to 450 feet and safely completed stair training this AM. Feel he is adequate for d/c from a mobility standpoint, once medically ready.  Follow Up Recommendations  Home health PT;Supervision/Assistance - 24 hour     Equipment Recommendations  Rolling walker with 5" wheels;3in1 (PT)    Recommendations for Other Services OT consult     Precautions / Restrictions Precautions Precautions: Fall Precaution Comments: watch HR closely    Mobility  Bed Mobility                  Transfers Overall transfer level: Needs assistance Equipment used: None Transfers: Sit to/from Stand Sit to Stand: Supervision         General transfer comment: Supervision for safety. Performed from lowest bed setting x2 without physical assist. VC for hand placement with descent into chair.  Ambulation/Gait Ambulation/Gait assistance: Min guard Ambulation Distance (Feet): 450 Feet Assistive device: None Gait Pattern/deviations: Step-through pattern;Decreased stride length;Wide base of support     General Gait Details: Had pt perform dynamic gait challenges including high marching, backwards stepping, quick turns, and variable speeds. Pt declines practicing with a cane, as only minor sway is noted during gait at times but he is able to self correct without physical assistance. HR up to 90 BPM.   Stairs Stairs: Yes Stairs assistance: Min guard Stair Management: One rail  Right;Alternating pattern;Forwards Number of Stairs: 13 General stair comments: VC for pt to take his time, as he quickly ascends stairs. No loss of balance, and able to perform alternating step pattern. Does not require physical assist.  Wheelchair Mobility    Modified Rankin (Stroke Patients Only)       Balance                                    Cognition Arousal/Alertness: Awake/alert Behavior During Therapy: WFL for tasks assessed/performed Overall Cognitive Status: Within Functional Limits for tasks assessed                      Exercises General Exercises - Lower Extremity Ankle Circles/Pumps: AROM;Both;10 reps;Seated Long Arc Quad: Strengthening;Both;10 reps;Seated Hip Flexion/Marching: Strengthening;Both;15 reps;Seated    General Comments General comments (skin integrity, edema, etc.): HR 66-90 during therapy session      Pertinent Vitals/Pain Pain Assessment: No/denies pain    Home Living                      Prior Function            PT Goals (current goals can now be found in the care plan section) Acute Rehab PT Goals PT Goal Formulation: With patient Time For Goal Achievement: 07/27/14 Potential to Achieve Goals: Good Progress towards PT goals: Progressing toward goals    Frequency  Min 3X/week    PT Plan Current plan remains appropriate    Co-evaluation  End of Session Equipment Utilized During Treatment: Gait belt Activity Tolerance: Patient tolerated treatment well Patient left: in chair;with call bell/phone within reach     Time: 1108-1127 PT Time Calculation (min) (ACUTE ONLY): 19 min  Charges:  $Therapeutic Exercise: 8-22 mins                    G Codes:      Edward MountBarbour, Edward [name] 07/15/2014, 11:50 AM  Edward MerlesLogan Secor Jebediah [name], PT [phone]

## 2014-07-15 NOTE — Progress Notes (Signed)
Family Medicine Teaching Service Daily Progress Note Intern Pager: [phone]  Patient name: Edward [name] Medical record number: [id number] Date of birth: 12-10-1945 Age: 68 y.o. Gender: male  Primary Care Provider: No primary care provider on file.Dr. [facility] Consultants: none Code Status: FULL  Pt Overview and Major Events to Date:  07/08/14: patient admitted with sirs without clear source; got LP by IR that had neg gram stain; blood, urine, and CSF cx pending; started vanc/zosyn then zosyn switched to aztreonam due development of rash 07/09/14: restarted home a fib meds and started on eliquis for anticoag; LE dopplers done to rule out DVT and negative; TTE done showing normal EF 07/10/14: amiodarone increased; abx narrowed to po clinda to cover cellulitis and possible mrsa; he remained afebrile 11/6 TEE, cardioversion, NSR which returned to Afib later in the day; IV amio load 11/7- in and out of Afib with RVR 11/8 - amio drip 11/9 - a fib resolved, amio drip stopped  Assessment and Plan: Edward [name] is a 68 y.o. male presenting with sepsis and AMS . PMH is significant for sinoatrial node dysfunction with pacemaker, a fib, LV dysfunction, GERD, HTN, hypothyroidism.  # Sepsis: Resolved, source cellulitis. - initially vanc/aztreonam and transitioned to PO clinda (has been afebrile) - blood cxs no growth to date - CSF cx no growth to date - urine cx: negative  - TTE: EF of 55-60%  - monitor on tele  - incentive spirometry for atelectasis  # LE cellulitis: Resolved. - Day 7/7 of Tx of po clinda  - consider continuing 3 additional days  # A fib/flutter, SSS: s/p amio drip, a fib resolved (for past 36 hours) - In and out of NSR and Afib RVR during hospitalization, currently NSR - Amio drip discontinued 11/9 after resolution of a fib, now on po amio 400mg  BID  - On amio and metoprolol, decreased the metop to 50mg  BID due to soft pressures - cardioverted  after TEE 11/6 but flipped back to afib later in the afternoon - Eliquis for anticoagulation for stroke prevention given A fib - monitor on tele - Cardiology on board- appreciate recommendations - PPM in place, has appt OP for interrogation and hospital f/u with Paraschos on 11/12 at 11am  #Hypothyroidism, subclinical hypothyroidism: on synthroid in past.  - TSH 8.400 with normal free T3 and T4, likely sick euthyroid. Also consider toxicity from amiodarone. - recheck TSH in 2-3 months outpatient  FEN/GI: Heart healthy; IV saline lock  PPx: Eliquis as above  Disposition: tele floor; likely to home today with cards f/u 11/12  Subjective:  Patient doing well. No heart palpitations for last day and a half. No chest pain, SOB.   Objective: Temp:  [97.6 F (36.4 C)-99 F (37.2 C)] 99 F (37.2 C) (11/10 0621) Pulse Rate:  [62] 62 (11/10 0621) Resp:  [18-20] 18 (11/10 0621) BP: (94-130)/(46-90) 121/62 mmHg (11/10 0621) SpO2:  [95 %-100 %] 96 % (11/10 0621) Weight:  [217 lb 9.5 oz (98.7 kg)] 217 lb 9.5 oz (98.7 kg) (11/09 2104)  Physical Exam: Gen: NAD, alert, cooperative with exam, sitting up in bed HEENT: NCAT CV: regular rate and rhythm, no murmur Resp: CTABL, no wheezes, normal wob Abd: normal active BS, nontender, nondistended, no guarding or organomegaly Ext: left lower extremity with no erythema or swelling, approximately 2.5 cm circular bulla on his posterior left lower extremity Neuro: Alert and oriented, No gross deficits  Laboratory:  Recent Labs Lab 07/10/14 0250 07/11/14  0240 07/13/14 0301  WBC 9.2 7.6 7.7  HGB 10.9* 10.9* 10.7*  HCT 32.6* 33.2* 32.6*  PLT 133* 136* 192    Recent Labs Lab 07/08/14 1005 07/09/14 0339 07/10/14 0250 07/11/14 0240 07/13/14 0301  NA 140 138 139 138 139  K 4.2 4.1 4.1 3.9 4.2  CL 103 106 109 105 103  CO2 22 21 18* 20 24  BUN 18 24* 18 15 15   CREATININE 1.52* 1.50* 1.35 1.17 1.17  CALCIUM 8.5 7.8* 8.1* 8.4 8.9  PROT 6.5  5.4*  --   --   --   BILITOT 1.8* 1.8*  --   --   --   ALKPHOS 57 47  --   --   --   ALT 11 14  --   --   --   AST 20 23  --   --   --   GLUCOSE 105* 117* 93 103* 105*  TSH: 8.400 T3: 2.8 T4: 1.08  Urine cx: no growth to date  Blood cx: no growth to date x2  CSF:  Glucose: 63 Total protein: 55 RBC count: 1 WBC count: 2 Segmented neutro: none Appearance: clear Color: colorless  CSF gram stain: WBC PRESENT, PREDOMINANTLY MONONUCLEAR  NO ORGANISMS SEEN   CSF culture: no growth to date  Imaging/Diagnostic Tests: CXR (07/08/14): IMPRESSION: Hyperinflation consistent with reactive airway disease or COPD. There is no evidence of pneumonia, CHF, or other acute cardiopulmonary abnormality.  CXR (07/09/14): IMPRESSION: Superimposed on mild bilateral atelectasis, there is interval development of retrocardiac opacity. This is favored to represent atelectasis as well although the possibility of developing pneumonitis is not excluded.  Interval development of trace bilateral pleural effusions.  2D Echo:  Study Conclusions - Left ventricle: The cavity size was normal. Wall thickness was normal. Systolic function was normal. The estimated ejection fraction was in the range of 55% to 60%. Wall motion was normal; there were no regional wall motion abnormalities. - Pulmonary arteries: Systolic pressure was mildly increased. PA peak pressure: 31 mm Hg (S).  Edward [name], Med Student 07/15/2014, 8:01 AM [facility]  FPTS Intern pager: [phone], text pages welcome   I have seen and examined Edward [name] with Student Dr. Leda QuailStraub and I agree with her documentation above. My edits are in blue and my summary is below.   S:  Feeling well and ready to go home. No complaints.   O: Filed Vitals:   07/15/14 0621  BP: 121/62  Pulse: 62  Temp: 99 F (37.2 C)  Resp: 18   Gen: NAD, alert, cooperative with exam HEENT: NCAT CV: RRR, good S1/S2, no  murmur Resp: CTABL, no wheezes, non-labored Abd: SNTND, BS present, no guarding or organomegaly Ext: No edema, warm, LLE without erythema, 2-3 cm circular bulla on posterior medial leg with erythema surrounding it, no induration Neuro: Alert and oriented, No gross deficits  A/P: Edward [name] is a 68 y/o male admitted originally tdue to severe sepsis cellulitis who had an interval onset of Afib with RVR. He has been rate controlled with reasonable BPs for 24 hours now and is ready for DC.   We will dc on 3 additional days of PO clinda  We will recommend close follow up with hs cardiologist.   Murtis SinkSam Barnard Sharps, MD [facility] Resident, PGY-3 07/15/2014, 9:47 AM

## 2014-08-11 LAB — PROTIME-INR
INR: 1.5
Prothrombin Time: 17.8 secs — ABNORMAL HIGH (ref 11.5–14.7)

## 2014-08-12 ENCOUNTER — Inpatient Hospital Stay: Payer: Self-pay | Admitting: Internal Medicine

## 2014-08-12 LAB — URINALYSIS, COMPLETE
Bilirubin,UR: NEGATIVE
Glucose,UR: NEGATIVE mg/dL (ref 0–75)
Hyaline Cast: 11
Ketone: NEGATIVE
Nitrite: NEGATIVE
PH: 5 (ref 4.5–8.0)
RBC,UR: 18 /HPF (ref 0–5)
Specific Gravity: 1.024 (ref 1.003–1.030)
Squamous Epithelial: <1
WBC UR: 6 /HPF (ref 0–5)

## 2014-08-12 LAB — CBC WITH DIFFERENTIAL/PLATELET
BASOS ABS: 0.1 10*3/uL (ref 0.0–0.1)
Basophil #: 0 10*3/uL (ref 0.0–0.1)
Basophil %: 0.1 %
Basophil %: 0.5 %
EOS ABS: 0.1 10*3/uL (ref 0.0–0.7)
EOS PCT: 1.2 %
Eosinophil #: 0 10*3/uL (ref 0.0–0.7)
Eosinophil %: 0 %
HCT: 35.4 % — ABNORMAL LOW (ref 40.0–52.0)
HCT: 37.8 % — AB (ref 40.0–52.0)
HGB: 11.5 g/dL — ABNORMAL LOW (ref 13.0–18.0)
HGB: 12.4 g/dL — AB (ref 13.0–18.0)
LYMPHS ABS: 0.5 10*3/uL — AB (ref 1.0–3.6)
LYMPHS PCT: 3.1 %
Lymphocyte #: 0.9 10*3/uL — ABNORMAL LOW (ref 1.0–3.6)
Lymphocyte %: 8.8 %
MCH: 30 pg (ref 26.0–34.0)
MCH: 30.4 pg (ref 26.0–34.0)
MCHC: 32.5 g/dL (ref 32.0–36.0)
MCHC: 32.8 g/dL (ref 32.0–36.0)
MCV: 93 fL (ref 80–100)
MCV: 93 fL (ref 80–100)
MONO ABS: 0.7 x10 3/mm (ref 0.2–1.0)
MONOS PCT: 3.8 %
Monocyte #: 0.7 x10 3/mm (ref 0.2–1.0)
Monocyte %: 6.5 %
NEUTROS ABS: 16.3 10*3/uL — AB (ref 1.4–6.5)
NEUTROS ABS: 8.6 10*3/uL — AB (ref 1.4–6.5)
NEUTROS PCT: 93 %
Neutrophil %: 83 %
PLATELETS: 151 10*3/uL (ref 150–440)
Platelet: 144 10*3/uL — ABNORMAL LOW (ref 150–440)
RBC: 3.82 10*6/uL — ABNORMAL LOW (ref 4.40–5.90)
RBC: 4.07 10*6/uL — ABNORMAL LOW (ref 4.40–5.90)
RDW: 14.5 % (ref 11.5–14.5)
RDW: 14.6 % — ABNORMAL HIGH (ref 11.5–14.5)
WBC: 17.5 10*3/uL — ABNORMAL HIGH (ref 3.8–10.6)
WBC: 10.4 10*3/uL (ref 3.8–10.6)

## 2014-08-12 LAB — COMPREHENSIVE METABOLIC PANEL
ALK PHOS: 62 U/L
ALT: 14 U/L
ANION GAP: 11 (ref 7–16)
Albumin: 3 g/dL — ABNORMAL LOW (ref 3.4–5.0)
BUN: 20 mg/dL — ABNORMAL HIGH (ref 7–18)
Bilirubin,Total: 1 mg/dL (ref 0.2–1.0)
CALCIUM: 8.7 mg/dL (ref 8.5–10.1)
CREATININE: 1.77 mg/dL — AB (ref 0.60–1.30)
Chloride: 105 mmol/L (ref 98–107)
Co2: 24 mmol/L (ref 21–32)
EGFR (African American): 49 — ABNORMAL LOW
GFR CALC NON AF AMER: 41 — AB
Glucose: 169 mg/dL — ABNORMAL HIGH (ref 65–99)
OSMOLALITY: 286 (ref 275–301)
Potassium: 3.8 mmol/L (ref 3.5–5.1)
SGOT(AST): 14 U/L — ABNORMAL LOW (ref 15–37)
Sodium: 140 mmol/L (ref 136–145)
Total Protein: 6.2 g/dL — ABNORMAL LOW (ref 6.4–8.2)

## 2014-08-12 LAB — APTT: ACTIVATED PTT: 39.5 s — AB (ref 23.6–35.9)

## 2014-08-12 LAB — MAGNESIUM: MAGNESIUM: 2 mg/dL

## 2014-08-12 LAB — PHOSPHORUS: Phosphorus: 1.7 mg/dL — ABNORMAL LOW (ref 2.5–4.9)

## 2014-08-13 LAB — TSH: Thyroid Stimulating Horm: 6.96 u[IU]/mL — ABNORMAL HIGH

## 2014-08-13 LAB — BASIC METABOLIC PANEL
Anion Gap: 5 — ABNORMAL LOW (ref 7–16)
BUN: 16 mg/dL (ref 7–18)
Calcium, Total: 7.8 mg/dL — ABNORMAL LOW (ref 8.5–10.1)
Chloride: 111 mmol/L — ABNORMAL HIGH (ref 98–107)
Co2: 26 mmol/L (ref 21–32)
Creatinine: 1.57 mg/dL — ABNORMAL HIGH (ref 0.60–1.30)
EGFR (African American): 57 — ABNORMAL LOW
EGFR (Non-African Amer.): 47 — ABNORMAL LOW
Glucose: 92 mg/dL (ref 65–99)
Osmolality: 284 (ref 275–301)
Potassium: 4.1 mmol/L (ref 3.5–5.1)
Sodium: 142 mmol/L (ref 136–145)

## 2014-08-13 LAB — URINE CULTURE

## 2014-08-13 LAB — CBC WITH DIFFERENTIAL/PLATELET
Basophil #: 0 10*3/uL (ref 0.0–0.1)
Basophil %: 0.7 %
Eosinophil #: 0.2 10*3/uL (ref 0.0–0.7)
Eosinophil %: 3 %
HCT: 34.4 % — ABNORMAL LOW (ref 40.0–52.0)
HGB: 11.3 g/dL — ABNORMAL LOW (ref 13.0–18.0)
Lymphocyte #: 0.6 10*3/uL — ABNORMAL LOW (ref 1.0–3.6)
Lymphocyte %: 9.1 %
MCH: 30.4 pg (ref 26.0–34.0)
MCHC: 32.9 g/dL (ref 32.0–36.0)
MCV: 92 fL (ref 80–100)
Monocyte #: 0.4 x10 3/mm (ref 0.2–1.0)
Monocyte %: 5.8 %
Neutrophil #: 5.7 10*3/uL (ref 1.4–6.5)
Neutrophil %: 81.4 %
Platelet: 133 10*3/uL — ABNORMAL LOW (ref 150–440)
RBC: 3.73 10*6/uL — ABNORMAL LOW (ref 4.40–5.90)
RDW: 14.4 % (ref 11.5–14.5)
WBC: 7 10*3/uL (ref 3.8–10.6)

## 2014-08-13 LAB — HEMOGLOBIN A1C: Hemoglobin A1C: 5.2 % (ref 4.2–6.3)

## 2014-08-14 LAB — CBC WITH DIFFERENTIAL/PLATELET
BASOS ABS: 0.1 10*3/uL (ref 0.0–0.1)
Basophil %: 1 %
EOS ABS: 0.4 10*3/uL (ref 0.0–0.7)
EOS PCT: 7.1 %
HCT: 35.1 % — ABNORMAL LOW (ref 40.0–52.0)
HGB: 11.5 g/dL — ABNORMAL LOW (ref 13.0–18.0)
Lymphocyte #: 0.7 10*3/uL — ABNORMAL LOW (ref 1.0–3.6)
Lymphocyte %: 12.7 %
MCH: 30.3 pg (ref 26.0–34.0)
MCHC: 32.9 g/dL (ref 32.0–36.0)
MCV: 92 fL (ref 80–100)
MONOS PCT: 5.6 %
Monocyte #: 0.3 x10 3/mm (ref 0.2–1.0)
Neutrophil #: 4.2 10*3/uL (ref 1.4–6.5)
Neutrophil %: 73.6 %
Platelet: 150 10*3/uL (ref 150–440)
RBC: 3.81 10*6/uL — ABNORMAL LOW (ref 4.40–5.90)
RDW: 14.1 % (ref 11.5–14.5)
WBC: 5.7 10*3/uL (ref 3.8–10.6)

## 2014-08-14 LAB — BASIC METABOLIC PANEL
ANION GAP: 7 (ref 7–16)
BUN: 13 mg/dL (ref 7–18)
CHLORIDE: 111 mmol/L — AB (ref 98–107)
CO2: 25 mmol/L (ref 21–32)
CREATININE: 1.46 mg/dL — AB (ref 0.60–1.30)
Calcium, Total: 8.2 mg/dL — ABNORMAL LOW (ref 8.5–10.1)
EGFR (African American): >60
EGFR (Non-African Amer.): 51 — ABNORMAL LOW
GLUCOSE: 90 mg/dL (ref 65–99)
OSMOLALITY: 285 (ref 275–301)
Potassium: 3.9 mmol/L (ref 3.5–5.1)
Sodium: 143 mmol/L (ref 136–145)

## 2014-08-16 LAB — CULTURE, BLOOD (SINGLE)

## 2014-10-29 DIAGNOSIS — I519 Heart disease, unspecified: Secondary | ICD-10-CM | POA: Diagnosis not present

## 2014-10-29 DIAGNOSIS — I5032 Chronic diastolic (congestive) heart failure: Secondary | ICD-10-CM | POA: Diagnosis not present

## 2014-10-29 DIAGNOSIS — I4891 Unspecified atrial fibrillation: Secondary | ICD-10-CM | POA: Diagnosis not present

## 2014-10-29 DIAGNOSIS — I10 Essential (primary) hypertension: Secondary | ICD-10-CM | POA: Diagnosis not present

## 2014-12-13 ENCOUNTER — Inpatient Hospital Stay
Admit: 2014-12-13 | Disposition: A | Discharge status: Home / Self Care | Payer: Self-pay | Attending: Internal Medicine | Admitting: Internal Medicine

## 2014-12-13 DIAGNOSIS — K641 Second degree hemorrhoids: Secondary | ICD-10-CM | POA: Diagnosis not present

## 2014-12-13 DIAGNOSIS — I4891 Unspecified atrial fibrillation: Secondary | ICD-10-CM | POA: Diagnosis not present

## 2014-12-13 DIAGNOSIS — I10 Essential (primary) hypertension: Secondary | ICD-10-CM | POA: Diagnosis not present

## 2014-12-13 DIAGNOSIS — K644 Residual hemorrhoidal skin tags: Secondary | ICD-10-CM | POA: Diagnosis not present

## 2014-12-13 DIAGNOSIS — I2699 Other pulmonary embolism without acute cor pulmonale: Secondary | ICD-10-CM | POA: Diagnosis not present

## 2014-12-13 DIAGNOSIS — R600 Localized edema: Secondary | ICD-10-CM | POA: Diagnosis not present

## 2014-12-13 DIAGNOSIS — Z950 Presence of cardiac pacemaker: Secondary | ICD-10-CM | POA: Diagnosis not present

## 2014-12-13 DIAGNOSIS — Z880 Allergy status to penicillin: Secondary | ICD-10-CM | POA: Diagnosis not present

## 2014-12-13 DIAGNOSIS — N189 Chronic kidney disease, unspecified: Secondary | ICD-10-CM | POA: Diagnosis not present

## 2014-12-13 DIAGNOSIS — K219 Gastro-esophageal reflux disease without esophagitis: Secondary | ICD-10-CM | POA: Diagnosis not present

## 2014-12-13 DIAGNOSIS — E875 Hyperkalemia: Secondary | ICD-10-CM | POA: Diagnosis not present

## 2014-12-13 DIAGNOSIS — R0602 Shortness of breath: Secondary | ICD-10-CM | POA: Diagnosis not present

## 2014-12-13 DIAGNOSIS — R079 Chest pain, unspecified: Secondary | ICD-10-CM | POA: Diagnosis not present

## 2014-12-13 DIAGNOSIS — I482 Chronic atrial fibrillation: Secondary | ICD-10-CM | POA: Diagnosis not present

## 2014-12-13 DIAGNOSIS — D649 Anemia, unspecified: Secondary | ICD-10-CM | POA: Diagnosis not present

## 2014-12-13 DIAGNOSIS — N183 Chronic kidney disease, stage 3 (moderate): Secondary | ICD-10-CM | POA: Diagnosis not present

## 2014-12-13 DIAGNOSIS — K649 Unspecified hemorrhoids: Secondary | ICD-10-CM | POA: Diagnosis not present

## 2014-12-13 DIAGNOSIS — N400 Enlarged prostate without lower urinary tract symptoms: Secondary | ICD-10-CM | POA: Diagnosis not present

## 2014-12-13 DIAGNOSIS — R0781 Pleurodynia: Secondary | ICD-10-CM | POA: Diagnosis not present

## 2014-12-13 LAB — DIFFERENTIAL
Basophil #: 0 10*3/uL (ref 0.0–0.1)
Basophil %: 0.6 %
EOS ABS: 0 10*3/uL (ref 0.0–0.7)
Eosinophil %: 0.7 %
Lymphocyte #: 1 10*3/uL (ref 1.0–3.6)
Lymphocyte %: 15.2 %
MONO ABS: 0.5 x10 3/mm (ref 0.2–1.0)
Monocyte %: 7.2 %
NEUTROS PCT: 76.3 %
Neutrophil #: 5.1 10*3/uL (ref 1.4–6.5)

## 2014-12-13 LAB — BASIC METABOLIC PANEL
ANION GAP: 7 (ref 7–16)
Anion Gap: 8 (ref 7–16)
BUN: 16 mg/dL
BUN: 14 mg/dL
CHLORIDE: 106 mmol/L
CO2: 29 mmol/L
Calcium, Total: 8.8 mg/dL — ABNORMAL LOW
Calcium, Total: 8.1 mg/dL — ABNORMAL LOW
Chloride: 104 mmol/L
Co2: 27 mmol/L
Creatinine: 1.47 mg/dL — ABNORMAL HIGH
Creatinine: 1.21 mg/dL
EGFR (Non-African Amer.): >60
GFR CALC AF AMER: 55 — AB
GFR CALC NON AF AMER: 48 — AB
Glucose: 95 mg/dL
Glucose: 78 mg/dL
POTASSIUM: 3.4 mmol/L — AB
Potassium: 3.4 mmol/L — ABNORMAL LOW
SODIUM: 141 mmol/L
Sodium: 140 mmol/L

## 2014-12-13 LAB — CBC WITH DIFFERENTIAL/PLATELET
BASOS PCT: 0.6 %
Basophil #: 0 10*3/uL (ref 0.0–0.1)
Eosinophil #: 0 10*3/uL (ref 0.0–0.7)
Eosinophil %: 0.7 %
HCT: 34.8 % — AB (ref 40.0–52.0)
HGB: 12 g/dL — AB (ref 13.0–18.0)
LYMPHS PCT: 18.7 %
Lymphocyte #: 1.2 10*3/uL (ref 1.0–3.6)
MCH: 31 pg (ref 26.0–34.0)
MCHC: 34.5 g/dL (ref 32.0–36.0)
MCV: 90 fL (ref 80–100)
Monocyte #: 0.4 x10 3/mm (ref 0.2–1.0)
Monocyte %: 7 %
NEUTROS ABS: 4.6 10*3/uL (ref 1.4–6.5)
Neutrophil %: 73 %
Platelet: 185 10*3/uL (ref 150–440)
RBC: 3.86 10*6/uL — ABNORMAL LOW (ref 4.40–5.90)
RDW: 13.1 % (ref 11.5–14.5)
WBC: 6.3 10*3/uL (ref 3.8–10.6)

## 2014-12-13 LAB — CBC
HCT: 37.2 % — ABNORMAL LOW (ref 40.0–52.0)
HGB: 12.4 g/dL — AB (ref 13.0–18.0)
MCH: 30.7 pg (ref 26.0–34.0)
MCHC: 33.4 g/dL (ref 32.0–36.0)
MCV: 92 fL (ref 80–100)
Platelet: 194 10*3/uL (ref 150–440)
RBC: 4.05 10*6/uL — AB (ref 4.40–5.90)
RDW: 12.8 % (ref 11.5–14.5)
WBC: 6.7 10*3/uL (ref 3.8–10.6)

## 2014-12-13 LAB — APTT: Activated PTT: 32.1 secs (ref 23.6–35.9)

## 2014-12-13 LAB — TROPONIN I

## 2014-12-13 LAB — PROTIME-INR
INR: 1.1
Prothrombin Time: 14.1 secs

## 2014-12-13 LAB — D-DIMER(ARMC): D-Dimer: 1166 ng/ml

## 2014-12-14 LAB — BASIC METABOLIC PANEL
Anion Gap: 5 — ABNORMAL LOW (ref 7–16)
BUN: 15 mg/dL
CHLORIDE: 106 mmol/L
Calcium, Total: 8.1 mg/dL — ABNORMAL LOW
Co2: 28 mmol/L
Creatinine: 1.37 mg/dL — ABNORMAL HIGH
EGFR (African American): >60
EGFR (Non-African Amer.): 52 — ABNORMAL LOW
Glucose: 92 mg/dL
POTASSIUM: 3.4 mmol/L — AB
Sodium: 139 mmol/L

## 2014-12-14 LAB — HEPARIN LEVEL (UNFRACTIONATED): ANTI-XA(UNFRACTIONATED): 0.68 [IU]/mL (ref 0.30–0.70)

## 2014-12-14 LAB — CBC WITH DIFFERENTIAL/PLATELET
BASOS PCT: 0.7 %
Basophil #: 0 10*3/uL (ref 0.0–0.1)
Eosinophil #: 0.1 10*3/uL (ref 0.0–0.7)
Eosinophil %: 1.1 %
HCT: 34.3 % — AB (ref 40.0–52.0)
HGB: 11.7 g/dL — AB (ref 13.0–18.0)
LYMPHS PCT: 20.9 %
Lymphocyte #: 1.3 10*3/uL (ref 1.0–3.6)
MCH: 30.9 pg (ref 26.0–34.0)
MCHC: 34 g/dL (ref 32.0–36.0)
MCV: 91 fL (ref 80–100)
MONOS PCT: 7.2 %
Monocyte #: 0.5 x10 3/mm (ref 0.2–1.0)
NEUTROS ABS: 4.5 10*3/uL (ref 1.4–6.5)
Neutrophil %: 70.1 %
Platelet: 187 10*3/uL (ref 150–440)
RBC: 3.78 10*6/uL — ABNORMAL LOW (ref 4.40–5.90)
RDW: 13.3 % (ref 11.5–14.5)
WBC: 6.4 10*3/uL (ref 3.8–10.6)

## 2014-12-15 LAB — BASIC METABOLIC PANEL
Anion Gap: 6 — ABNORMAL LOW (ref 7–16)
BUN: 14 mg/dL
CREATININE: 1.27 mg/dL — AB
Calcium, Total: 8.9 mg/dL
Chloride: 106 mmol/L
Co2: 27 mmol/L
EGFR (African American): >60
GFR CALC NON AF AMER: 57 — AB
GLUCOSE: 104 mg/dL — AB
Potassium: 3.8 mmol/L
Sodium: 139 mmol/L

## 2014-12-15 LAB — CBC WITH DIFFERENTIAL/PLATELET
BASOS PCT: 0.7 %
Basophil #: 0.1 10*3/uL (ref 0.0–0.1)
Eosinophil #: 0.1 10*3/uL (ref 0.0–0.7)
Eosinophil %: 1.7 %
HCT: 36.8 % — AB (ref 40.0–52.0)
HGB: 12.5 g/dL — AB (ref 13.0–18.0)
LYMPHS ABS: 1.4 10*3/uL (ref 1.0–3.6)
LYMPHS PCT: 20 %
MCH: 30.7 pg (ref 26.0–34.0)
MCHC: 33.9 g/dL (ref 32.0–36.0)
MCV: 91 fL (ref 80–100)
Monocyte #: 0.5 x10 3/mm (ref 0.2–1.0)
Monocyte %: 6.7 %
NEUTROS ABS: 5.1 10*3/uL (ref 1.4–6.5)
Neutrophil %: 70.9 %
Platelet: 230 10*3/uL (ref 150–440)
RBC: 4.06 10*6/uL — ABNORMAL LOW (ref 4.40–5.90)
RDW: 12.9 % (ref 11.5–14.5)
WBC: 7.2 10*3/uL (ref 3.8–10.6)

## 2014-12-15 LAB — APTT
Activated PTT: 124.2 secs — ABNORMAL HIGH (ref 23.6–35.9)
Activated PTT: 94.9 secs — ABNORMAL HIGH (ref 23.6–35.9)

## 2014-12-15 LAB — HEPARIN LEVEL (UNFRACTIONATED): Anti-Xa(Unfractionated): >1.1 IU/mL (ref 0.30–0.70)

## 2014-12-16 LAB — CBC WITH DIFFERENTIAL/PLATELET
Basophil #: 0.1 10*3/uL (ref 0.0–0.1)
Basophil %: 0.7 %
Eosinophil #: 0.2 10*3/uL (ref 0.0–0.7)
Eosinophil %: 2 %
HCT: 36.7 % — ABNORMAL LOW (ref 40.0–52.0)
HGB: 12.4 g/dL — ABNORMAL LOW (ref 13.0–18.0)
LYMPHS ABS: 1.4 10*3/uL (ref 1.0–3.6)
Lymphocyte %: 18.5 %
MCH: 30.6 pg (ref 26.0–34.0)
MCHC: 33.7 g/dL (ref 32.0–36.0)
MCV: 91 fL (ref 80–100)
MONO ABS: 0.5 x10 3/mm (ref 0.2–1.0)
Monocyte %: 7 %
NEUTROS PCT: 71.8 %
Neutrophil #: 5.5 10*3/uL (ref 1.4–6.5)
PLATELETS: 239 10*3/uL (ref 150–440)
RBC: 4.05 10*6/uL — ABNORMAL LOW (ref 4.40–5.90)
RDW: 13.2 % (ref 11.5–14.5)
WBC: 7.6 10*3/uL (ref 3.8–10.6)

## 2014-12-16 LAB — HEMOGLOBIN: HGB: 12.4 g/dL — ABNORMAL LOW (ref 13.0–18.0)

## 2014-12-16 LAB — HEPARIN LEVEL (UNFRACTIONATED): ANTI-XA(UNFRACTIONATED): 1.08 [IU]/mL — AB (ref 0.30–0.70)

## 2014-12-16 LAB — APTT: Activated PTT: 99.5 secs — ABNORMAL HIGH (ref 23.6–35.9)

## 2014-12-19 ENCOUNTER — Other Ambulatory Visit: Payer: Self-pay | Admitting: *Deleted

## 2014-12-19 DIAGNOSIS — Z7901 Long term (current) use of anticoagulants: Secondary | ICD-10-CM | POA: Diagnosis not present

## 2014-12-19 DIAGNOSIS — I10 Essential (primary) hypertension: Secondary | ICD-10-CM | POA: Diagnosis not present

## 2014-12-19 DIAGNOSIS — I2699 Other pulmonary embolism without acute cor pulmonale: Secondary | ICD-10-CM | POA: Diagnosis not present

## 2014-12-19 DIAGNOSIS — I495 Sick sinus syndrome: Secondary | ICD-10-CM | POA: Diagnosis not present

## 2014-12-19 DIAGNOSIS — I4891 Unspecified atrial fibrillation: Secondary | ICD-10-CM | POA: Diagnosis not present

## 2014-12-19 DIAGNOSIS — D649 Anemia, unspecified: Secondary | ICD-10-CM | POA: Diagnosis not present

## 2014-12-19 DIAGNOSIS — Z950 Presence of cardiac pacemaker: Secondary | ICD-10-CM | POA: Diagnosis not present

## 2014-12-19 NOTE — Patient Outreach (Signed)
[facility]) Care Management  12/19/2014  Edward [name] 1946/07/19 [id number]    Phone call to patient to discuss his housing needs.  Per patient is being evicted from his home.  He has requested an extension and now has until 02/02/15 to be moved. Patient states that he is unaware of the reason for the eviction.  Per patient he has contacted the [employer] and [employer].  Home visit scheduled for 12/22/14 at 9:00am   Lucienne Sawyers Lonn GeorgiaLand, LCSW [facility] Care Management [phone]

## 2014-12-22 ENCOUNTER — Other Ambulatory Visit: Payer: Self-pay | Admitting: *Deleted

## 2014-12-22 NOTE — Patient Outreach (Signed)
[facility]) Care Management  [facility] Social Work  12/22/2014  Edward [name] 07-03-1946 [id number]   Current Medications:  Current Outpatient Prescriptions  Medication Sig Dispense Refill  . amiodarone (PACERONE) 200 MG tablet Take 0.5 tablets (100 mg total) by mouth 2 (two) times daily. Begin after you stop the 400 mg pill 30 tablet 3  . apixaban (ELIQUIS) 5 MG TABS tablet Take 1 tablet (5 mg total) by mouth 2 (two) times daily. 60 tablet 3  . KLOR-CON M10 10 MEQ tablet Take 10 mEq by mouth daily.     . metoprolol (LOPRESSOR) 50 MG tablet Take 1 tablet (50 mg total) by mouth 2 (two) times daily. 60 tablet 3  . amiodarone (PACERONE) 400 MG tablet Take 1 tablet twice daily for 1 week and then 1 tablet daily for 1 week 21 tablet 0  . ferrous sulfate 325 (65 FE) MG tablet Take 1 tablet (325 mg total) by mouth daily with breakfast. 30 tablet 3  . finasteride (PROSCAR) 5 MG tablet Take 5 mg by mouth every evening.     . tamsulosin (FLOMAX) 0.4 MG CAPS capsule Take 0.4 mg by mouth daily.     Marland Kitchen torsemide (DEMADEX) 5 MG tablet Take 1 tablet (5 mg total) by mouth daily. 30 tablet 2  . vitamin B-12 (CYANOCOBALAMIN) 1000 MCG tablet Take 1,000 mcg by mouth daily.     No current facility-administered medications for this visit.    Functional Status:  In your present state of health, do you have any difficulty performing the following activities: 12/22/2014 07/08/2014  Hearing? N N  Vision? N N  Difficulty concentrating or making decisions? N Y  Walking or climbing stairs? N N  Dressing or bathing? N N  Doing errands, shopping? N N  Preparing Food and eating ? N -  Using the Toilet? N -  In the past six months, have you accidently leaked urine? Y -  Do you have problems with loss of bowel control? N -  Managing your Medications? N -  Managing your Finances? N -  Housekeeping or managing your Housekeeping? N -    Fall/Depression Screening:  PHQ 2/9 Scores 12/22/2014  PHQ  - 2 Score 0    Assessment:    This Education officer, museum met with patient and his wife to discuss options for new living arrangements.  Per patient, they are  being evicted from their apartment and will have to be moved by 02/02/15.  Per patient, he is unsure of the reason for the eviction, however he has begun the search for a new place.  Per patient, he has gone to [employer] and has picked up applications for senior and low income housing to complete.  He has also been working from a list he has obtained of area apartment complexes.  This social also completed a housing search through social serve.  Phone calls were made regarding availabilities as well, messages left for a return call.  Patient states that he and his wife have toured  [facility], however has  declined this option stating that  the rooms were too small.   Plan:  Patient to complete application for senior housing and low income housing. This Education officer, museum will provide patient with additional housing lists and will follow up with patient next week.     [place]        Patient Outreach from 12/22/2014 in [place]  Problem One  Housing   Care Plan for Problem One  Active   Interventions for Problem One Long Term Goal  re-location options explored with patient and his wife.   [facility] Long Term Goal (31-90 days)  Patient will have move into a new apartment within the next 90 days   [facility] Long Term Goal Start Date  12/22/14   [facility] CM Short Term Goal #1 (0-30 days)  social worker to provide patient with additional housing resources/options to explore   [facility] CM Short Term Goal #1 Start Date  12/22/14   [facility] CM Short Term Goal #2 (0-30 days)  patient to complete applications for senior and low income housing within 30 days   [facility] CM Short Term Goal #2 Start Date  12/22/14        Sheralyn Boatman [facility] Care Management [phone]

## 2014-12-23 NOTE — Consult Note (Signed)
Referring Physician:  Samson Frederic   Primary Care Physician:  [facility], [address], [place], [place] [address], [place] [phone]  Reason for Consult:  Admit Date: 21-Jul-2012   Chief Complaint: Syncope   Reason for Consult: Syncope   History of Present Illness:  History of Present Illness:   CHIEF COMPLAINT: Syncope.  OF PRESENT ILLNESS: 69 year old male with history of recurrent syncope, tachybrady syndrome status post pacemaker placement and atrial fibrillation/atrial flutter in 2011 presented with syncope.  Neurology is asked to evaluate for possible neurologic etiology. patient states that he was eating dinner when he became abruptly lightheaded, passed out, and fell forward with his face hitting the table.  He says this happened about 5 minutes before the actual syncope.  In addition to lightheaded he said he "just felt really wierd".  His family wife reports that the witnessed LOC lasted less than 10 minutes although there is some uncertainty about the precise duration.  There was some global four extremity twitching noted by the patient and the wife says she did not see this.  After the event he was back to normal and there was no confusion or residual deficit or confusion.  There was an episode similar to this about a year ago at which time he was diagnosed with dehydration.   MEDICAL HISTORY:  1. Recurrent syncope.  2. History of lower GI bleed due to hemorrhoids.  Benign prostatic hypertrophy.  History of atrial fibrillation and atrial flutter.   History of tachybrady syndrome.  Hypertension.  B12 deficiency.  Gastroesophageal reflux disease.  History of renal insufficiency as well as history of Klebsiella urinary tract infection.  SURGICAL HISTORY:  1. Pacemaker placement in 2009. 2. Hemorrhoidectomy.  HISTORY: Quit smoking [id number]. No alcohol or illicit drug use.  HISTORY: Brother with lung cancer.  Sister with cancer of unknown type.   Father had leukemia.   as listed below.    ALLERGIES: Penicillin causes a rash.  EXAMINATION:  GENERAL:man.  NAD.  Normocephalic and atraumatic. exam without pharmacologic dilation shows normal disc size, appearance and C/D ratio.  There is no papilledema. and S2 sounds are within normal limits, without murmurs, gallops, or rubs. - Normal- NormalDrift - Absent bilaterally.- Gait and station are within normal limits.- Absent    Shoulder abduction (deltoid/supraspinatus, axillary/suprascapular n, C5)   Elbow flexion (biceps brachii, musculoskeletal n, C5-6)   Elbow extension (triceps, radial n, C7)   Finger adduction (interossei, ulnar n, T1)    Hip flexion (iliopsoas, L1/L2)   Knee flexion (hamstrings, sciatic n, L5/S1)  5/5    Knee extension (quadriceps, femoral n, L3/4)   Ankle dorsiflexion (tibialis anterior, deep fibular n, L4/5)   Ankle plantarflexion (gastroc, tibial n, S1)  STATUS:is oriented to person, place and time.  Recent and remote memory are intact.  Attention span and concentration are intact.  Naming, repetition, comprehension and expressive speech are within normal limits.  Patient's fund of knowledge is within normal limits for educational level. NERVES:   CN II (normal visual acuity and visual fields)   CN III, IV, VI (extraocular muscles are intact)   CN V (facial sensation is intact bilaterally)   CN VII (facial strength is intact bilaterally)   CN VIII (hearing is intact bilaterally)   CN IX/X (palate elevates midline, normal phonation)   CN XI (shoulder shrug strength is normal and symmetric)   CN XII (tongue protrudes midline) to pain and temp bilaterally (spinothalamic tracts)to position  and vibration bilaterally (dorsal columns)    Biceps   Brachioradialis    Patellar   Achilles to nose testing is within normal limits shows atrial paced rhythm at 61 beats per minute.  AND PLAN: 69 year old male with history of recurrent syncope, tachybrady syndrome status post pacemaker  placement and atrial fibrillation/atrial flutter in 2011 presented with syncope.  Neurology is asked to evaluate for possible neurologic etiology. exam is within normal limits.  His syncope with rapid normalization after regaining consciousness seems to argue against seizure.  However, I think it would be appropriate to check a routine EEG to rule out ictal vs interictal activity given the prodrome of abnormal but nonspecific sensations lasting a few minutes before the event.  Given his history of multiple cardiac issues, arrhythmia seems very possible.  TTE could be considered tp evaluate for a structural heart defect.  We have discussed that his carotid ultrasound was normal.  Carotid U/S inadequately visualizes the posterior circulation such as the verts, however given pacemaker he cannot tolerate an MR.  Given lack of treatment beyond aspirin (which he is already taking) for posterior circulation ischemia, would not recommend CTA.  Given that the symptoms occurred before eating, hypoglycemia vs dehydration are possibilities as well.    Gurney Maxin, MD     ROS:   General denies complaints    HEENT no complaints    Lungs no complaints    Cardiac no complaints    GI no complaints    GU no complaints    Musculoskeletal no complaints    Extremities no complaints    Skin no complaints    Endocrine no complaints   Past Medical/Surgical Hx:  Prostate:   Hypertension:   GERD:   Atrial Fibrillation:   skin graft:   pacemaker:   hemorroidectomy:   Home Medications: Medication Instructions Last Modified Date/Time  tamsulosin 0.4 mg oral capsule 1 cap(s) orally once a day 16-Nov-13 10:09  Pacerone 200 mg oral tablet 2 tab(s) orally once a day 16-Nov-13 10:09  pantoprazole 40 mg oral delayed release tablet 1 tab(s) orally once a day 16-Nov-13 10:09  finasteride 5 mg oral tablet 1 tab(s) orally once a day 16-Nov-13 10:09  metoprolol tartrate 50 mg oral tablet 1 tab(s) orally 2  times a day 16-Nov-13 10:09  Analpram-HC 2.5%-1% rectal cream 1 applicatorful rectal 3 times a day 16-Nov-13 10:09  Vitamin D3 1000 intl units oral tablet 1 tab(s) orally once a day 16-Nov-13 10:09  Vitamin B12 2000 microgram(s) orally once a day with meals 16-Nov-13 10:09  aspirin 81 mg oral tablet 4 tab(s) orally once a day 16-Nov-13 10:09   Allergies:  PCN: Rash  Vital Signs: **Vital Signs.:   16-Nov-13 11:31   Vital Signs Type Routine   Temperature Temperature (F) 97.4   Celsius 36.3   Temperature Source oral   Pulse Pulse 62   Respirations Respirations 20   Systolic BP Systolic BP 883   Diastolic BP (mmHg) Diastolic BP (mmHg) 88   Mean BP 111   Pulse Ox % Pulse Ox % 100   Pulse Ox Activity Level  At rest   Oxygen Delivery Room Air/ 21 %   Lab Results:  Thyroid:  15-Nov-13 22:17    Thyroid Stimulating Hormone  12.5 (0.45-4.50 (International Unit)  ----------------------- Pregnant patients have  different reference  ranges for TSH:  - - - - - - - - - -  Pregnant, first trimetser:  0.36 - 2.50 uIU/mL)  Hepatic:  15-Nov-13 22:17    Bilirubin, Total  1.1   Bilirubin, Direct 0.2 (Result(s) reported on 21 Jul 2012 at 12:37AM.)   Alkaline Phosphatase 68   SGPT (ALT) 17   SGOT (AST) 19   Total Protein, Serum 7.2   Albumin, Serum 3.8  Routine Chem:  15-Nov-13 22:17    Glucose, Serum  133   BUN 8   Creatinine (comp)  1.38   Sodium, Serum 142   Potassium, Serum 3.6   Chloride, Serum 107   CO2, Serum 28   Calcium (Total), Serum  8.4   Anion Gap 7   Osmolality (calc) 283   eGFR (African American) >60   eGFR (Non-African American)  52 (eGFR values <46m/min/1.73 m2 may be an indication of chronic kidney disease (CKD). Calculated eGFR is useful in patients with stable renal function. The eGFR calculation will not be reliable in acutely ill patients when serum creatinine is changing rapidly. It is not useful in  patients on dialysis. The eGFR calculation may not be  applicable to patients at the low and high extremes of body sizes, pregnant women, and vegetarians.)  Cardiac:  15-Nov-13 22:17    CK, Total 157   CPK-MB, Serum 2.9 (Result(s) reported on 21 Jul 2012 at 12:27AM.)   Troponin I < 0.02 (0.00-0.05 0.05 ng/mL or less: NEGATIVE  Repeat testing in 3-6 hrs  if clinically indicated. >0.05 ng/mL: POTENTIAL  MYOCARDIAL INJURY. Repeat  testing in 3-6 hrs if  clinically indicated. NOTE: An increase or decrease  of 30% or more on serial  testing suggests a  clinically important change)  16-Nov-13 06:35    CK, Total 113   CPK-MB, Serum 1.9 (Result(s) reported on 21 Jul 2012 at 07:03AM.)   Troponin I < 0.02 (0.00-0.05 0.05 ng/mL or less: NEGATIVE  Repeat testing in 3-6 hrs  if clinically indicated. >0.05 ng/mL: POTENTIAL  MYOCARDIAL INJURY. Repeat  testing in 3-6 hrs if  clinically indicated. NOTE: An increase or decrease  of 30% or more on serial  testing suggests a  clinically important change)  Routine Hem:  15-Nov-13 22:17    WBC (CBC) 6.6   RBC (CBC)  4.03   Hemoglobin (CBC)  12.5   Hematocrit (CBC)  36.5   Platelet Count (CBC) 192 (Result(s) reported on 20 Jul 2012 at 10:31PM.)   MCV 91   MCH 30.9   MCHC 34.2   RDW 13.3   Radiology Results: [place]    16-Nov-13 10:42, [place] Doppler Bilateral   [place] Doppler Bilateral    REASON FOR EXAM:    syncope  COMMENTS:       PROCEDURE: [place] - [place] DOPPLER BILATERAL  - Jul 21 2012 10:42AM     RESULT: Comparison made to prior study of 12/08/2009. Mild bilateral   calcific plaque noted both carotid bifurcations. Peak systolic flow   velocity ratio on the right is 0.72, on the left 0.68. Vertebrals are   patent with antegrade flow.    IMPRESSION:  No flow-limiting stenosis.        Verified By: TOsa Craver M.D., MD   Electronic Signatures: PAnabel Bene(MD)  (Signed [phone])  Authored: REFERRING PHYSICIAN, Primary Care Physician, Consult,  History of Present Illness, Review of Systems, PAST MEDICAL/SURGICAL HISTORY, HOME MEDICATIONS, ALLERGIES, NURSING VITAL SIGNS, LAB RESULTS, RADIOLOGY RESULTS   Last Updated: 16-Nov-13 14:49 by PAnabel Bene(MD)

## 2014-12-23 NOTE — Consult Note (Signed)
PATIENT NAME:  Edward [name], Edward [name] MR#:  [id number] DATE OF BIRTH:  [birth year]  DATE OF CONSULTATION:  07/21/2012  REFERRING PHYSICIAN:   CONSULTING PHYSICIAN:  Marcina MillardAlexander Ran Tullis, MD  PRIMARY CARE PHYSICIAN:  None.  CHIEF COMPLAINT: " I passed out."   REASON FOR CONSULTATION: Consultation requested for evaluation of syncope.   HISTORY OF PRESENT ILLNESS: The patient is a 69 year old gentleman with known history of tachybrady syndrome status post dual-chamber pacemaker. The patient apparently was in his usual state of health until last evening when while eating dinner had an apparent episode of syncope. The wife notes that she walked in the room and his face was in his food. The wife lifted his head out of the food, and after approximately five minutes the patient was back to his normal state. EMTs arrived and the patient had no complaints, was talking and joking with those present. He was brought to [facility] Room where EKG revealed atrial pacing. The patient was admitted where he has had no recurrent syncope. The patient has ruled out for myocardial infarction by CPK isoenzymes and troponin. Pacemaker interrogation has demonstrated normal pacemaker function without evidence of atrial or ventricular arrhythmia.   PAST MEDICAL HISTORY:  1. Recurrent syncope.  2. Paroxysmal atrial fibrillation/flutter.  3. Sick sinus syndrome status post dual-chamber pacemaker. 4. Hypertension.   MEDICATIONS:  1. Metoprolol 25 mg b.i.d. 2. Amiodarone 200 mg daily.  3. Aspirin 81 mg daily.  4. Tamsulosin 0.4 mg daily.  5. Pantoprazole 40 mg daily.  6. Finasteride 5 mg daily.  7. Metoprolol tartrate 50 mg b.i.d.  8. Analpram-HC rectal cream t.i.d.   SOCIAL HISTORY: The patient is married, resides with his wife. He quit tobacco abuse in 1967. He is a retired Electrical engineersecurity guard.   FAMILY HISTORY: No immediate family history of coronary artery disease or myocardial infarction.      REVIEW OF SYSTEMS: CONSTITUTIONAL: No fever or chills. EYES: No blurry vision. EARS: No hearing loss. RESPIRATORY: No shortness of breath. CARDIOVASCULAR: No chest pain. The patient did have an syncopal episode last evening. GI: No nausea, vomiting, diarrhea, or constipation. GU: No dysuria or hematuria. ENDOCRINE: No polyuria or polydipsia. INTEGUMENT: No rash. MUSCULOSKELETAL: No arthralgias or myalgias.  NEURO: No focal muscle weakness or numbness. PSYCH: No anxiety or depression.   PHYSICAL EXAMINATION:  VITAL SIGNS: Blood pressure 129/75, pulse 60, respirations 18, temperature 97.6, pulse oximetry 99%.   HEENT: Pupils equal, reactive to light and accommodation.   NECK: Supple without thyromegaly.   LUNGS: Clear.   HEART: Normal jugular venous pressure. Normal point of maximal impulse. Regular rate and rhythm. Normal S1, S2. No appreciable gallop, murmur, or rub.   ABDOMEN: Soft and nontender. Pulses were intact bilaterally.   MUSCULOSKELETAL: Normal muscle tone.   NEUROLOGIC: The patient is alert and oriented times three. Motor and sensory both grossly intact.   IMPRESSION: This is a 69 year old gentleman status post dual-chamber pacemaker for sick sinus syndrome with history of paroxysmal atrial fibrillation/flutter who presents with syncopal episode with atypical features. The patient has ruled out for myocardial infarction by CPK isoenzymes and troponin. The patient has had no recurrent presyncope or syncope. Pacemaker interrogation shows properly functioning dual-chamber pacemaker without evidence of atrial or ventricular arrhythmia.   RECOMMENDATIONS:  1. Agree with current therapy.  2. Would defer further cardiac diagnostics at this time.  3. Continue current cardiac medications.   ____________________________ Marcina MillardAlexander Jenee Spaugh, MD ap:bjt D: 07/21/2012 15:11:08 ET T: 07/21/2012 15:50:33  ET JOB#: [id number]  cc: Marcina Millard, MD, <Dictator> Marcina Millard  MD ELECTRONICALLY SIGNED 08/17/2012 14:27

## 2014-12-23 NOTE — Consult Note (Signed)
Brief Consult Note: Diagnosis: Syncope, atypical. Normally functioning ppm, no evidence of atrial or ventricular arrythmia, neg troponin, no recurrent presyncope or syncope, ? neurocardiogenic.   Patient was seen by consultant.   Consult note dictated.   Comments: REC  Agree with current therapy, cont current cardiac meds, defer further cardiac w/u at this time.  Electronic Signatures: Marcina MillardParaschos, Eain Mullendore (MD)  (Signed [phone]-Nov-13 15:12)  Authored: Brief Consult Note   Last Updated: 16-Nov-13 15:12 by Marcina MillardParaschos, Tyshea Imel (MD)

## 2014-12-23 NOTE — H&P (Signed)
PATIENT NAME:  Edward [name], Edward [name] MR#:  [id number] DATE OF BIRTH:  06-13-1946  DATE OF ADMISSION:  07/21/2012  PRIMARY CARE PHYSICIAN: None.  CHIEF COMPLAINT: Syncope.   HISTORY OF PRESENT ILLNESS: 69 year old male with history of recurrent syncope, tachybrady syndrome status post pacemaker placement as well as history of atrial fibrillation/atrial flutter in 2011 presents with syncope finalized prior to admission.   Patient was having dinner when he suddenly felt lightheaded and "face planted at the dinner table". This was witnessed by his wife. She notes that he had some shaking and duration of loss of consciousness was about 5 to 10 minutes. Upon regaining consciousness patient recalls that he was diffusely diaphoretic but was aware of the situation and there was no postictal state. Of note, he had an episode similar to this about a year ago, presented and was treated for dehydration. He last saw his cardiologist on 11/05 and had his pacemaker interrogated at that time which reportedly was normal. He also had an echocardiogram done on the day prior to presentation, 11/15, as well. Of note, patient has history of recurrent syncope, was hospitalized in 2009 and 2011 with similar episodes. In 2009 he was found to have tachybrady syndrome and had pacemaker placed and in 2011 was noted to have atrial fibrillation as well as atrial flutter and was started on amiodarone. Patient notes that he has been compliant with all his medications. Patient denies chest pain, palpitations, dyspnea, dyspnea on exertion, dysuria, urgency, frequency, nausea, vomiting or diarrhea prior to this syncopal episode.  PAST MEDICAL HISTORY:  1. Recurrent syncope.  2. History of lower GI bleed due to hemorrhoids.  3. Benign prostatic hypertrophy.  4. History of atrial fibrillation and atrial flutter.   5. History of tachybrady syndrome.  6. Hypertension.  7. B12 deficiency.  8. Gastroesophageal reflux disease.  9. History of  renal insufficiency as well as history of Klebsiella urinary tract infection.   PAST SURGICAL HISTORY:  1. Pacemaker placement in 2009. 2. Hemorrhoidectomy.   MEDICATIONS: Medication reconciliation has not been done at this time. Patient does not have his medication list with him. He uses the [facility] on [address]. Records as of a 07/27/2011 indicate patient was on tamsulosin, Protonix, amiodarone 200 mg daily, metoprolol 25 mg twice a day, and finasteride. We will reconcile his medications fully in the morning.   ALLERGIES: Penicillin causes a rash.   SOCIAL HISTORY: Remote tobacco use, quit in 1967. Denies alcohol or illicit drug use. He has been married for 39 years. He lives with his wife. 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   FAMILY HISTORY: Notable for brother deceased from lung cancer. Sister also deceased from cancer two weeks ago, type unknown. Mother deceased from cancer, type unknown. Also had history of hypertension. Father deceased from acute leukemia.   REVIEW OF SYSTEMS: CONSTITUTIONAL: Denies fever, fatigue, weight loss, blurred vision, eye pain. Admits to some eye itching. ENT: Denies tinnitus, ear pain. RESPIRATORY: Denies cough, hemoptysis, dyspnea, painful respirations. CARDIOVASCULAR: Denies chest pain, palpitations. Has history of atrial fibrillation as well as history of syncope. He has mild chronic edema in bilateral ankles. GASTROINTESTINAL: Denies nausea, vomiting, diarrhea, abdominal pain. Admits to constipation. Patient denies melena or hematochezia. GENITOURINARY: Denies dysuria or frequency. ENDOCRINE: Denies increased sweating. INTEGUMENTARY: Denies any skin rashes. MUSCULOSKELETAL: Denies arthralgias or myalgias and or joint swelling. NEUROLOGIC: Denies numbness, headaches or paresthesias. Admits to lightheadedness. PSYCH: Denies anxiety  or depression.   PHYSICAL EXAMINATION:  VITAL SIGNS: Temperature 97.4, pulse  62, respirations 18, blood pressure 139/62, sating 100% on room air.   GENERAL: Elderly Caucasian male in no apparent distress.   PSYCH: Awake, alert, oriented x3. Judgment appears intact.   EYES: Pupils equal, round and reactive to light and accommodation. Anicteric sclerae. Pink conjunctivae. Normal lids.   ENT: Normal external ears and nares. Posterior oropharynx is clear without exudate or erythema.   NEUROLOGICAL: Cranial nerves II through XII are intact with facial symmetry. Equal palatal lift. Tongue is midline. There is equal shoulder shrug. Finger rub is audible to the patient. Pupils are equally round and reactive to light and accommodation. Extraocular muscles are intact. Facial sensation is intact. There is full strength in bilateral upper and lower extremity flexion as well as extension. Sensation is grossly intact. Finger-to-nose is intact.   CARDIOVASCULAR: S1, S2, regular rate and rhythm. No murmurs appreciated. There is no pretibial edema and there is trace pedal edema.   NECK: There is no thyromegaly. Neck is supple.   RESPIRATORY: Clear to auscultation bilaterally. No wheezes, rales, or rhonchi. Normal effort.   ABDOMEN: Soft, nontender, nondistended with normal bowel sounds. No hepatosplenomegaly appreciated.   SKIN: Warm and dry. No rashes or lesions noted.   LABORATORY, DIAGNOSTIC AND RADIOLOGICAL DATA: White count 6.6, hemoglobin 12.5, hematocrit 36.5 with platelet count of 192, MCV 91. Glucose 133, BUN 8, creatinine 1.38, sodium 142, potassium 3.6, chloride 107, bicarbonate 28, calcium 8.4, anion gap 7, osmolality 283.   Troponin is less than 0.02.   CK 157 with a CK-MB of 2.9, total bilirubin 1.1 with a direct bilirubin of 0.2, alkaline phosphatase 68, ALT 17 AST 19, total protein 7.2, albumin 3.8.   EKG shows atrial paced rhythm at 61 beats per minute.   ASSESSMENT AND PLAN: 69 year old male with recurrent syncope presenting with a syncopal episode.   1. Syncope concerning for arrhythmia given acuity. Will consult Dr. Darrold JunkerParaschos for pacemaker interrogation. Will place patient on telemetry. Will obtain records from Dr. Darrold JunkerParaschos for echocardiogram performed November 15, which is the day prior to admission rather than repeating another study. Will check carotid ultrasound. Although I doubt vascular process is the underlying cause. Will check orthostatics, TSH and monitor on telemetry. 2. History of atrial fibrillation. Will continue amiodarone at 200 mg daily and reconcile medications and adjust dosing if needed. Continue metoprolol. Start aspirin.  3. Benign prostatic hypertrophy. Hold medications until orthostatic hypotension ruled out. This can subsequently be initiated on discharge.  4. Gastroesophageal reflux disease.  5. Hypertension. Metoprolol for now. Will resume any other outpatient medications once med reconciliation is completed.  6. B12 deficiency.  7. Constipation. Patient reported constipation. Will start him on a bowel regimen.  8. CKD III. Stable. 9. Prophylaxis. Lovenox.  10. CODE STATUS: FULL CODE. Surrogate decision maker is his wife, Carollee MassedBetty Raneri. 11. Disposition. Patient is being admitted to observation status for evaluation of syncope.    TIME SPENT COORDINATING ADMISSION: 50 minutes.   ____________________________ Aurther LoftAdaorah E. Krystie Leiter, DO aeo:cms D: 07/21/2012 03:17:53 ET T: 07/21/2012 08:48:28 ET JOB#: [id number]  cc: Aurther LoftAdaorah E. Savier Trickett, DO, <Dictator> Marcina MillardAlexander Paraschos, MD  Geoffrey Mankin E Eyla Tallon DO ELECTRONICALLY SIGNED 08/01/2012 3:17

## 2014-12-23 NOTE — Discharge Summary (Signed)
PATIENT NAME:  Edward [name], Edward [name] MR#:  [id number] DATE OF BIRTH:  07/21/1946  DATE OF ADMISSION:  07/21/2012 DATE OF DISCHARGE:  07/21/2012  DIAGNOSES:  1. Recurrent syncope.  2. History of lower GI bleed due to hemorrhoids.  3. Benign prostatic hypertrophy.  4. Atrial fibrillation/atrial flutter, tachybrady syndrome status post pacemaker placement, not on any anticoagulation. 5. Hypertension.  6. Gastroesophageal reflux disease. 7. B12 deficiency.  8. Chronic kidney disease.  9. Elevated TSH.   DISPOSITION: The patient is being discharged home.   FOLLOWUP: Follow up with Dr. Darrold JunkerParaschos and primary care physician 1 to 2 weeks after discharge.   DIET: Low sodium.   ACTIVITY: As tolerated.   DISCHARGE MEDICATIONS:  1. Flomax 0.4 mg daily.  2. Amiodarone 200 mg, 2 tablets once a day.  3. Protonix 40 mg daily.  4. Finasteride 5 mg daily.  5. Lopressor 50 mg b.i.d.  6. Analpram one application to rectal area 3 times a day.  7. Vitamin D3 1000 international units once a day.  8. Vitamin B12 2000 mcg once a day.  9. Aspirin 81 mg, 4 tablets once a day.   CONSULTATIONS:  1. Neurology consultation with Dr. Malvin JohnsPotter.  2. Cardiology consultation with Dr. Darrold JunkerParaschos.   RESULTS: White count 6.6, hemoglobin 12.5, platelet count 192. Glucose 133, BUN 8, creatinine 1.38, sodium 142, potassium 2.7. Cardiac enzymes negative. TSH 12.5.   HOSPITAL COURSE: The patient is a 69 year old male with history of atrial fibrillation/atrial flutter, tachybrady syndrome status post pacemaker who reported that he had a recent evaluation by Dr. Darrold JunkerParaschos including echo and pacemaker interrogation on 11/15. When he went home after the test he was sitting at his dinner table to eat and he passed out. He had a similar episode in 2009 and 2011. One of those episodes was felt to be due to tachybrady syndrome and the patient had subsequently undergone a pacemaker placement. The second episode was felt to be due to  dehydration. The patient was admitted to the hospital for observation and acute coronary syndrome was ruled out by negative serial cardiac enzymes. Carotid ultrasound showed no stenosis. The patient was not orthostatic and neurology and cardiology evaluations were obtained and no additional testing was recommended. The patient had mildly elevated TSH. He will benefit from outpatient testing of his free T3 and T4 by his primary care physician. The rest of his medical problems remained stable. Dr. Darrold JunkerParaschos recommended that the patient be discharged home.  The patient is being discharged home in stable condition.   TIME SPENT: 45 minutes.    ____________________________ Darrick MeigsSangeeta Yasmene Salomone, MD sp:bjt D: 07/21/2012 16:17:22 ET T: 07/23/2012 10:55:29 ET JOB#: [id number]  cc: Darrick MeigsSangeeta Dorothie Wah, MD, <Dictator> Marcina MillardAlexander Paraschos, MD Darrick MeigsSANGEETA Dyasia Firestine MD ELECTRONICALLY SIGNED 07/23/2012 14:11

## 2014-12-24 DIAGNOSIS — D649 Anemia, unspecified: Secondary | ICD-10-CM | POA: Diagnosis not present

## 2014-12-24 DIAGNOSIS — I495 Sick sinus syndrome: Secondary | ICD-10-CM | POA: Diagnosis not present

## 2014-12-24 DIAGNOSIS — Z7901 Long term (current) use of anticoagulants: Secondary | ICD-10-CM | POA: Diagnosis not present

## 2014-12-24 DIAGNOSIS — I2699 Other pulmonary embolism without acute cor pulmonale: Secondary | ICD-10-CM | POA: Diagnosis not present

## 2014-12-24 DIAGNOSIS — Z950 Presence of cardiac pacemaker: Secondary | ICD-10-CM | POA: Diagnosis not present

## 2014-12-24 DIAGNOSIS — I10 Essential (primary) hypertension: Secondary | ICD-10-CM | POA: Diagnosis not present

## 2014-12-24 DIAGNOSIS — I4891 Unspecified atrial fibrillation: Secondary | ICD-10-CM | POA: Diagnosis not present

## 2014-12-26 DIAGNOSIS — Z950 Presence of cardiac pacemaker: Secondary | ICD-10-CM | POA: Diagnosis not present

## 2014-12-26 DIAGNOSIS — I4891 Unspecified atrial fibrillation: Secondary | ICD-10-CM | POA: Diagnosis not present

## 2014-12-26 DIAGNOSIS — I495 Sick sinus syndrome: Secondary | ICD-10-CM | POA: Diagnosis not present

## 2014-12-26 DIAGNOSIS — I2699 Other pulmonary embolism without acute cor pulmonale: Secondary | ICD-10-CM | POA: Diagnosis not present

## 2014-12-26 DIAGNOSIS — Z7901 Long term (current) use of anticoagulants: Secondary | ICD-10-CM | POA: Diagnosis not present

## 2014-12-26 DIAGNOSIS — I10 Essential (primary) hypertension: Secondary | ICD-10-CM | POA: Diagnosis not present

## 2014-12-26 DIAGNOSIS — D649 Anemia, unspecified: Secondary | ICD-10-CM | POA: Diagnosis not present

## 2014-12-27 NOTE — Consult Note (Signed)
Chief Complaint:  Subjective/Chief Complaint This is no chest pain improve palpitations still has some mild weakness no shortness of breath and feels much better   VITAL SIGNS/ANCILLARY NOTES: **Vital Signs.:   08-Aug-15 11:28  Vital Signs Type Routine  Temperature Temperature (F) 97.8  Celsius 36.5  Pulse Pulse 61  Respirations Respirations 18  Systolic BP Systolic BP 591  Diastolic BP (mmHg) Diastolic BP (mmHg) 67  Mean BP 89  Pulse Ox % Pulse Ox % 97  Pulse Ox Activity Level  At rest  Oxygen Delivery Room Air/ 21 %  *Intake and Output.:   08-Aug-15 11:30  Grand Totals Intake:  0 Output:      Net:  0 24 Hr.:  -550  Oral Intake      In:  0  Percentage of Meal Eaten  100   Brief Assessment:  GEN well developed, well nourished, no acute distress   Cardiac Irregular  murmur present  --Gallop   Respiratory normal resp effort   Gastrointestinal Normal   Gastrointestinal details normal Soft   EXTR negative cyanosis/clubbing, negative edema   Lab Results: Routine Chem:  08-Aug-15 05:06   BUN 14  Creatinine (comp)  1.53  Sodium, Serum 141  Potassium, Serum 3.8  Chloride, Serum  108  CO2, Serum 28  Calcium (Total), Serum 9.2  Anion Gap  5  Osmolality (calc) 281  eGFR (African American)  53  eGFR (Non-African American)  45 (eGFR values <17m/min/1.73 m2 may be an indication of chronic kidney disease (CKD). Calculated eGFR is useful in patients with stable renal function. The eGFR calculation will not be reliable in acutely ill patients when serum creatinine is changing rapidly. It is not useful in  patients on dialysis. The eGFR calculation may not be applicable to patients at the low and high extremes of body sizes, pregnant women, and vegetarians.)  Magnesium, Serum 1.8 (1.8-2.4 THERAPEUTIC RANGE: 4-7 mg/dL TOXIC: > 10 mg/dL  -----------------------)  Routine Hem:  08-Aug-15 05:06   WBC (CBC) 6.8  RBC (CBC)  4.02  Hemoglobin (CBC)  12.1  Hematocrit  (CBC)  35.5  Platelet Count (CBC) 207  MCV 88  MCH 30.1  MCHC 34.2  RDW 13.5  Neutrophil % 69.9  Lymphocyte % 20.7  Monocyte % 6.9  Eosinophil % 1.9  Basophil % 0.6  Neutrophil # 4.7  Lymphocyte # 1.4  Monocyte # 0.5  Eosinophil # 0.1  Basophil # 0.0 (Result(s) reported on 12 Apr 2014 at 05:45AM.)   Radiology Results: XRay:    06-Aug-15 20:37, Chest Portable Single View  Chest Portable Single View   REASON FOR EXAM:    Chest Pain  COMMENTS:       PROCEDURE: DXR - DXR PORTABLE CHEST SINGLE VIEW  - Apr 10 2014  8:37PM     CLINICAL DATA:  Weakness    EXAM:  PORTABLE CHEST - 1 VIEW    COMPARISON:  07/27/2011    FINDINGS:  Hyperinflation suggests COPD. Heart size and vascular pattern  normal. Two lead cardiac pacer unchanged. Lungs clear. No effusions.   IMPRESSION:  No active disease.      Electronically Signed    By: RSkipper ClicheM.D.    On: 04/10/2014 20:57         Verified By: RRachael Fee M.D.,   Assessment/Plan:  Assessment/Plan:  Assessment IMP atrial fibrillation  hypertension   hyperlipidemia  abnormal EKG  sick sinus syndrome  renal insufficiency .   Plan PLAN  continue telemetry  atrial fibrillation converted to sinus rhythm  continue amiodarone therapy for rhythm control  consider diltiazem for rate control  recommend long-term anticoagulation  continue low-dose aspirin for primary prevention  follow-up renal function  increased activity ambulate the halls  hopefully discharge home soon with follow-up with a AP   Electronic Signatures: Lujean Amel D (MD)  (Signed 10-Aug-15 16:04)  Authored: Chief Complaint, VITAL SIGNS/ANCILLARY NOTES, Brief Assessment, Lab Results, Radiology Results, Assessment/Plan   Last Updated: 10-Aug-15 16:04 by Yolonda Kida (MD)

## 2014-12-27 NOTE — Discharge Summary (Signed)
PATIENT NAME:  Edward [name], Edward [name] MR#:  [id number] DATE OF BIRTH:  04-23-1946  DATE OF ADMISSION:  04/11/2014 DATE OF DISCHARGE:    DISCHARGE DIAGNOSES:  1. Atrial flutter that has resolved.  2. Sinus tachycardia.   DISCHARGE MEDICATIONS:  1. Tamsulosin 0.4 mg 1 tablet daily.  2. Pantoprazole 40 mg p.o. daily.  3. Finasteride 5 mg p.o. daily.  4. Amiodarone 200 mg half-a-tablet once daily.  5. Klor-Con 10 mEq p.o. b.i.d.  6. Torsemide 10 mg p.o. daily.  7. Aspirin 325 mg p.o. daily.  8. Metoprolol 100 mg p.o. b.i.d.  9. Vitamin B12 1000 mcg p.o. daily.   CONSULTS: Cardiology.   PROCEDURES: None.   PERTINENT LABORATORIES AND STUDIES: On day of discharge, sodium 143, potassium 3.7, creatinine 1.35, glucose of 86, white blood cell count 7.4, hemoglobin 11.9, platelets 222,000. Cardiac enzymes are negative.   BRIEF HOSPITAL COURSE: Atrial flutter. The patient initially came in with atrial flutter with rates in the 130s, symptomatic. He was placed on amiodarone, which did convert him back to a sinus rhythm and he was paced for a period of time, and now he is in sinus tachycardia. Our plan is to increase the metoprolol to 100 mg p.o. b.i.d. He was evaluated by cardiology. No further recommendations from their standpoint, no anticoagulations besides the aspirin. He will be followed up with cardiology as an outpatient, and also follow up with Dr. Dareen PianoAnderson as an outpatient.   Other chronic issues are stable at this time. We will continue with the home regimen.   DISPOSITION: He is in stable condition and will be discharged to home. He is asymptomatic with appropriate follow-up.    ____________________________ Edward IvanKanhka Naftuli Dalsanto, MD kl:jr D: 04/13/2014 11:15:27 ET T: 04/13/2014 11:23:55 ET JOB#: [id number]  cc: Edward IvanKanhka Shunte Senseney, MD, <Dictator> Edward IvanKANHKA Emaya Preston MD ELECTRONICALLY SIGNED 05/05/2014 8:25

## 2014-12-27 NOTE — H&P (Signed)
PATIENT NAME:  Edward [name], Edward [name] MR#:  [id number] DATE OF BIRTH:  [birth year]  DATE OF ADMISSION:  08/12/2014  REFERRING PHYSICIAN:  Lucrezia Europe, MD  PRIMARY CARE PHYSICIAN:  Einar Crow, MD   ADMISSION DIAGNOSIS: Cellulitis of sepsis and acute on chronic kidney disease.   HISTORY OF PRESENT ILLNESS: This is a 69 year old Caucasian male who presents to the Emergency Department complaining that he "felt numb over."  The patient further states that he "thought it might be Afib" because he felt palpitations.  He denies pain anywhere. The patient denies fatigue, or fever, or weakness, but in the Emergency Department was found to be hypotensive with systolic blood pressures in the 80s.  Also, there was a report by EMS that he had a temperature of 103 at home.  They gave him 1 gram of Tylenol, and by the time he arrived in the Emergency Department, he was afebrile.  Due to his sepsis syndrome, the Emergency Department called for admission.   REVIEW OF SYSTEMS:   CONSTITUTIONAL: The patient denies fever or weakness.  EYES: Denies inflammation or blurred vision.  EARS, NOSE AND THROAT: Denies tinnitus or difficulty swallowing.  RESPIRATORY: Denies cough or shortness of breath.  CARDIOVASCULAR: Denies chest pain, orthopnea, or paroxysmal nocturnal dyspnea. The patient admits to palpitations.  GENITOURINARY: Denies dysuria, increased frequency or hesitancy of urination.  GASTROINTESTINAL: Denies nausea, vomiting or diarrhea.  ENDOCRINE: Denies polyuria, polydipsia.  HEMATOLOGIC AND LYMPHATIC: Denies easy bruising or bleeding.  INTEGUMENT: Denies rashes or lesions.  MUSCULOSKELETAL: Denies myalgias or arthralgias.  NEUROLOGIC: Denies numbness in his extremities or dysarthria.  PSYCHIATRIC: Denies depression or suicidal ideation.   PAST MEDICAL HISTORY: Atrial fibrillation, hypertension, benign prostatic hypertrophy, gastroesophageal reflux disease, and recurrent syncope.   PAST SURGICAL  HISTORY: Pacemaker placement as well as hemorrhoidectomy.   SOCIAL HISTORY: The patient lives with his wife. He does not smoke, drink, or do drugs.   FAMILY HISTORY: The patient's father is deceased of some form of leukemia and his brother is deceased of lung cancer.   MEDICATIONS:  1.  Amiodarone 200 mg 1/2 tablet p.o. daily.  2.  Aspirin 325 mg 1 tab p.o. daily.  3.  Finasteride 5 mg 1 tab p.o. daily.  4.  Klor-Con 10 mEq extended release 1 tablet p.o. b.i.d.  5.  Metoprolol tartrate 100 mg 1 tab p.o. b.i.d.  6.  Pantoprazole 40 mg extended release 1 tablet p.o. daily.  7.  Tamsulosin 0.4 mg 1 capsule p.o. daily.  8.  Torsemide 10 mg 1 tab p.o. daily.  9.  Vitamin B12 at 1000 mcg 1 tablet p.o. daily.   ALLERGIES: PENICILLIN.   PERTINENT LABORATORY RESULTS AND RADIOGRAPHIC FINDINGS:  Serum glucose 169. BUN 20, creatinine 1.77, serum sodium 140, potassium is 3.8, chloride 105, bicarbonate 24, calcium is 8.7, phosphorus is 1.7, serum albumin is 3, alkaline phosphatase 62, AST 14, ALT is 14.  White blood cell count 17.5, hemoglobin is 11.5, hematocrit is 35.4, platelet count 151,000, MCV is 93.  INR is 1.5.  Urinalysis shows 30 mg/dL of protein, is nitrite negative. 1+ leukocyte esterase is seen with 6 white blood cells per high-power field and 18 red blood cells per high-power field.  The patient states that he is asymptomatic for urinary tract infection.  Lactic acid via venous blood draw is 2.7.  Chest x-ray shows no active disease.   PHYSICAL EXAMINATION:  VITAL SIGNS: Temperature is 97.5, pulse 96, respirations 18, blood pressure 120/75, pulse oximetry is 97%  on room air.  GENERAL: The patient is alert and oriented x 3 in no apparent distress.  HEENT: Normocephalic, atraumatic. Pupils equal, round, and reactive to light and accommodation. Extraocular movements are intact. Mucous membranes are moist.  NECK: Trachea is midline. No adenopathy.  CHEST: Symmetric, atraumatic.   CARDIOVASCULAR: Regular rate and rhythm. Normal S1, S2. No rubs, clicks, or murmurs.  LUNGS: Clear to auscultation bilaterally. Normal effort and excursion.  ABDOMEN: Positive bowel sounds. Soft, nontender, nondistended. No hepatosplenomegaly.  GENITOURINARY: Deferred.  MUSCULOSKELETAL: The patient moves all 4 extremities equally. There is 5/5 strength in upper and lower extremities bilaterally.  SKIN: There are no rashes, but the patient does have a Band-Aid over an unroofed blister on his left medial ankle and a surrounding area of erythema, tenderness and swelling around the lower leg above the ankle.  EXTREMITIES: No clubbing, cyanosis. There is some edema of the left ankle as described above.  NEUROLOGIC: Cranial nerves II through XII are grossly intact.  PSYCHIATRIC: Mood is normal but affect is odd. The patient is somewhat reticent and then abnormally glib when answering relatively mundane questions.   ASSESSMENT AND PLAN: This is a 69 year old male admitted for cellulitis, sepsis, and acute on chronic kidney disease.  1.  Cellulitis.  Infected blister on the left ankle. Unclear etiology, but the patient states that [facility] similar problem.  He reports having surgery, which I assume may be an incision and drainage or debridement of that area; however, there do not appear to be any surgical changes. When asked where or how he developed the blister, he states "[facility]  Details aside, the patient is currently on vancomycin and aztreonam for his PENICILLIN ALLERGY.  Blood cultures have been obtained.  2.  Sepsis. The patient meets criteria via leukocytosis and hypotension.  He is fluid responsive and he is nearly fluid resuscitated as his blood pressure is normal at the time of this dictation. We will continue IV hydration.  3.  Acute on chronic kidney disease. The patient had had a worsening of stage III chronic kidney disease. We  will continue IV fluid.  4.  Atrial fibrillation appears to be paroxysmal.  We will continue amiodarone as well as metoprolol for rate control.  5.  Hypertension. Will consider holding the patient's torsemide while he is hypotensive. Once he is euvolemic, we may restart as he apparently has a history of either venous insufficiency or mild congestive heart failure.  6.  Benign prosthetic hypertrophy. Continue finasteride every morning and tamsulosin every evening.  7.  Gastroesophageal reflux disease.  H2-blocker as needed.  8.  Obesity. The patient's body mass index is 32.3.  I have encouraged a heart healthy diet, as well as exercise when the patient is well.  9.  Deep vein thrombosis prophylaxis, heparin.  10.  Gastrointestinal prophylaxis as above.  CODE STATUS:  The patient is a FULL CODE.   TIME SPENT ON ADMISSION ORDERS AND PATIENT CARE: Approximately 35 minutes.     ____________________________ Kelton PillarMichael S. Sheryle Hailiamond, MD msd:DT D: 08/12/2014 07:41:10 ET T: 08/12/2014 08:11:05 ET JOB#: [id number]  cc: Kelton PillarMichael S. Sheryle Hailiamond, MD, <Dictator> Kelton PillarMICHAEL S Milayna Rotenberg MD ELECTRONICALLY SIGNED 08/22/2014 0:04

## 2014-12-27 NOTE — Consult Note (Signed)
Chief Complaint:  Subjective/Chief Complaint Patient doing much better to do feels more energetic slept well denies any pain.   VITAL SIGNS/ANCILLARY NOTES: **Vital Signs.:   09-Aug-15 07:45  Vital Signs Type Pre Medication  Temperature Temperature (F) 98.4  Celsius 36.8  Temperature Source oral  Pulse Pulse 60  Respirations Respirations 18  Systolic BP Systolic BP 400  Diastolic BP (mmHg) Diastolic BP (mmHg) 74  Mean BP 94  Pulse Ox % Pulse Ox % 98  Pulse Ox Activity Level  At rest  Oxygen Delivery Room Air/ 21 %  *Intake and Output.:   Daily 09-Aug-15 07:00  Grand Totals Intake:  1769 Output:  2595    Net:  -826 24 Hr.:  -826  Oral Intake      In:  240  IV (Primary)      In:  1529  Urine ml     Out:  2595  Length of Stay Totals Intake:  4304 Output:  5920    Net:  -1616   Brief Assessment:  GEN well developed, well nourished, no acute distress   Cardiac Regular  murmur present  --Gallop   Respiratory normal resp effort  clear BS   Gastrointestinal Normal   Gastrointestinal details normal Soft  Bowel sounds normal   EXTR negative cyanosis/clubbing, negative edema   Lab Results: Routine Chem:  09-Aug-15 04:55   Glucose, Serum 86  BUN 11  Creatinine (comp)  1.35  Sodium, Serum 143  Potassium, Serum 3.7  Chloride, Serum 107  CO2, Serum 26  Calcium (Total), Serum 8.6  Anion Gap 10  Osmolality (calc) 284  eGFR (African American) >60  eGFR (Non-African American)  53 (eGFR values <75m/min/1.73 m2 may be an indication of chronic kidney disease (CKD). Calculated eGFR is useful in patients with stable renal function. The eGFR calculation will not be reliable in acutely ill patients when serum creatinine is changing rapidly. It is not useful in  patients on dialysis. The eGFR calculation may not be applicable to patients at the low and high extremes of body sizes, pregnant women, and vegetarians.)  Routine Hem:  09-Aug-15 04:55   WBC (CBC) 7.4  RBC (CBC)   3.90  Hemoglobin (CBC)  11.9  Hematocrit (CBC)  34.3  Platelet Count (CBC) 222  MCV 88  MCH 30.5  MCHC 34.6  RDW 13.9  Neutrophil % 73.3  Lymphocyte % 17.0  Monocyte % 6.6  Eosinophil % 2.2  Basophil % 0.9  Neutrophil # 5.4  Lymphocyte # 1.3  Monocyte # 0.5  Eosinophil # 0.2  Basophil # 0.1 (Result(s) reported on 13 Apr 2014 at [facility])   Radiology Results: XRay:    06-Aug-15 20:37, Chest Portable Single View  Chest Portable Single View   REASON FOR EXAM:    Chest Pain  COMMENTS:       PROCEDURE: DXR - DXR PORTABLE CHEST SINGLE VIEW  - Apr 10 2014  8:37PM     CLINICAL DATA:  Weakness    EXAM:  PORTABLE CHEST - 1 VIEW    COMPARISON:  07/27/2011    FINDINGS:  Hyperinflation suggests COPD. Heart size and vascular pattern  normal. Two lead cardiac pacer unchanged. Lungs clear. No effusions.   IMPRESSION:  No active disease.      Electronically Signed    By: RSkipper ClicheM.D.    On: 04/10/2014 20:57         Verified By: RRachael Fee M.D.,   Assessment/Plan:  Assessment/Plan:  Assessment IMP  atrial fibrillation  acute on chronic renal insufficiency  sick sinus syndrome  permanent pacemaker placement  hypertension  abnormal EKG .   Plan PLAN atrial fibrillation convert to sinus rhythm now paced  anticoagulation long-term for atrial fibrillation risk  diltiazem for blood pressure support and rate control  continue amiodarone therapy at 200 mg a day for rhythm control  follow-up renal function as an outpatient as it is improving  follow-up pacemaker check as an  out patient  okay to discharge today  follow-up with Cardiology in 1-2 weeks   Electronic Signatures: Lujean Amel D (MD)  (Signed 10-Aug-15 16:11)  Authored: Chief Complaint, VITAL SIGNS/ANCILLARY NOTES, Brief Assessment, Lab Results, Radiology Results, Assessment/Plan   Last Updated: 10-Aug-15 16:11 by Yolonda Kida (MD)

## 2014-12-27 NOTE — H&P (Signed)
PATIENT NAME:  Fransico SettersHOMPSON, Adeel C MR#:  [id number] DATE OF BIRTH:  1946-01-23  DATE OF ADMISSION:  04/11/2014  REFERRING PHYSICIAN:  Dr. Sharyn CreamerMark Quale.   PRIMARY CARE PHYSICIAN: Dr. Einar CrowMarshall Anderson.   PRIMARY CARDIOLOGIST: Dr. Cassie FreerParachos.   CHIEF COMPLAINT: Dizziness, atrial fibrillation with rapid ventricular rate.    HISTORY OF PRESENT ILLNESS: This is a 69 year old male with known history of recurrent syncope, tachybrady syndrome, status post pacemaker, history of atrial fibrillation and atrial flutter. The patient presents today with complaints of dizziness and shortness of breath. Upon presentation, the patient was noticed to be in atrial flutter, with rapid ventricular response heart rate being in the 130s. He received IV Cardizem x 2 which resulted in improvement of his heart rate which currently remains in the low 90s. As well, the patient was noticed to be in acute renal failure with a creatinine of 1.86, he reports decreased p.o. intake over the last day. Has been in the hot weather all day today. As well, he is on diuresis with Torsemide. Hospitalist service was requested to admit the patient for further hydration given his acute renal failure and monitoring and management of his atrial fibrillation. The patient denies any chest pain, currently denies any shortness of breath. Reports some dry cough, no productive sputum. No dysuria, no polyuria, no fever.   PAST MEDICAL HISTORY:  1. Recurrent syncope.  2. History of GI bleed due to hemorrhoids.  3. Benign prostatic hypertrophy.  4. Atrial fibrillation and atrial flutter.  5. History of tachybrady syndrome status post pacemaker.  6. Hypertension.  7. B12 deficiency.  8. GERD.   9. History of renal insufficiency.   SOCIAL HISTORY: Married, lives with his wife. No smoking. No alcohol use.   FAMILY HISTORY: No family history of coronary artery disease at young age in the family.   ALLERGIES: PENICILLIN.   HOME MEDICATIONS:  1.  Finasteride 5 mg oral daily.  2. Aspirin 324 mg of aspirin daily.  3. Tamsulosin 0.4 mg daily. 4. Amiodarone 100 mg oral daily.  5. Metoprolol tartrate 25 mg oral b.i.d.  6. Torsemide 10 mg daily.  7. Potassium 10 mEq oral 2 times a day.  8. Pantoprazole 40 mg oral daily. 9. Vitamin B12, (Dictation Anomaly) <MISSING TEXT>  mcg oral with meals.  REVIEW OF SYSTEMS:  CONSTITUTIONAL: The patient denies fever, chills. Reports fatigue, dizziness. No weight gain or no weight loss.  EYES: Denies blurry vision, double vision, inflammation, glaucoma.  EARS, NOSE AND THROAT:  Denies tinnitus, ear pain, hearing loss, epistaxis.  RESPIRATORY: Denies cough, wheezing, hemoptysis, palpitation, chronic obstructive pulmonary disease. Reports some mild shortness of breath initially, but nothing now.  CARDIOVASCULAR: Denies chest pain, palpitations, syncope, edema.  GASTROINTESTINAL: Denies nausea, vomiting, diarrhea, hematemesis.  GENITOURINARY: Denies dysuria, hematuria, renal colic.  ENDOCRINE: Denies polyuria, polydipsia, heat or cold intolerance.  HEMATOLOGY: Denies anemia, easy bruising, bleeding diathesis.  INTEGUMENT: Denies acne, rash, or skin lesion.  MUSCULOSKELETAL: Denies any swelling, gout, cramps, arthritis.  NEUROLOGIC: Denies CVA, transient ischemic attack, seizures. No weight loss, headache, ataxia, reports some dizziness.  PSYCHIATRIC: Denies anxiety, insomnia, or depression.   PHYSICAL EXAMINATION:  VITAL SIGNS: Temperature 98, pulse 93, respiratory rate 18, blood pressure 109/61, saturating 97% on room air.  GENERAL: Well-nourished male who looks comfortable in bed, in no apparent distress.  HEENT: Head atraumatic, normocephalic. Pupils equal, reactive to light. Pink conjunctivae. Anicteric sclerae. Dry oral mucosa. No oral thrush. No nasal discharge.  NECK: Supple. No thyromegaly. No  JVD. No carotid bruits. No cervical lymphadenopathy.  CHEST: Good air entry bilaterally. No wheezing,  rales, or rhonchi. Has left upper chest pacemaker.  CARDIOVASCULAR: S1, S2 heard. No rubs, murmur or gallops. Irregularly irregular. PMI is nondisplaced.  ABDOMEN: Soft, nontender, nondistended. Bowel sounds present.  EXTREMITIES: No edema. No clubbing. No cyanosis. Pedal pulses +2 bilaterally.  PSYCHIATRIC: Appropriate affect. Awake, alert x 3. Intact judgment and insight.  NEUROLOGIC: Cranial nerves grossly intact. Motor 5 out of 5. No focal deficits.  MUSCULOSKELETAL: No joint effusion or erythema.  SKIN: Normal skin turgor. Warm and dry.  PERTINENT LABORATORY DATA: Glucose 115. BNP 1697, BUN 16, creatinine 1.86, sodium 141, potassium 3.6, chloride 108, CO2 25. Troponin less than 0.02. White blood cells 14.2, hemoglobin 12.4, hematocrit 36.6, platelets 232,000.   CHEST X-RAY: No active disease.   ASSESSMENT AND PLAN:  1. Atrial fibrillation with rapid ventricular response, this is most likely due to dehydration, currently the patient is rate controlled but given the fact it is in the 90s we will increase his metoprolol to 25 q. 6 p.r.n., continue him on amiodarone. We will continue him on aspirin. Continue to cycle his cardiac enzymes, follow the trend, and we will hydrate. We will admit him to telemetry.  2. Acute renal failure. This is due to decrease oral intake and dehydration. We will continue with intravenous normal saline. We will hold his torsemide.  3. Leukocytosis. Will check urinalysis to rule out infection.  4. Benign prostatic hypertrophy. Continue with finasteride and tamsulosin.  5. History of tachybrady syndrome. Continue with telemetry monitor. The patient is status post pacemaker.  6. Hypertension. Blood pressure is acceptable. Continue with home medications.  7. B12 deficiency. Continue with B12 supplements.  8. Gastroesophageal reflux disease. Continue with proton pump inhibitor.  9. Deep vein thrombosis prophylaxis. Subcutaneous heparin.   CODE STATUS: Full code.    TOTAL TIME SPENT ON ADMISSION AND PATIENT CARE: 55 minutes.     ____________________________ Starleen Arms, MD dse:jh D: 04/11/2014 01:21:39 ET T: 04/11/2014 02:22:42 ET JOB#: [id number]  cc: Starleen Arms, MD, <Dictator> Kilynn Fitzsimmons Teena Irani MD ELECTRONICALLY SIGNED 04/11/2014 7:35

## 2014-12-27 NOTE — Consult Note (Signed)
PATIENT NAME:  Edward [name], Edward [name] MR#:  [id number] DATE OF BIRTH:  11/14/1945  DATE OF CONSULTATION:  04/11/2014  REFERRING PHYSICIAN:   CONSULTING PHYSICIAN:  Dwayne D. Juliann Pares, MD  PRIMARY CARE PHYSICIAN: Einar Crow, MD  CARDIOLOGIST: Marcina Millard, MD  INDICATION FOR CONSULTATION: Rapid atrial fibrillation with vertigo and shortness of breath.  HISTORY OF PRESENT ILLNESS: Edward [name] is a 69 year old white male with known tachybrady syndrome, history of syncope, status post permanent pacemaker placement with paroxysmal atrial fibrillation. The patient has been treated with amiodarone and a permanent pacemaker in the past. He had been doing reasonably well but overexerted himself in the heat, got short of breath and started having palpitations and vertigo. With this he came to the Emergency Room and was found to have rapid atrial fibrillation, rate in the 130s, placed on Cardizem. He had some mild improvement. He has a known history of renal insufficiency with creatinine of 1.8. He has been not eating and drinking as much because of the heat and may have gotten slightly dehydrated. He has been on torsemide as well. Because of the rapid atrial fibrillation, shortness of breath and vertigo he was admitted for further evaluation. He denied any fever or symptoms.   PAST MEDICAL HISTORY: Syncope, bleeding hemorrhoids, BPH, atrial fibrillation, tachybrady syndrome, hypertension, B12 deficiency, GERD, renal insufficiency.   SOCIAL HISTORY: Lives with his wife. Denies smoking or alcohol consumption.   FAMILY HISTORY: Negative.  ALLERGIES: PENICILLIN.   HOME MEDICATIONS: Finasteride 5 mg a day, aspirin 325 a day, tamsulosin 0.4 mg a day, amiodarone 100 a day, metoprolol 25 twice a day, torsemide 10 a day, potassium 10 mEq twice a day, Protonix 40 a day, vitamin B12.   REVIEW OF SYSTEMS: He has had syncope in the past and blackout spells in the past. Denies fever or chills or sweats. No  weight loss. No weight gain. Denies hemoptysis, hematemesis. No bright red blood per rectum recently. No vision change or hearing change. Denies sputum production or cough, significant shortness of breath, palpitations, tachycardia. No chest pain.  PHYSICAL EXAMINATION: VITAL SIGNS: Blood pressure 110/60, pulse initially was 130 but now is around 90 and irregular, respiratory rate 16, afebrile.  HEENT: Normocephalic, atraumatic. Pupils equal and reactive to light.  NECK: Supple. No significant JVD, bruits or adenopathy.  LUNGS: Clear to auscultation and percussion. No significant wheeze, rhonchi, or rale.  HEART: Irregularly irregular. Systolic ejection murmur left sternal border. PMI nondisplaced.  ABDOMEN: Benign.  EXTREMITIES: Within normal limits. NEUROLOGIC: Normal.   ASSESSMENT:  1.  Rapid atrial fibrillation. 2.  Tachybrady syndrome. 3.  Dehydration.  4.  Renal insufficiency.  5.  Benign prostatic hypertrophy. 6.  B12 deficiency. 7.  History of hypertension. 8.  Reflux disease.  DIAGNOSTIC DATA: Glucose 115. BNP 1700. BUN 16, creatinine 1.86,  sodium 141, potassium 3.6, chloride 108, CO2 25. Troponin less than 0.02. White count 14, hemoglobin 12.4, hematocrit 36, platelet count 232,000.   PLAN: 1.  Agree with admit. Place on telemetry. Continue amiodarone for rhythm control. Will probably increase the dose to 200 a day after small load. Would add rate control with Cardizem. Recommend echocardiogram for reassessment of LV function. Hopefully his rhythm can be controlled.  2.  I am concerned that he is not a candidate for anticoagulation  with his renal insufficiency and history of hypertension. We will consider long-term anticoagulation and aspirin.  3.  Renal insufficiency. Consider nephrology consult and input.  4.  Leukocytosis. Rule out source for  any infection. This may be an acute phase reactant.  5.  For benign prostatic hypertrophy, continue tamsulosin. 6.  Hypertension.  Appears to be adequately controlled right now. We will probably add Cardizem or beta blocker in addition.  7.  Continue Protonix for reflux symptoms.   We will continue to watch the patient, consider long-term anticoagulation. Will control rate but not attempt to cardiovert at this point until he is sufficiently anticoagulated. Pacemaker interrogation at this point does not appear to be necessary. We will consider treatment and have the patient follow up with his primary cardiologist, Dr. Darrold JunkerParaschos, as an outpatient in 1 to 2 weeks. We will continue to follow while he is in the hospital.  ____________________________ Bobbie Stackwayne D. Juliann Paresallwood, MD ddc:sb D: 04/11/2014 14:30:26 ET T: 04/11/2014 15:08:44 ET JOB#: [id number]  cc: Dwayne D. Juliann Paresallwood, MD, <Dictator> Alwyn PeaWAYNE D CALLWOOD MD ELECTRONICALLY SIGNED 04/30/2014 14:08

## 2014-12-27 NOTE — Discharge Summary (Signed)
PATIENT NAME:  Edward [name], Edward [name] MR#:  [id number] DATE OF BIRTH:  1945-09-17  DATE OF ADMISSION:  08/12/2014 DATE OF DISCHARGE:  08/14/2014  DISCHARGE DIAGNOSES:  1.  Cellulitis. 2.  Group B streptococcus bacteremia.  3.  Group B streptococcus urinary tract infection.  4. Atrial fibrillation.  5.  Benign prostatic hypertrophy.   DISCHARGE MEDICATIONS: [facility] medication [facility]. Briefly, he will be on his usual home medications plus Levaquin 500 daily for 3 weeks given that this was recurrent cellulitis with the above.   HISTORY AND PHYSICAL: Please see detailed history and physical done on admission.   HOSPITAL COURSE: The patient was admitted and put on aztreonam, IV vancomycin, given the fact that this was recurrent from a previous hospital admission at a different facility recently. He improved on that regimen. Ultimately grew group B strep in his blood and his urine, resistant to clindamycin but sensitive to Levaquin, ampicillin, linezolid and vancomycin; this was in 1/2 blood cultures and in the urine culture as noted. His white count was initially elevated, improved to normal, and his acute renal failure, which was likely prerenal, improved to his usual state of CKD 3 with a creatinine of 1.46. Discussed this with them, and also discussed the fact that we will get an outpatient infectious disease consult and make sure no further workup needs to be done, but as well as he is doing at this point further inpatient evaluation does not seem warranted. He will call if he is having trouble. I will see him next week and infectious disease will see him as soon as possible.   TIME SPENT: Please note, it took approximately 35 minutes to do all discharge tasks today.   ____________________________ Marya AmslerMarshall W. Dareen PianoAnderson, MD mwa:TT D: 08/14/2014 08:11:36 ET T: 08/14/2014 15:41:40 ET JOB#: [id number]  cc: Marya AmslerMarshall W. Dareen PianoAnderson, MD, <Dictator> Lauro RegulusMARSHALL W ANDERSON MD ELECTRONICALLY SIGNED  08/18/2014 13:07

## 2014-12-29 DIAGNOSIS — I2699 Other pulmonary embolism without acute cor pulmonale: Secondary | ICD-10-CM | POA: Diagnosis not present

## 2014-12-29 DIAGNOSIS — I4891 Unspecified atrial fibrillation: Secondary | ICD-10-CM | POA: Diagnosis not present

## 2014-12-29 DIAGNOSIS — I10 Essential (primary) hypertension: Secondary | ICD-10-CM | POA: Diagnosis not present

## 2014-12-29 DIAGNOSIS — I495 Sick sinus syndrome: Secondary | ICD-10-CM | POA: Diagnosis not present

## 2014-12-29 DIAGNOSIS — D649 Anemia, unspecified: Secondary | ICD-10-CM | POA: Diagnosis not present

## 2014-12-29 DIAGNOSIS — Z950 Presence of cardiac pacemaker: Secondary | ICD-10-CM | POA: Diagnosis not present

## 2014-12-29 DIAGNOSIS — Z7901 Long term (current) use of anticoagulants: Secondary | ICD-10-CM | POA: Diagnosis not present

## 2014-12-30 ENCOUNTER — Emergency Department
Admit: 2014-12-30 | Disposition: A | Discharge status: Home / Self Care | Payer: Self-pay | Admitting: Emergency Medicine

## 2014-12-30 DIAGNOSIS — I10 Essential (primary) hypertension: Secondary | ICD-10-CM | POA: Diagnosis not present

## 2014-12-30 DIAGNOSIS — Z7902 Long term (current) use of antithrombotics/antiplatelets: Secondary | ICD-10-CM | POA: Diagnosis not present

## 2014-12-30 DIAGNOSIS — Z880 Allergy status to penicillin: Secondary | ICD-10-CM | POA: Diagnosis not present

## 2014-12-30 DIAGNOSIS — R404 Transient alteration of awareness: Secondary | ICD-10-CM | POA: Diagnosis not present

## 2014-12-30 DIAGNOSIS — R42 Dizziness and giddiness: Secondary | ICD-10-CM | POA: Diagnosis not present

## 2014-12-30 DIAGNOSIS — E860 Dehydration: Secondary | ICD-10-CM | POA: Diagnosis not present

## 2014-12-30 DIAGNOSIS — Z79899 Other long term (current) drug therapy: Secondary | ICD-10-CM | POA: Diagnosis not present

## 2014-12-30 DIAGNOSIS — R531 Weakness: Secondary | ICD-10-CM | POA: Diagnosis not present

## 2014-12-30 LAB — COMPREHENSIVE METABOLIC PANEL
ANION GAP: 9 (ref 7–16)
Albumin: 4.7 g/dL
Alkaline Phosphatase: 67 U/L
BUN: 27 mg/dL — AB
Bilirubin,Total: 1.4 mg/dL — ABNORMAL HIGH
CO2: 29 mmol/L
Calcium, Total: 9.8 mg/dL
Chloride: 101 mmol/L
Creatinine: 1.7 mg/dL — ABNORMAL HIGH
EGFR (African American): 46 — ABNORMAL LOW
EGFR (Non-African Amer.): 40 — ABNORMAL LOW
Glucose: 100 mg/dL — ABNORMAL HIGH
Potassium: 3.8 mmol/L
SGOT(AST): 20 U/L
SGPT (ALT): 10 U/L — ABNORMAL LOW
Sodium: 139 mmol/L
TOTAL PROTEIN: 8.1 g/dL

## 2014-12-30 LAB — CBC
HCT: 40.7 % (ref 40.0–52.0)
HGB: 13.4 g/dL (ref 13.0–18.0)
MCH: 30.3 pg (ref 26.0–34.0)
MCHC: 33 g/dL (ref 32.0–36.0)
MCV: 92 fL (ref 80–100)
PLATELETS: 292 10*3/uL (ref 150–440)
RBC: 4.42 10*6/uL (ref 4.40–5.90)
RDW: 13.8 % (ref 11.5–14.5)
WBC: 6.8 10*3/uL (ref 3.8–10.6)

## 2014-12-30 LAB — URINALYSIS, COMPLETE
BACTERIA: NONE SEEN
Bilirubin,UR: NEGATIVE
Blood: NEGATIVE
Glucose,UR: NEGATIVE mg/dL (ref 0–75)
Ketone: NEGATIVE
NITRITE: NEGATIVE
PROTEIN: NEGATIVE
Ph: 5 (ref 4.5–8.0)
SPECIFIC GRAVITY: 1.013 (ref 1.003–1.030)

## 2014-12-30 LAB — TROPONIN I

## 2015-01-04 NOTE — Consult Note (Signed)
Brief Consult Note: Diagnosis: int and ext hemorrhoids.   Patient was seen by consultant.   Consult note dictated.   Recommend further assessment or treatment.   Orders entered.   Discussed with Attending MD.   Comments: plan medical management of noninflamed hemorrhoids. Pt needs colonoscopy 9last was in 1996). rec GI consult as outpt.  Electronic Signatures: Lattie Hawooper, Gautham Hewins E (MD)  (Signed 10-Apr-16 14:14)  Authored: Brief Consult Note   Last Updated: 10-Apr-16 14:14 by Lattie Hawooper, Reznor Ferrando E (MD)

## 2015-01-04 NOTE — Consult Note (Signed)
PATIENT NAME:  Edward [name], Edward [name] MR#:  [id number] DATE OF BIRTH:  [birth year]  DATE OF CONSULTATION:  12/14/2014  REFERRING PHYSICIAN:   CONSULTING PHYSICIAN:  Adah Salvageichard E. Excell Seltzerooper, MD  CHIEF COMPLAINT: Hemorrhoids.   HISTORY OF PRESENT ILLNESS:  Dr. Cherlynn KaiserSainani and I spoke personally concerning this patient's care.  He asked me to see the patient for hemorrhoids that bleed on occasion, and that has been fairly chronic.  However, the patient has been admitted to the hospital with a pulmonary embolus and requires anticoagulation.  He is currently on a heparin drip.   The patient has not had a colonoscopy in approximately 20 years, and he has had a prior hemorrhoidectomy, or at least hemorrhoid banding.    He describes some blood on the toilet paper and occasional bleeding, has never used any medications including steroids, or even over-the-counter drugs, such as Preparation H.   PAST MEDICAL HISTORY: Atrial fibrillation, hypertension, now pulmonary embolus, BPH, reflux disease and pacemaker placement.   PAST SURGICAL HISTORY: Pacemaker and hemorrhoidectomy either formal or banded, not clear.   ALLERGIES: PENICILLIN.   MEDICATIONS: Multiple, see chart, including heparin drip at this point.   SOCIAL HISTORY: The patient does not smoke or drink.   FAMILY HISTORY: No history of colon cancer.   REVIEW OF SYSTEMS:  A complete system review is performed and negative with the exception of that mentioned in the history of present illness.   PHYSICAL EXAMINATION:  GENERAL: Healthy, elderly-appearing male patient.  VITAL SIGNS: Temperature of 97.9, pulse 61, respirations 20, blood pressure 116/71, 96% room air sat.  HEENT: No scleral icterus.  NECK: No palpable neck nodes.  CHEST: Clear to auscultation.  CARDIAC: Regular rate and rhythm.  ABDOMEN: Soft, nontender.  EXTREMITIES: Show moderate edema.  NEUROLOGIC: Grossly intact.  INTEGUMENT: No jaundice.  RECTAL: Demonstrates external hemorrhoidal  tags, moderate-sized internal hemorrhoids with no active bleeding or blood on this examination.   RECENT LABORATORY VALUES:  Demonstrate a hemoglobin and hematocrit of 11.7 and 34.3, with a platelet count of 187,000.  Electrolytes are within normal limits.   ASSESSMENT AND PLAN: This is a patient with hemorrhoidal bleeding, which is minor in nature and not currently inflamed.  He has not had symptoms suggestive of external hemorrhoidal thrombosis.  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  As the patient has not tried any sort of over-the-counter drugs and has never been on steroidal creams, I have recommended Proctofoam-HC every 12 hours for 5 days and then switching to over-the-counter medication.  I can see him in the office to assess his course at a later date.    Also of note, the patient has not had a colonoscopy in 20 years.  With his mild anemia and bleeding, and age, he requires a colonoscopy.  I have suggested that he obtain an outpatient gastroenterology consultation for that.       ____________________________ Adah Salvageichard E. Excell Seltzerooper, MD rec:DT D: 12/14/2014 14:25:51 ET T: 12/14/2014 16:19:06 ET JOB#: [id number]  cc: Adah Salvageichard E. Excell Seltzerooper, MD, <Dictator> Lattie HawICHARD E Mikhai Bienvenue MD ELECTRONICALLY SIGNED 12/14/2014 18:58

## 2015-01-04 NOTE — H&P (Signed)
PATIENT NAME:  Edward [name], DIEHL MR#:  [id number] DATE OF BIRTH:  08/25/1946  DATE OF ADMISSION:  12/13/2014  ADMITTING PHYSICIAN: Enid Baas, MD   PRIMARY CARE PHYSICIAN: Marya Amsler. Dareen Piano, MD   CHIEF COMPLAINT: Chest pain.   HISTORY OF PRESENT ILLNESS: Mr. Edward [name] is a 69 year old male with past medical history significant for atrial fibrillation, status post pacemaker on anticoagulation only with aspirin, hypertension, benign prostatic hypertrophy, gastroesophageal reflex disease, presents to the hospital secondary to chest pain that started last night. The patient is a poor historian. He states that he was doing fine. He is active at baseline. Last evening, suddenly he started a sharp pain in the middle of the chest and also towards his right side, and it is pleuritic in nature. It hurts more and sharp when he takes deep breaths. He has not had similar pain in the past. All night long, the pain did not let him sleep. He was still suffering, so he presented to the ER. Laboratories here showed elevated D-dimer, for which follow-up CT chest with contrast was done, and it shows bilateral pulmonary embolism, larges clot burden in right middle lobe or pulmonary artery, and a possible opacity in right lower lobe, could be infarct versus consolidation. So, the patient is being admitted for pain control and treatment of his PE. The patient does have chronic lower extremity swelling of 1+, takes torsemide, and recent cellulitis of his left leg, so it appears more swollen than the right one. Denies any recent travel or immobilized for a prolonged period of time. No recent surgeries. No erythema, tenderness of his lower extremities.   PAST MEDICAL HISTORY: 1. Atrial fibrillation and sick sinus syndrome, status post pacemaker.  2. Hypertension.  3. Benign prostatic hypertrophy.  4. Chronic lower extremity edema.  5. Gastroesophageal disease.  6. Recurrent syncope.   PAST SURGICAL HISTORY:  1.  Pacemaker placement.  2. Hemorrhoidectomy.   ALLERGIES TO MEDICATIONS : PENICILLIN.   CURRENT HOME MEDICATIONS:  1. Amiodarone 100 mg p.o. daily.  2. Aspirin 325 mg p.o. daily.  3. Klor-Con 10 mEq  p.o. daily.  4. Metoprolol 100 mg p.o. b.i.d.  5. Torsemide 10 mg p.o. daily.  6. Vitamin B12 at 1000 mcg p.o. daily.   SOCIAL HISTORY: The patient lives at home with his wife. They also have a dog. No smoking or alcohol use.   FAMILY HISTORY: Father died from leukemia and one of his brothers had lung cancer.   REVIEW OF SYSTEMS:  CONSTITUTIONAL: No fever, fatigue, or weakness.  EYES: No blurred vision, double vision, inflammation, or glaucoma.  EARS, NOSE, AND THROAT: No tinnitus, ear pain, hearing loss, epistaxis, or discharge.  RESPIRATORY: No cough, wheeze, hemoptysis, positive for painful respiration, but no COPD.  CARDIOVASCULAR: Positive for chest pain. No orthopnea, edema, arrhythmia, palpitations, or syncope.  GASTROINTESTINAL: No nausea, vomiting, diarrhea, abdominal pain, hematemesis, or melena.  GENITOURINARY: No dysuria, hematuria, renal calculus,  frequency, or incontinence.  ENDOCRINE: No polyuria, nocturia, thyroid problems, heat or cold intolerance. HEMATOLOGY: No anemia, easy bruising or bleeding.  SKIN: No acne, rash, or lesions.  MUSCULOSKELETAL: No neck, back, shoulder pain, arthritis or gout.  NEUROLOGIC: No numbness, weakness, CVA, TIA, or seizures.  PSYCHOLOGICAL: No anxiety, insomnia, or depression.   PHYSICAL EXAMINATION: VITAL SIGNS: Temperature 97.4 degrees Fahrenheit,  pulse 60, respirations 18, blood pressure 141/65, pulse of 98% on room air.  GENERAL: Well-built, well-nourished male, lying in bed, not in any acute distress.   HEENT: Normocephalic, atraumatic.  Pupils equal, round, reacting to light. Anicteric sclerae. Extraocular movements intact. Oropharynx clear without erythema, mass, or exudates.  NECK: Supple. No thyromegaly, JVD or carotid bruits. No  lymphadenopathy.  LUNGS: Moving air bilaterally. Decreased bibasilar breath sounds. No wheeze. No crackles. No use of accessory muscles for breathing.  CARDIOVASCULAR: S1, S2, normal rate and regular rhythm. No murmurs, rubs, or gallops.  ABDOMEN: Soft, nontender, nondistended. No hepatosplenomegaly. Normal bowel sounds.  EXTREMITIES: He has 1+ pedal edema. The left leg in the calf region is more swollen than the right one, but there is no tenderness. There is no erythema noted. He states since his cellulitis, the left leg has been slightly swollen. No clubbing or cyanosis.  LYMPHATICS: No cervical lymphadenopathy.  NEUROLOGIC: Cranial nerves intact. No focal motor or sensory deficits.  PSYCHOLOGICAL: The patient is awake, alert, oriented x 3.   LABORATORY DATA: WBC 6.7, hemoglobin 12.4, hematocrit 37.2, platelet count 194,000. Sodium 141, potassium 3.4, chloride 104, bicarbonate 29, BUN 16, creatinine 1.4, glucose 95, and calcium of 8.8. D-dimer is elevated at 1166. Chest x-ray shows small bilateral pleural fluid and emphysematous changes. INR is 1.1, PTT 32.1. Troponin is negative. CT of the chest with contrast showing bilateral pulmonary embolism, largest clot burden in right middle lobe or pulmonary artery, no evidence of any right heart strain, significant consolidative changes right lower lobe, could be an area of infarction versus consolidation from infection. EKG showing normal sinus rhythm, heart rate of 60. No acute ST or T-wave abnormalities.   ASSESSMENT AND PLAN: A 69 year old male with atrial fibrillation, not an anticoagulation pacemaker, hypertension, gastroesophageal reflux disease, presents with right-sided pleuritic chest pain, noted to have pulmonary embolism.  1. Acute bilateral pulmonary embolism. Admitted and started on IV heparin with significant pleuritic chest pain and possible pulmonary infarct. Can be discharged on p.o. anticoagulation likely tomorrow. Continue oxygen support  as needed. Dopplers of lower extremities. Hypercoagulable work-up has been ordered.  2. Hypertension. Continue metoprolol.  3. Atrial fibrillation, status post pacemaker. Hold aspirin now that he is on heparin drip. Xarelto and oral liquids can be started tomorrow and can be discharged on the same. The patient is rate controlled on amiodarone and metoprolol.  4. Hyperkalemia. Being replaced.  5. Deep vein thrombosis prophylaxis on heparin drip.   CODE STATUS: Full code.   TIME SPENT ON ADMISSION: 50 minutes.     ____________________________ Enid Baasadhika Airen Stiehl, MD rk:mw D: 12/13/2014 14:13:42 ET T: 12/13/2014 15:52:22 ET JOB#: [id number]  cc: Enid Baasadhika Mickie Badders, MD, <Dictator> Marya AmslerMarshall W. Dareen PianoAnderson, MD Enid BaasADHIKA Tunisha Ruland MD ELECTRONICALLY SIGNED 12/26/2014 14:45

## 2015-01-04 NOTE — Discharge Summary (Signed)
PATIENT NAME:  Edward [name], Edward [name] MR#:  [id number] DATE OF BIRTH:  09/30/1945  HISTORY OF PRESENT ILLNESS:  For detailed note, please see the history and physical done on admission by Dr. Nemiah Commander.    DIAGNOSES AT DISCHARGE: Acute pulmonary embolism; chest pain secondary to the pulmonary embolism; hemorrhoids, external; hypertension; history of chronic atrial fibrillation;  hypokalemia.   DISPOSITION:  The patient is being discharged home with home health nursing and social work services.   ACTIVITY:  As tolerated.   FOLLOWUP:   Dr. Einar Crow in the next 1-2 weeks.     DISCHARGE MEDICATIONS:  Amiodarone 200 mg half a tablet daily; potassium 10 mEq daily; torsemide 10 mg daily; metoprolol tartrate 100 mg b.i.d.; vitamin B12 1000 mcg daily; apixaban 10 mg b.i.d. x7 days, then to start 5 mg b.i.d.; Anusol topical cream to be applied t.i.d.; MiraLax daily as needed.   CONSULTANTS DURING THE HOSPITAL COURSE:  Dr. Dionne Milo from general surgery.    PERTINENT STUDIES DONE DURING THE HOSPITAL COURSE:  A CT scan of the chest done with contrast showing positive bilateral pulmonary embolism with largest clot burden seen within the right middle lobar pulmonary artery.  Overall clot burden is deemed small in volume. No CT evidence of right heart strain.  A Doppler of the lower extremities showing no evidence of any further DVT.  Chest x-ray done on admission showing emphysematous changes.  A 2-dimensional echocardiogram showing ejection fraction of 50%-55%, normal global LV systolic function, mild mitral valve regurgitation, and mild tricuspid regurgitation.   HOSPITAL COURSE:  This is a 69 year old male with medical problems as mentioned above, presented to the hospital with chest pain, noted to have an acute pulmonary embolus.   PROBLEMS: 1. Acute pulmonary embolus.  This was likely the cause of patient's pleuritic chest pain. The patient was admitted to this hospital and started on IV  heparin.  Maintained on that for about 2-3 days.  The patient has a history of hemorrhoidal bleeding, therefore was maintained on heparin for a few days, and he did continue to have some mild bleeding with his hemorrhoids but his hemoglobin remains stable.  He was eventually switched over to oral Eliquis and is being discharged on that.  He will have close followup with his primary care physician as an outpatient.  The exact etiology of why he had a PE is still unclear.  His hypercoagulable workup is still pending.  His Dopplers of his lower extremities were negative.  2. Hemorrhoidal bleeding.  The patient had some external hemorrhoids which he has had for a while.  I obtained a surgical consult to see if they could be clipped, although surgery did not think that it needed surgical intervention.  Therefore, the patient was started on Anusol suppositories with laxatives.  He still continues to have some mild intermittent bleeding but his hemoglobin has been stable despite being on anticoagulants.  At this point, he is being discharged home on Anusol rectal cream and GI followup as an outpatient.  If he were to have further significant bleeding from his hemorrhoids, these likely need to be surgically clipped as he needs to be on long-term anticoagulation.   3. Hypertension.  The patient remained hemodynamically stable.  He will continue his metoprolol.   4. Chronic atrial fibrillation. The patient is rate controlled.  He was maintained on his amiodarone and metoprolol which he will continue.  He is being discharged on oral Eliquis for long-term anticoagulation.  5. Hypokalemia.  This has since then been supplemented and resolved.    CODE STATUS:  The patient is a full code.   TIME SPENT:  Forty minutes.    ____________________________ Rolly PancakeVivek J. Cherlynn KaiserSainani, MD vjs:kc D: 12/16/2014 15:58:33 ET T: 12/16/2014 16:21:08 ET JOB#: [id number]  cc: Rolly PancakeVivek J. Cherlynn KaiserSainani, MD, <Dictator> Marya AmslerMarshall W. Dareen PianoAnderson, MD Houston SirenVIVEK J  SAINANI MD ELECTRONICALLY SIGNED 12/24/2014 11:28

## 2015-01-06 ENCOUNTER — Other Ambulatory Visit: Payer: Self-pay | Admitting: *Deleted

## 2015-01-06 NOTE — Patient Outreach (Signed)
[facility] Care Management  01/06/2015  Edward [name] 07-Jan-1946 [id number]   Phone call to patient to follow up on upcoming move.  Per patient and his wife,  they have had several leads but nothing is final at this time.  Per patient and spouse, they have their friends from church continuing to  assist with apartment resources as well.  [facility].  Edward [name], [phone] silverback care manager updated on current resources provided.     Edward ReamsChrystal Eydie Wormley, LCSW [facility] Care Management [phone]    .

## 2015-01-07 DIAGNOSIS — D649 Anemia, unspecified: Secondary | ICD-10-CM | POA: Diagnosis not present

## 2015-01-07 DIAGNOSIS — I2699 Other pulmonary embolism without acute cor pulmonale: Secondary | ICD-10-CM | POA: Diagnosis not present

## 2015-01-07 DIAGNOSIS — Z7901 Long term (current) use of anticoagulants: Secondary | ICD-10-CM | POA: Diagnosis not present

## 2015-01-07 DIAGNOSIS — I495 Sick sinus syndrome: Secondary | ICD-10-CM | POA: Diagnosis not present

## 2015-01-07 DIAGNOSIS — I4891 Unspecified atrial fibrillation: Secondary | ICD-10-CM | POA: Diagnosis not present

## 2015-01-07 DIAGNOSIS — I10 Essential (primary) hypertension: Secondary | ICD-10-CM | POA: Diagnosis not present

## 2015-01-07 DIAGNOSIS — Z950 Presence of cardiac pacemaker: Secondary | ICD-10-CM | POA: Diagnosis not present

## 2015-01-09 ENCOUNTER — Other Ambulatory Visit: Payer: Self-pay | Admitting: *Deleted

## 2015-01-09 NOTE — Patient Outreach (Signed)
[facility] Care Management  01/09/2015  Fransico Setterslbert C Holstine 06-20-1946 [id number]   Phone call to patient to provide another housing resource for housing.  Spoke with [employer] [phone] who said that they may possibly have an opening on June 1.  Encouraged patient to call to discuss specific details to apply.  Per patient, he has a application that he has not yet submitted yet for [address] apartments. Per patient's spouse, they have to wait for transportation availability to get there.   Adriana ReamsChrystal Land, LCSW [facility] Care Management [phone]

## 2015-01-14 DIAGNOSIS — I10 Essential (primary) hypertension: Secondary | ICD-10-CM | POA: Diagnosis not present

## 2015-01-14 DIAGNOSIS — I4891 Unspecified atrial fibrillation: Secondary | ICD-10-CM | POA: Diagnosis not present

## 2015-01-14 DIAGNOSIS — I2699 Other pulmonary embolism without acute cor pulmonale: Secondary | ICD-10-CM | POA: Diagnosis not present

## 2015-01-14 DIAGNOSIS — I4892 Unspecified atrial flutter: Secondary | ICD-10-CM | POA: Diagnosis not present

## 2015-01-15 DIAGNOSIS — I4891 Unspecified atrial fibrillation: Secondary | ICD-10-CM | POA: Diagnosis not present

## 2015-01-15 DIAGNOSIS — I4892 Unspecified atrial flutter: Secondary | ICD-10-CM | POA: Diagnosis not present

## 2015-01-16 DIAGNOSIS — I2699 Other pulmonary embolism without acute cor pulmonale: Secondary | ICD-10-CM | POA: Diagnosis not present

## 2015-01-16 DIAGNOSIS — I10 Essential (primary) hypertension: Secondary | ICD-10-CM | POA: Diagnosis not present

## 2015-01-16 DIAGNOSIS — I495 Sick sinus syndrome: Secondary | ICD-10-CM | POA: Diagnosis not present

## 2015-01-16 DIAGNOSIS — I4891 Unspecified atrial fibrillation: Secondary | ICD-10-CM | POA: Diagnosis not present

## 2015-01-22 ENCOUNTER — Encounter: Payer: Self-pay | Admitting: *Deleted

## 2015-01-22 ENCOUNTER — Other Ambulatory Visit: Payer: Self-pay | Admitting: *Deleted

## 2015-01-22 NOTE — Patient Outreach (Signed)
[facility]) Care Management  01/22/2015  MALAQUIAS LENKER 1946-05-04 [id number]  Phone call to patient to follow up on housing issue.  Per patient, they have found a new apartment([address]) and are in the process of moving to be out of their current apartment by 02/02/15.  Per patient, they have found a new home for their dog and church members are assisting with the transitions.  Patient's case to be closed to social work, due to goal being met of finding a new residence.   Sheralyn Boatman [facility] Care Management [phone]

## 2015-01-23 DIAGNOSIS — I2699 Other pulmonary embolism without acute cor pulmonale: Secondary | ICD-10-CM | POA: Diagnosis not present

## 2015-01-23 DIAGNOSIS — I4891 Unspecified atrial fibrillation: Secondary | ICD-10-CM | POA: Diagnosis not present

## 2015-01-23 DIAGNOSIS — I495 Sick sinus syndrome: Secondary | ICD-10-CM | POA: Diagnosis not present

## 2015-01-23 DIAGNOSIS — Z950 Presence of cardiac pacemaker: Secondary | ICD-10-CM | POA: Diagnosis not present

## 2015-01-23 DIAGNOSIS — I10 Essential (primary) hypertension: Secondary | ICD-10-CM | POA: Diagnosis not present

## 2015-01-23 DIAGNOSIS — D649 Anemia, unspecified: Secondary | ICD-10-CM | POA: Diagnosis not present

## 2015-01-23 DIAGNOSIS — Z7901 Long term (current) use of anticoagulants: Secondary | ICD-10-CM | POA: Diagnosis not present

## 2015-01-30 DIAGNOSIS — Z950 Presence of cardiac pacemaker: Secondary | ICD-10-CM | POA: Diagnosis not present

## 2015-01-30 DIAGNOSIS — I495 Sick sinus syndrome: Secondary | ICD-10-CM | POA: Diagnosis not present

## 2015-01-30 DIAGNOSIS — I4891 Unspecified atrial fibrillation: Secondary | ICD-10-CM | POA: Diagnosis not present

## 2015-01-30 DIAGNOSIS — L03119 Cellulitis of unspecified part of limb: Secondary | ICD-10-CM | POA: Diagnosis not present

## 2015-01-30 DIAGNOSIS — Z7901 Long term (current) use of anticoagulants: Secondary | ICD-10-CM | POA: Diagnosis not present

## 2015-01-30 DIAGNOSIS — D649 Anemia, unspecified: Secondary | ICD-10-CM | POA: Diagnosis not present

## 2015-01-30 DIAGNOSIS — I10 Essential (primary) hypertension: Secondary | ICD-10-CM | POA: Diagnosis not present

## 2015-01-30 DIAGNOSIS — I2699 Other pulmonary embolism without acute cor pulmonale: Secondary | ICD-10-CM | POA: Diagnosis not present

## 2015-04-01 DIAGNOSIS — I4891 Unspecified atrial fibrillation: Secondary | ICD-10-CM | POA: Diagnosis not present

## 2015-04-01 DIAGNOSIS — I5032 Chronic diastolic (congestive) heart failure: Secondary | ICD-10-CM | POA: Diagnosis not present

## 2015-04-01 DIAGNOSIS — I4892 Unspecified atrial flutter: Secondary | ICD-10-CM | POA: Diagnosis not present

## 2015-04-01 DIAGNOSIS — Z0000 Encounter for general adult medical examination without abnormal findings: Secondary | ICD-10-CM | POA: Diagnosis not present

## 2015-04-01 DIAGNOSIS — E039 Hypothyroidism, unspecified: Secondary | ICD-10-CM | POA: Diagnosis not present

## 2015-04-29 DIAGNOSIS — I4891 Unspecified atrial fibrillation: Secondary | ICD-10-CM | POA: Diagnosis not present

## 2015-04-29 DIAGNOSIS — I10 Essential (primary) hypertension: Secondary | ICD-10-CM | POA: Diagnosis not present

## 2015-04-29 DIAGNOSIS — I519 Heart disease, unspecified: Secondary | ICD-10-CM | POA: Diagnosis not present

## 2015-04-29 DIAGNOSIS — I5032 Chronic diastolic (congestive) heart failure: Secondary | ICD-10-CM | POA: Diagnosis not present

## 2015-07-01 ENCOUNTER — Other Ambulatory Visit: Payer: Self-pay | Admitting: Family Medicine

## 2015-07-29 DIAGNOSIS — I5022 Chronic systolic (congestive) heart failure: Secondary | ICD-10-CM | POA: Diagnosis not present

## 2015-07-29 DIAGNOSIS — I4892 Unspecified atrial flutter: Secondary | ICD-10-CM | POA: Diagnosis not present

## 2015-07-29 DIAGNOSIS — I4891 Unspecified atrial fibrillation: Secondary | ICD-10-CM | POA: Diagnosis not present

## 2015-07-29 DIAGNOSIS — E039 Hypothyroidism, unspecified: Secondary | ICD-10-CM | POA: Diagnosis not present

## 2015-07-29 DIAGNOSIS — Z23 Encounter for immunization: Secondary | ICD-10-CM | POA: Diagnosis not present

## 2015-08-04 DIAGNOSIS — I4891 Unspecified atrial fibrillation: Secondary | ICD-10-CM | POA: Diagnosis not present

## 2015-08-04 DIAGNOSIS — I4892 Unspecified atrial flutter: Secondary | ICD-10-CM | POA: Diagnosis not present

## 2015-10-28 DIAGNOSIS — I5032 Chronic diastolic (congestive) heart failure: Secondary | ICD-10-CM | POA: Diagnosis not present

## 2015-10-28 DIAGNOSIS — Z0000 Encounter for general adult medical examination without abnormal findings: Secondary | ICD-10-CM | POA: Diagnosis not present

## 2015-10-28 DIAGNOSIS — I495 Sick sinus syndrome: Secondary | ICD-10-CM | POA: Diagnosis not present

## 2015-10-28 DIAGNOSIS — I519 Heart disease, unspecified: Secondary | ICD-10-CM | POA: Diagnosis not present

## 2015-10-28 DIAGNOSIS — I10 Essential (primary) hypertension: Secondary | ICD-10-CM | POA: Diagnosis not present

## 2015-10-28 DIAGNOSIS — Z950 Presence of cardiac pacemaker: Secondary | ICD-10-CM | POA: Diagnosis not present

## 2015-10-28 DIAGNOSIS — R001 Bradycardia, unspecified: Secondary | ICD-10-CM | POA: Diagnosis not present

## 2015-10-28 DIAGNOSIS — I4892 Unspecified atrial flutter: Secondary | ICD-10-CM | POA: Diagnosis not present

## 2015-10-28 DIAGNOSIS — I5022 Chronic systolic (congestive) heart failure: Secondary | ICD-10-CM | POA: Diagnosis not present

## 2015-10-28 DIAGNOSIS — I4891 Unspecified atrial fibrillation: Secondary | ICD-10-CM | POA: Diagnosis not present

## 2015-11-13 IMAGING — RF DG FLUORO GUIDE LUMBAR PUNCTURE
1 series · 1 of 1 positions shown · non-contrast
Comparison: none

CLINICAL DATA: Fever.  Sepsis.  Altered mental status.

[Series 1: run · 1 of 1 slices shown]
[im 1/1]
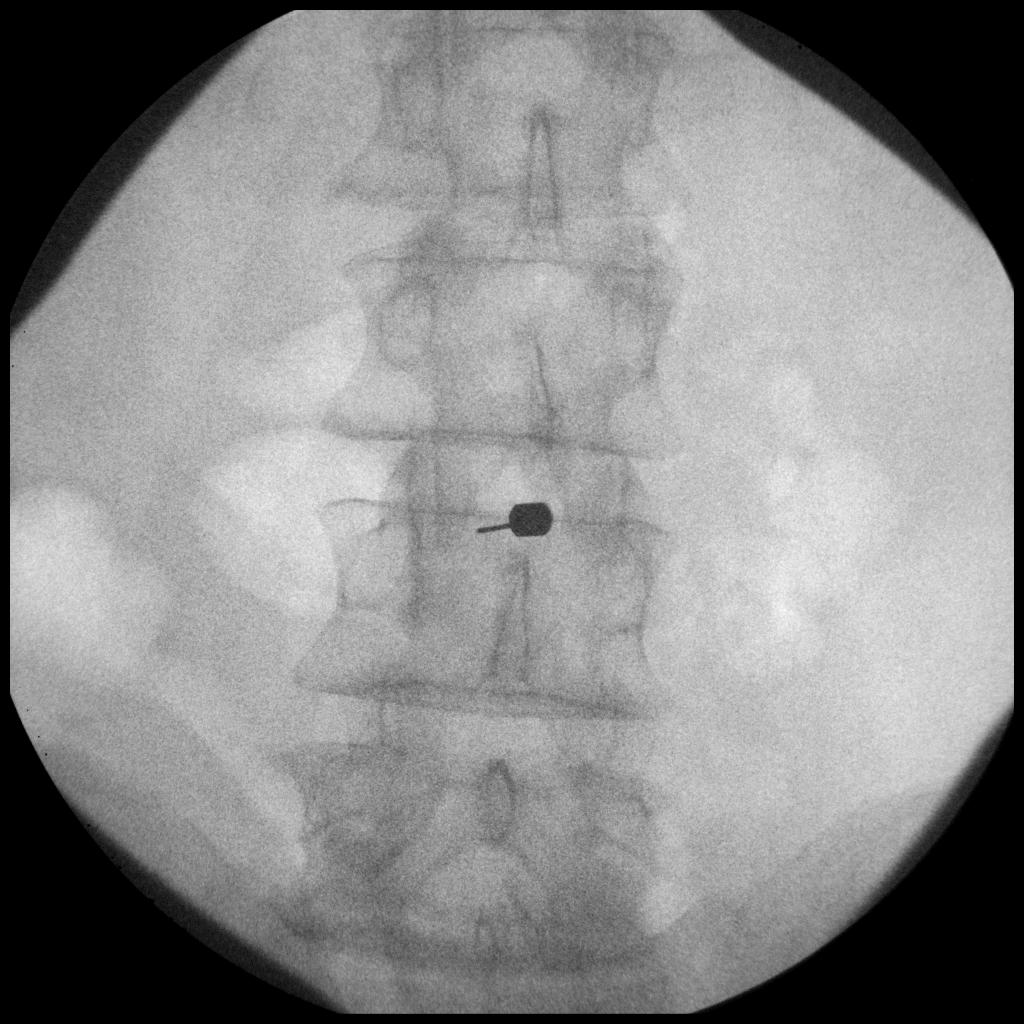

[1 of 1 positions shown; findings below may reference images not displayed]

EXAM:
DIAGNOSTIC LUMBAR PUNCTURE UNDER FLUOROSCOPIC GUIDANCE

FLUOROSCOPY TIME:  0 min 43 seconds

PROCEDURE:
Informed consent was obtained from the patient prior to the
procedure, including potential complications of headache, allergy,
and pain. With the patient prone, the lower back was prepped with
Betadine. 1% Lidocaine was used for local anesthesia. Lumbar
puncture was performed at the L3-4 level using a 20 gauge needle
with return of clear CSF with an opening pressure of 16 cm water.
10ml of CSF were obtained for laboratory studies. The patient
tolerated the procedure well and there were no apparent
complications.
IMPRESSION: Successful diagnostic lumbar puncture performed under fluoroscopic
guidance. No evidence immediate complication.

## 2015-11-13 IMAGING — CR DG CHEST 2V
2 series · 2 of 2 positions shown · non-contrast
Comparison: PA and lateral chest of July 16, 2004

CLINICAL DATA: Altered mental status and fever for 2 days

EXAM:
CHEST  2 VIEW

[w chest lat]
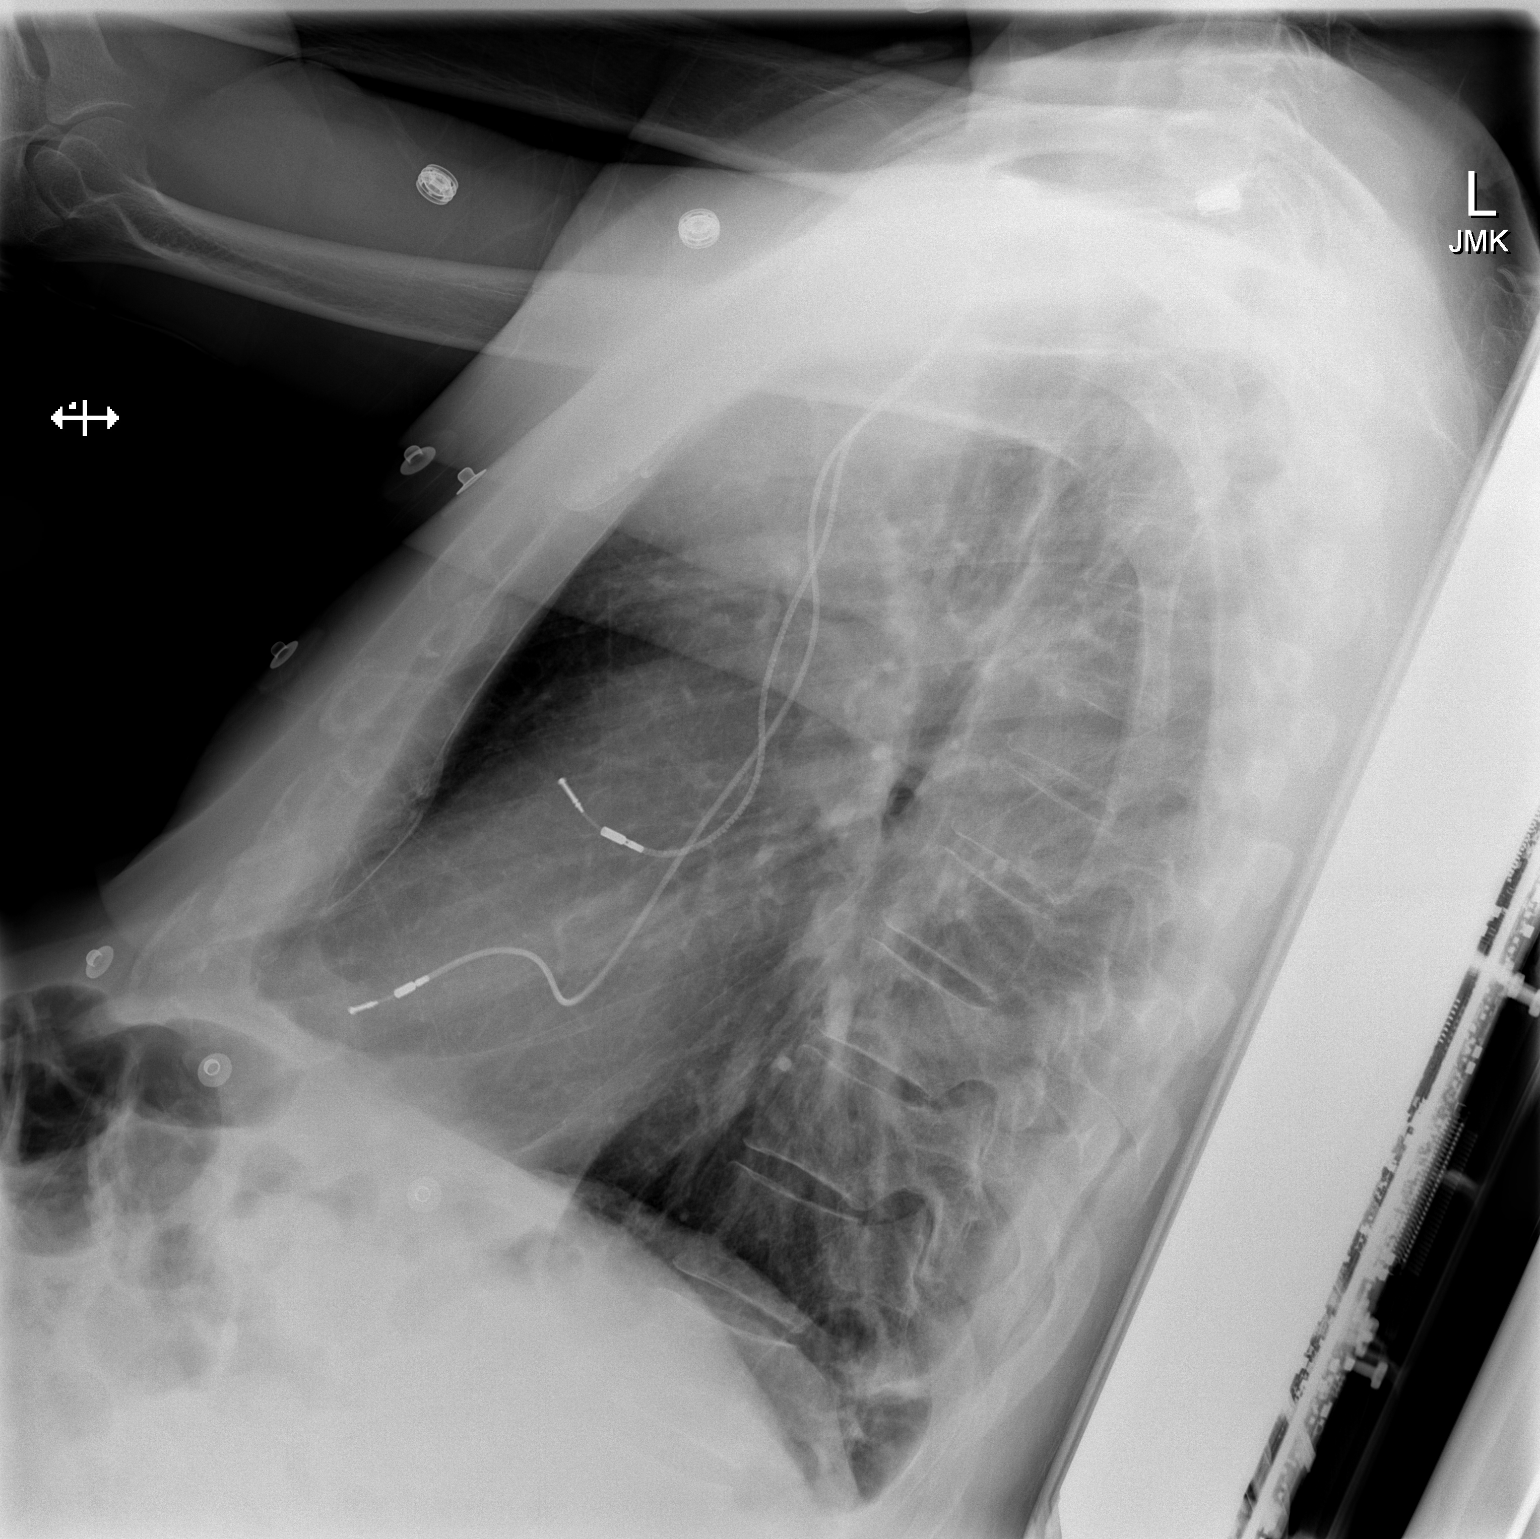

[x chest ap]
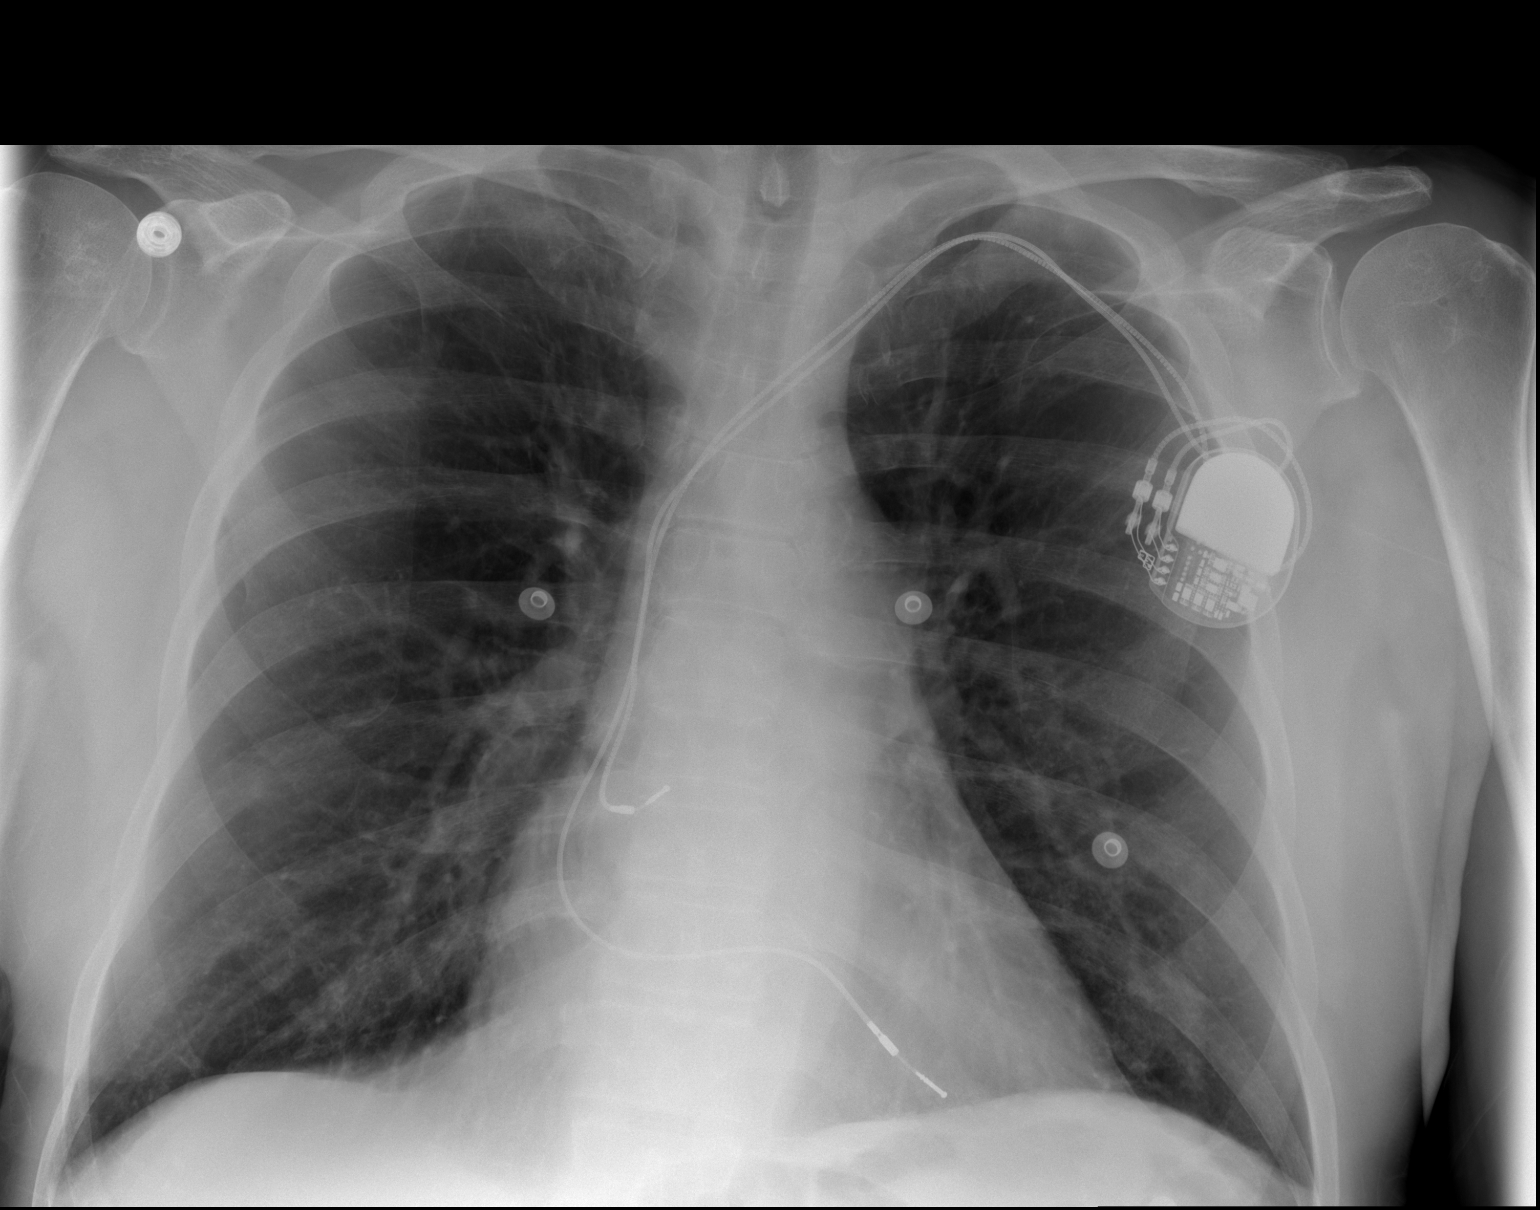

[2 of 2 positions shown; findings below may reference images not displayed]

FINDINGS: The lungs are mildly hyperinflated but clear. The heart and
pulmonary vascularity are normal. The permanent pacemaker is in
appropriate position radiographically. There is no pleural effusion
or pneumothorax. The observed bony thorax is unremarkable.
IMPRESSION: Hyperinflation consistent with reactive airway disease or COPD.
There is no evidence of pneumonia, CHF, or other acute
cardiopulmonary abnormality.

## 2016-02-11 DIAGNOSIS — I4891 Unspecified atrial fibrillation: Secondary | ICD-10-CM | POA: Diagnosis not present

## 2016-02-11 DIAGNOSIS — I4892 Unspecified atrial flutter: Secondary | ICD-10-CM | POA: Diagnosis not present

## 2016-04-11 DIAGNOSIS — I5022 Chronic systolic (congestive) heart failure: Secondary | ICD-10-CM | POA: Diagnosis not present

## 2016-04-19 IMAGING — CR DG CHEST 2V
1 series · 2 of 2 positions shown · non-contrast
Comparison: 08/11/2014

CLINICAL DATA: Right-sided chest pain beginning last night.
Pleuritic pain.

EXAM:
CHEST  2 VIEW

[Series 1: dxr chest pa (or ap) and lateral · 0.14mm/px · 2 of 2 slices shown]
[im 1/2]
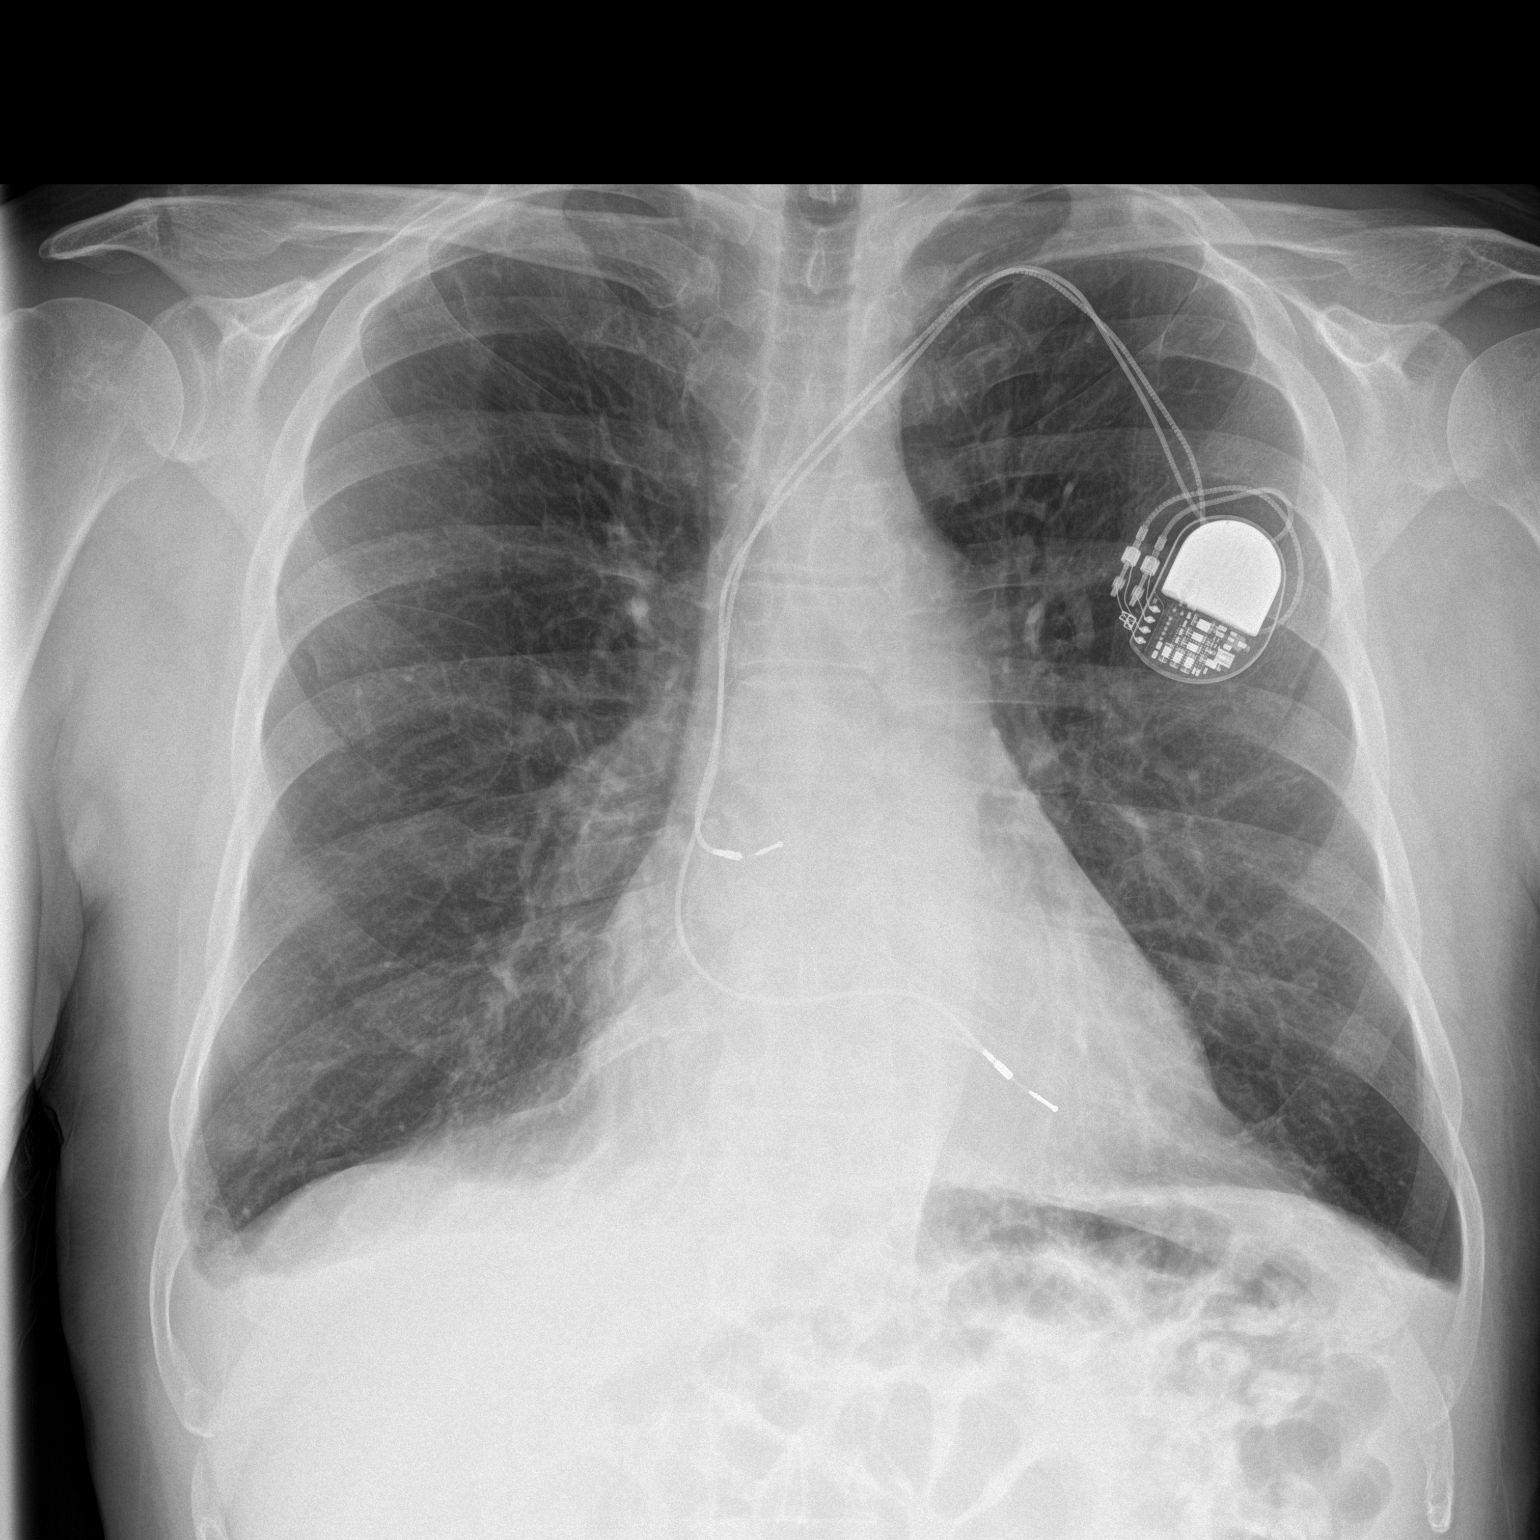
[im 2/2]
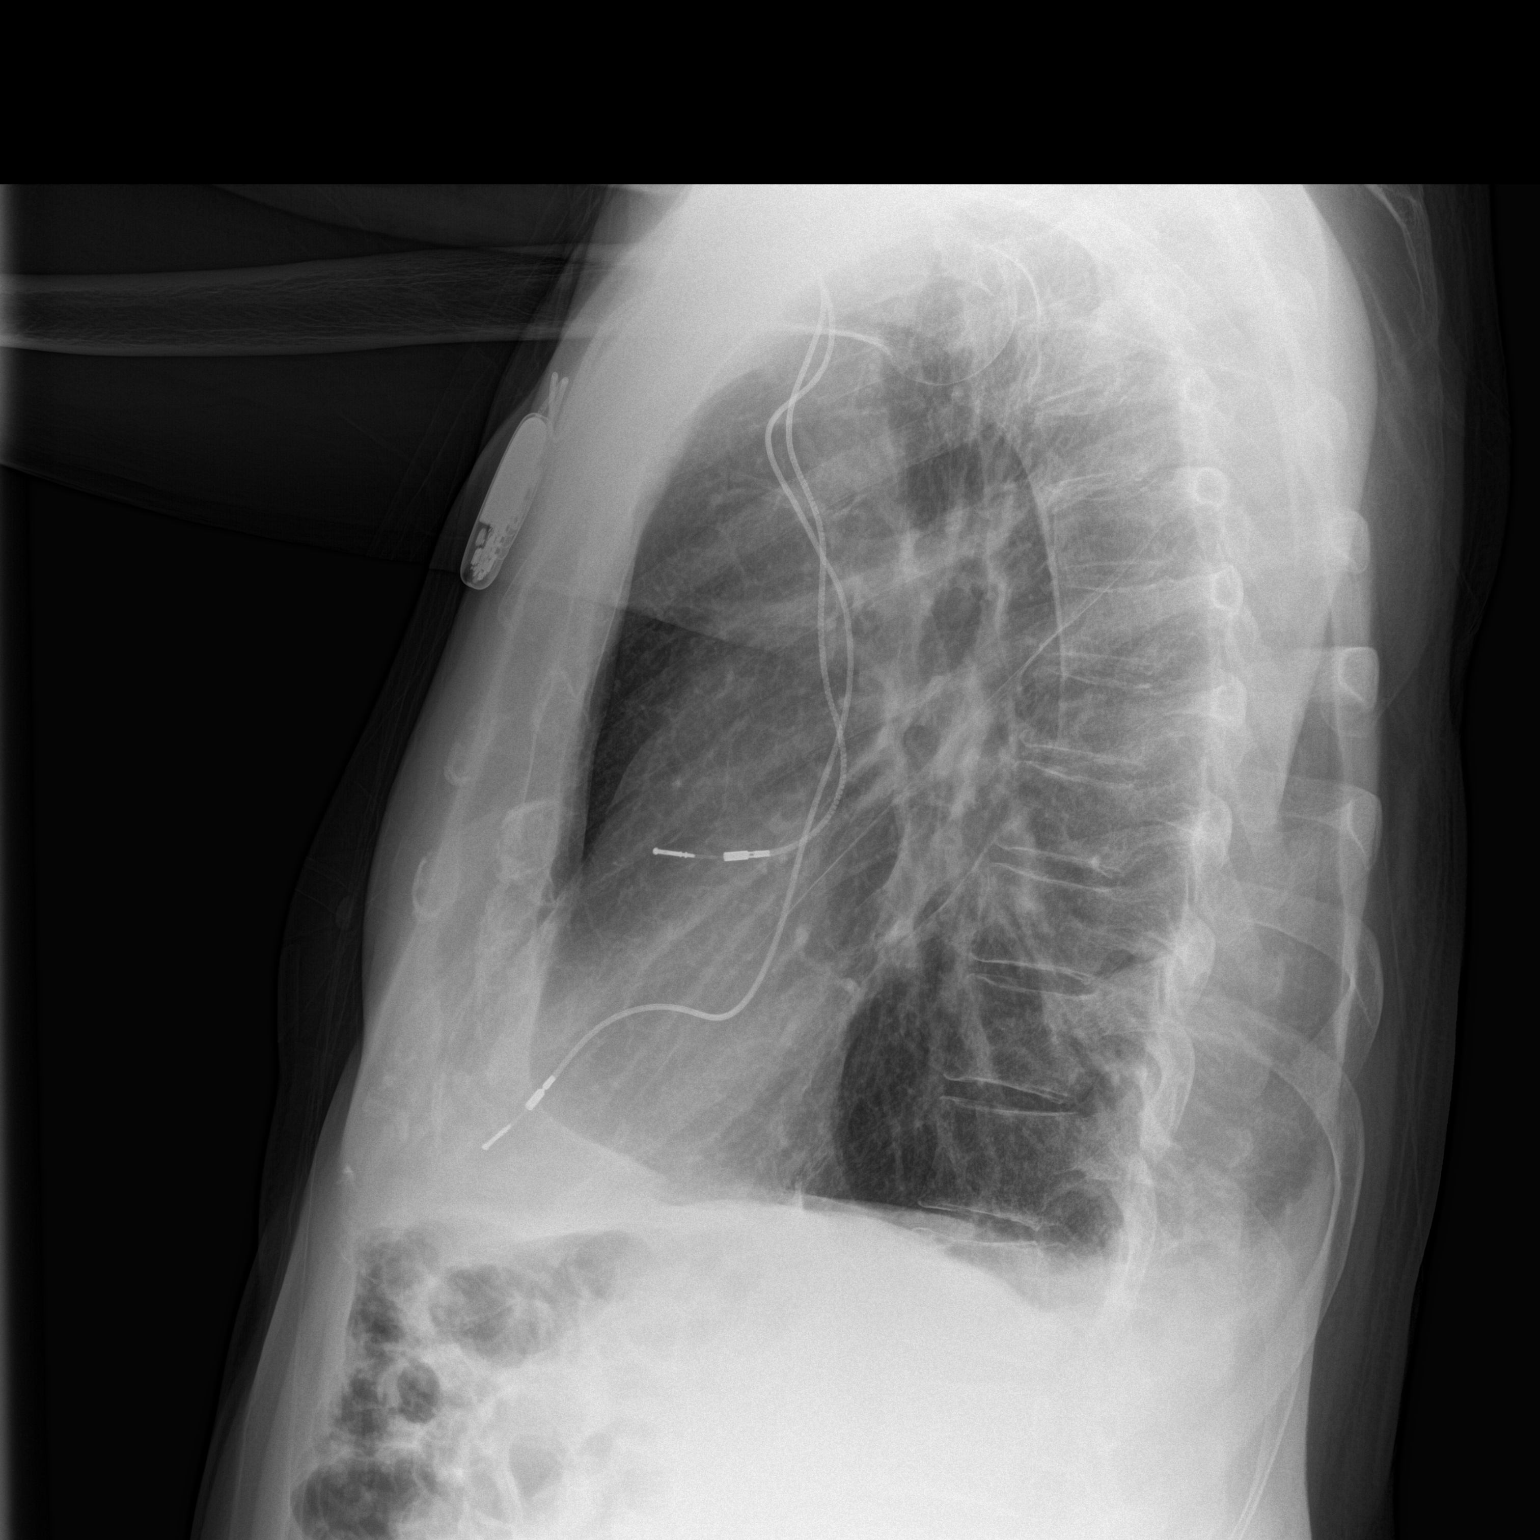

[2 of 2 positions shown; findings below may reference images not displayed]

FINDINGS: Left-sided pacemaker unchanged. Lungs are adequately inflated with
mild blunting of the costophrenic angles bilaterally suggesting a
small amount of bilateral pleural fluid. No focal airspace process.
Flattening of the hemidiaphragms. Cardiomediastinal silhouette and
remainder of the exam is unchanged.
IMPRESSION: Findings suggesting a small amount of bilateral pleural fluid.

Emphysematous disease.

## 2016-04-27 DIAGNOSIS — I4891 Unspecified atrial fibrillation: Secondary | ICD-10-CM | POA: Diagnosis not present

## 2016-04-27 DIAGNOSIS — I10 Essential (primary) hypertension: Secondary | ICD-10-CM | POA: Diagnosis not present

## 2016-04-27 DIAGNOSIS — I5022 Chronic systolic (congestive) heart failure: Secondary | ICD-10-CM | POA: Diagnosis not present

## 2016-04-27 DIAGNOSIS — I4892 Unspecified atrial flutter: Secondary | ICD-10-CM | POA: Diagnosis not present

## 2016-04-27 DIAGNOSIS — F0280 Dementia in other diseases classified elsewhere without behavioral disturbance: Secondary | ICD-10-CM | POA: Diagnosis not present

## 2016-04-28 DIAGNOSIS — Z950 Presence of cardiac pacemaker: Secondary | ICD-10-CM | POA: Diagnosis not present

## 2016-04-28 DIAGNOSIS — I519 Heart disease, unspecified: Secondary | ICD-10-CM | POA: Diagnosis not present

## 2016-04-28 DIAGNOSIS — I4891 Unspecified atrial fibrillation: Secondary | ICD-10-CM | POA: Diagnosis not present

## 2016-04-28 DIAGNOSIS — I5022 Chronic systolic (congestive) heart failure: Secondary | ICD-10-CM | POA: Diagnosis not present

## 2016-04-28 DIAGNOSIS — I4892 Unspecified atrial flutter: Secondary | ICD-10-CM | POA: Diagnosis not present

## 2016-04-28 DIAGNOSIS — I10 Essential (primary) hypertension: Secondary | ICD-10-CM | POA: Diagnosis not present

## 2016-04-28 DIAGNOSIS — R001 Bradycardia, unspecified: Secondary | ICD-10-CM | POA: Diagnosis not present

## 2016-04-28 DIAGNOSIS — I495 Sick sinus syndrome: Secondary | ICD-10-CM | POA: Diagnosis not present

## 2016-05-03 DIAGNOSIS — N400 Enlarged prostate without lower urinary tract symptoms: Secondary | ICD-10-CM | POA: Diagnosis not present

## 2016-05-03 DIAGNOSIS — L97221 Non-pressure chronic ulcer of left calf limited to breakdown of skin: Secondary | ICD-10-CM | POA: Diagnosis not present

## 2016-05-03 DIAGNOSIS — E039 Hypothyroidism, unspecified: Secondary | ICD-10-CM | POA: Diagnosis not present

## 2016-05-03 DIAGNOSIS — G9341 Metabolic encephalopathy: Secondary | ICD-10-CM | POA: Diagnosis not present

## 2016-05-03 DIAGNOSIS — I5022 Chronic systolic (congestive) heart failure: Secondary | ICD-10-CM | POA: Diagnosis not present

## 2016-05-03 DIAGNOSIS — I4891 Unspecified atrial fibrillation: Secondary | ICD-10-CM | POA: Diagnosis not present

## 2016-05-03 DIAGNOSIS — I110 Hypertensive heart disease with heart failure: Secondary | ICD-10-CM | POA: Diagnosis not present

## 2016-05-03 DIAGNOSIS — I872 Venous insufficiency (chronic) (peripheral): Secondary | ICD-10-CM | POA: Diagnosis not present

## 2016-05-03 DIAGNOSIS — R001 Bradycardia, unspecified: Secondary | ICD-10-CM | POA: Diagnosis not present

## 2016-05-04 DIAGNOSIS — I5022 Chronic systolic (congestive) heart failure: Secondary | ICD-10-CM | POA: Diagnosis not present

## 2016-05-04 DIAGNOSIS — I4891 Unspecified atrial fibrillation: Secondary | ICD-10-CM | POA: Diagnosis not present

## 2016-05-04 DIAGNOSIS — E039 Hypothyroidism, unspecified: Secondary | ICD-10-CM | POA: Diagnosis not present

## 2016-05-04 DIAGNOSIS — N400 Enlarged prostate without lower urinary tract symptoms: Secondary | ICD-10-CM | POA: Diagnosis not present

## 2016-05-04 DIAGNOSIS — I872 Venous insufficiency (chronic) (peripheral): Secondary | ICD-10-CM | POA: Diagnosis not present

## 2016-05-04 DIAGNOSIS — I110 Hypertensive heart disease with heart failure: Secondary | ICD-10-CM | POA: Diagnosis not present

## 2016-05-04 DIAGNOSIS — G9341 Metabolic encephalopathy: Secondary | ICD-10-CM | POA: Diagnosis not present

## 2016-05-04 DIAGNOSIS — L97221 Non-pressure chronic ulcer of left calf limited to breakdown of skin: Secondary | ICD-10-CM | POA: Diagnosis not present

## 2016-05-04 DIAGNOSIS — R001 Bradycardia, unspecified: Secondary | ICD-10-CM | POA: Diagnosis not present

## 2016-05-05 DIAGNOSIS — I10 Essential (primary) hypertension: Secondary | ICD-10-CM | POA: Diagnosis not present

## 2016-05-05 DIAGNOSIS — E039 Hypothyroidism, unspecified: Secondary | ICD-10-CM | POA: Diagnosis not present

## 2016-05-05 DIAGNOSIS — I4892 Unspecified atrial flutter: Secondary | ICD-10-CM | POA: Diagnosis not present

## 2016-05-05 DIAGNOSIS — I5022 Chronic systolic (congestive) heart failure: Secondary | ICD-10-CM | POA: Diagnosis not present

## 2016-05-05 DIAGNOSIS — R4189 Other symptoms and signs involving cognitive functions and awareness: Secondary | ICD-10-CM | POA: Insufficient documentation

## 2016-05-05 DIAGNOSIS — I4891 Unspecified atrial fibrillation: Secondary | ICD-10-CM | POA: Diagnosis not present

## 2016-05-11 DIAGNOSIS — I872 Venous insufficiency (chronic) (peripheral): Secondary | ICD-10-CM | POA: Diagnosis not present

## 2016-05-11 DIAGNOSIS — I110 Hypertensive heart disease with heart failure: Secondary | ICD-10-CM | POA: Diagnosis not present

## 2016-05-11 DIAGNOSIS — G9341 Metabolic encephalopathy: Secondary | ICD-10-CM | POA: Diagnosis not present

## 2016-05-11 DIAGNOSIS — I4891 Unspecified atrial fibrillation: Secondary | ICD-10-CM | POA: Diagnosis not present

## 2016-05-11 DIAGNOSIS — E039 Hypothyroidism, unspecified: Secondary | ICD-10-CM | POA: Diagnosis not present

## 2016-05-11 DIAGNOSIS — L97221 Non-pressure chronic ulcer of left calf limited to breakdown of skin: Secondary | ICD-10-CM | POA: Diagnosis not present

## 2016-05-11 DIAGNOSIS — N400 Enlarged prostate without lower urinary tract symptoms: Secondary | ICD-10-CM | POA: Diagnosis not present

## 2016-05-11 DIAGNOSIS — R001 Bradycardia, unspecified: Secondary | ICD-10-CM | POA: Diagnosis not present

## 2016-05-11 DIAGNOSIS — I5022 Chronic systolic (congestive) heart failure: Secondary | ICD-10-CM | POA: Diagnosis not present

## 2016-05-13 DIAGNOSIS — L97221 Non-pressure chronic ulcer of left calf limited to breakdown of skin: Secondary | ICD-10-CM | POA: Diagnosis not present

## 2016-05-13 DIAGNOSIS — N400 Enlarged prostate without lower urinary tract symptoms: Secondary | ICD-10-CM | POA: Diagnosis not present

## 2016-05-13 DIAGNOSIS — R001 Bradycardia, unspecified: Secondary | ICD-10-CM | POA: Diagnosis not present

## 2016-05-13 DIAGNOSIS — I872 Venous insufficiency (chronic) (peripheral): Secondary | ICD-10-CM | POA: Diagnosis not present

## 2016-05-13 DIAGNOSIS — I110 Hypertensive heart disease with heart failure: Secondary | ICD-10-CM | POA: Diagnosis not present

## 2016-05-13 DIAGNOSIS — G9341 Metabolic encephalopathy: Secondary | ICD-10-CM | POA: Diagnosis not present

## 2016-05-13 DIAGNOSIS — I5022 Chronic systolic (congestive) heart failure: Secondary | ICD-10-CM | POA: Diagnosis not present

## 2016-05-13 DIAGNOSIS — E039 Hypothyroidism, unspecified: Secondary | ICD-10-CM | POA: Diagnosis not present

## 2016-05-13 DIAGNOSIS — I4891 Unspecified atrial fibrillation: Secondary | ICD-10-CM | POA: Diagnosis not present

## 2016-05-16 DIAGNOSIS — I872 Venous insufficiency (chronic) (peripheral): Secondary | ICD-10-CM | POA: Diagnosis not present

## 2016-05-16 DIAGNOSIS — N400 Enlarged prostate without lower urinary tract symptoms: Secondary | ICD-10-CM | POA: Diagnosis not present

## 2016-05-16 DIAGNOSIS — E039 Hypothyroidism, unspecified: Secondary | ICD-10-CM | POA: Diagnosis not present

## 2016-05-16 DIAGNOSIS — I4891 Unspecified atrial fibrillation: Secondary | ICD-10-CM | POA: Diagnosis not present

## 2016-05-16 DIAGNOSIS — G9341 Metabolic encephalopathy: Secondary | ICD-10-CM | POA: Diagnosis not present

## 2016-05-16 DIAGNOSIS — R001 Bradycardia, unspecified: Secondary | ICD-10-CM | POA: Diagnosis not present

## 2016-05-16 DIAGNOSIS — I110 Hypertensive heart disease with heart failure: Secondary | ICD-10-CM | POA: Diagnosis not present

## 2016-05-16 DIAGNOSIS — I5022 Chronic systolic (congestive) heart failure: Secondary | ICD-10-CM | POA: Diagnosis not present

## 2016-05-16 DIAGNOSIS — L97221 Non-pressure chronic ulcer of left calf limited to breakdown of skin: Secondary | ICD-10-CM | POA: Diagnosis not present

## 2016-05-17 DIAGNOSIS — I5022 Chronic systolic (congestive) heart failure: Secondary | ICD-10-CM | POA: Diagnosis not present

## 2016-05-17 DIAGNOSIS — G9341 Metabolic encephalopathy: Secondary | ICD-10-CM | POA: Diagnosis not present

## 2016-05-17 DIAGNOSIS — E039 Hypothyroidism, unspecified: Secondary | ICD-10-CM | POA: Diagnosis not present

## 2016-05-17 DIAGNOSIS — I4891 Unspecified atrial fibrillation: Secondary | ICD-10-CM | POA: Diagnosis not present

## 2016-05-17 DIAGNOSIS — I110 Hypertensive heart disease with heart failure: Secondary | ICD-10-CM | POA: Diagnosis not present

## 2016-05-17 DIAGNOSIS — R001 Bradycardia, unspecified: Secondary | ICD-10-CM | POA: Diagnosis not present

## 2016-05-17 DIAGNOSIS — L97221 Non-pressure chronic ulcer of left calf limited to breakdown of skin: Secondary | ICD-10-CM | POA: Diagnosis not present

## 2016-05-17 DIAGNOSIS — I872 Venous insufficiency (chronic) (peripheral): Secondary | ICD-10-CM | POA: Diagnosis not present

## 2016-05-19 DIAGNOSIS — G9341 Metabolic encephalopathy: Secondary | ICD-10-CM | POA: Diagnosis not present

## 2016-05-19 DIAGNOSIS — I5022 Chronic systolic (congestive) heart failure: Secondary | ICD-10-CM | POA: Diagnosis not present

## 2016-05-19 DIAGNOSIS — I4891 Unspecified atrial fibrillation: Secondary | ICD-10-CM | POA: Diagnosis not present

## 2016-05-19 DIAGNOSIS — I110 Hypertensive heart disease with heart failure: Secondary | ICD-10-CM | POA: Diagnosis not present

## 2016-05-19 DIAGNOSIS — I872 Venous insufficiency (chronic) (peripheral): Secondary | ICD-10-CM | POA: Diagnosis not present

## 2016-05-19 DIAGNOSIS — N400 Enlarged prostate without lower urinary tract symptoms: Secondary | ICD-10-CM | POA: Diagnosis not present

## 2016-05-19 DIAGNOSIS — R001 Bradycardia, unspecified: Secondary | ICD-10-CM | POA: Diagnosis not present

## 2016-05-19 DIAGNOSIS — L97221 Non-pressure chronic ulcer of left calf limited to breakdown of skin: Secondary | ICD-10-CM | POA: Diagnosis not present

## 2016-05-19 DIAGNOSIS — E039 Hypothyroidism, unspecified: Secondary | ICD-10-CM | POA: Diagnosis not present

## 2016-05-20 DIAGNOSIS — R001 Bradycardia, unspecified: Secondary | ICD-10-CM | POA: Diagnosis not present

## 2016-05-20 DIAGNOSIS — G9341 Metabolic encephalopathy: Secondary | ICD-10-CM | POA: Diagnosis not present

## 2016-05-20 DIAGNOSIS — I4891 Unspecified atrial fibrillation: Secondary | ICD-10-CM | POA: Diagnosis not present

## 2016-05-20 DIAGNOSIS — E039 Hypothyroidism, unspecified: Secondary | ICD-10-CM | POA: Diagnosis not present

## 2016-05-20 DIAGNOSIS — I872 Venous insufficiency (chronic) (peripheral): Secondary | ICD-10-CM | POA: Diagnosis not present

## 2016-05-20 DIAGNOSIS — L97221 Non-pressure chronic ulcer of left calf limited to breakdown of skin: Secondary | ICD-10-CM | POA: Diagnosis not present

## 2016-05-20 DIAGNOSIS — N400 Enlarged prostate without lower urinary tract symptoms: Secondary | ICD-10-CM | POA: Diagnosis not present

## 2016-05-20 DIAGNOSIS — I110 Hypertensive heart disease with heart failure: Secondary | ICD-10-CM | POA: Diagnosis not present

## 2016-05-20 DIAGNOSIS — I5022 Chronic systolic (congestive) heart failure: Secondary | ICD-10-CM | POA: Diagnosis not present

## 2016-05-24 DIAGNOSIS — I110 Hypertensive heart disease with heart failure: Secondary | ICD-10-CM | POA: Diagnosis not present

## 2016-05-24 DIAGNOSIS — G9341 Metabolic encephalopathy: Secondary | ICD-10-CM | POA: Diagnosis not present

## 2016-05-24 DIAGNOSIS — N400 Enlarged prostate without lower urinary tract symptoms: Secondary | ICD-10-CM | POA: Diagnosis not present

## 2016-05-24 DIAGNOSIS — R001 Bradycardia, unspecified: Secondary | ICD-10-CM | POA: Diagnosis not present

## 2016-05-24 DIAGNOSIS — I5022 Chronic systolic (congestive) heart failure: Secondary | ICD-10-CM | POA: Diagnosis not present

## 2016-05-24 DIAGNOSIS — E039 Hypothyroidism, unspecified: Secondary | ICD-10-CM | POA: Diagnosis not present

## 2016-05-24 DIAGNOSIS — L97221 Non-pressure chronic ulcer of left calf limited to breakdown of skin: Secondary | ICD-10-CM | POA: Diagnosis not present

## 2016-05-24 DIAGNOSIS — I4891 Unspecified atrial fibrillation: Secondary | ICD-10-CM | POA: Diagnosis not present

## 2016-05-24 DIAGNOSIS — I872 Venous insufficiency (chronic) (peripheral): Secondary | ICD-10-CM | POA: Diagnosis not present

## 2016-05-25 DIAGNOSIS — R001 Bradycardia, unspecified: Secondary | ICD-10-CM | POA: Diagnosis not present

## 2016-05-25 DIAGNOSIS — I110 Hypertensive heart disease with heart failure: Secondary | ICD-10-CM | POA: Diagnosis not present

## 2016-05-25 DIAGNOSIS — I872 Venous insufficiency (chronic) (peripheral): Secondary | ICD-10-CM | POA: Diagnosis not present

## 2016-05-25 DIAGNOSIS — I4891 Unspecified atrial fibrillation: Secondary | ICD-10-CM | POA: Diagnosis not present

## 2016-05-25 DIAGNOSIS — I5022 Chronic systolic (congestive) heart failure: Secondary | ICD-10-CM | POA: Diagnosis not present

## 2016-05-25 DIAGNOSIS — E039 Hypothyroidism, unspecified: Secondary | ICD-10-CM | POA: Diagnosis not present

## 2016-05-25 DIAGNOSIS — L97221 Non-pressure chronic ulcer of left calf limited to breakdown of skin: Secondary | ICD-10-CM | POA: Diagnosis not present

## 2016-05-25 DIAGNOSIS — G9341 Metabolic encephalopathy: Secondary | ICD-10-CM | POA: Diagnosis not present

## 2016-05-25 DIAGNOSIS — N400 Enlarged prostate without lower urinary tract symptoms: Secondary | ICD-10-CM | POA: Diagnosis not present

## 2016-05-27 DIAGNOSIS — I872 Venous insufficiency (chronic) (peripheral): Secondary | ICD-10-CM | POA: Diagnosis not present

## 2016-05-27 DIAGNOSIS — I110 Hypertensive heart disease with heart failure: Secondary | ICD-10-CM | POA: Diagnosis not present

## 2016-05-27 DIAGNOSIS — R001 Bradycardia, unspecified: Secondary | ICD-10-CM | POA: Diagnosis not present

## 2016-05-27 DIAGNOSIS — G9341 Metabolic encephalopathy: Secondary | ICD-10-CM | POA: Diagnosis not present

## 2016-05-27 DIAGNOSIS — L97221 Non-pressure chronic ulcer of left calf limited to breakdown of skin: Secondary | ICD-10-CM | POA: Diagnosis not present

## 2016-05-27 DIAGNOSIS — N400 Enlarged prostate without lower urinary tract symptoms: Secondary | ICD-10-CM | POA: Diagnosis not present

## 2016-05-27 DIAGNOSIS — I5022 Chronic systolic (congestive) heart failure: Secondary | ICD-10-CM | POA: Diagnosis not present

## 2016-05-27 DIAGNOSIS — E039 Hypothyroidism, unspecified: Secondary | ICD-10-CM | POA: Diagnosis not present

## 2016-05-27 DIAGNOSIS — I4891 Unspecified atrial fibrillation: Secondary | ICD-10-CM | POA: Diagnosis not present

## 2016-05-30 DIAGNOSIS — I4891 Unspecified atrial fibrillation: Secondary | ICD-10-CM | POA: Diagnosis not present

## 2016-05-30 DIAGNOSIS — L97221 Non-pressure chronic ulcer of left calf limited to breakdown of skin: Secondary | ICD-10-CM | POA: Diagnosis not present

## 2016-05-30 DIAGNOSIS — G9341 Metabolic encephalopathy: Secondary | ICD-10-CM | POA: Diagnosis not present

## 2016-05-30 DIAGNOSIS — R001 Bradycardia, unspecified: Secondary | ICD-10-CM | POA: Diagnosis not present

## 2016-05-30 DIAGNOSIS — I110 Hypertensive heart disease with heart failure: Secondary | ICD-10-CM | POA: Diagnosis not present

## 2016-05-30 DIAGNOSIS — I5022 Chronic systolic (congestive) heart failure: Secondary | ICD-10-CM | POA: Diagnosis not present

## 2016-05-30 DIAGNOSIS — I872 Venous insufficiency (chronic) (peripheral): Secondary | ICD-10-CM | POA: Diagnosis not present

## 2016-05-30 DIAGNOSIS — E039 Hypothyroidism, unspecified: Secondary | ICD-10-CM | POA: Diagnosis not present

## 2016-05-30 DIAGNOSIS — N400 Enlarged prostate without lower urinary tract symptoms: Secondary | ICD-10-CM | POA: Diagnosis not present

## 2016-06-20 DIAGNOSIS — I5022 Chronic systolic (congestive) heart failure: Secondary | ICD-10-CM | POA: Insufficient documentation

## 2016-07-22 DIAGNOSIS — I5022 Chronic systolic (congestive) heart failure: Secondary | ICD-10-CM | POA: Diagnosis not present

## 2016-07-22 DIAGNOSIS — I4891 Unspecified atrial fibrillation: Secondary | ICD-10-CM | POA: Diagnosis not present

## 2016-07-22 DIAGNOSIS — R600 Localized edema: Secondary | ICD-10-CM | POA: Diagnosis not present

## 2016-07-22 DIAGNOSIS — I4892 Unspecified atrial flutter: Secondary | ICD-10-CM | POA: Diagnosis not present

## 2016-08-23 DIAGNOSIS — I4891 Unspecified atrial fibrillation: Secondary | ICD-10-CM | POA: Diagnosis not present

## 2016-08-23 DIAGNOSIS — I4892 Unspecified atrial flutter: Secondary | ICD-10-CM | POA: Diagnosis not present

## 2016-09-23 ENCOUNTER — Emergency Department: Payer: No Typology Code available for payment source

## 2016-09-23 ENCOUNTER — Encounter: Payer: Self-pay | Admitting: Emergency Medicine

## 2016-09-23 ENCOUNTER — Emergency Department
Admission: EM | Admit: 2016-09-23 | Discharge: 2016-09-23 | Disposition: A | Discharge status: Home / Self Care | Payer: No Typology Code available for payment source | Attending: Emergency Medicine | Admitting: Emergency Medicine

## 2016-09-23 DIAGNOSIS — S279XXA Injury of unspecified intrathoracic organ, initial encounter: Secondary | ICD-10-CM | POA: Diagnosis not present

## 2016-09-23 DIAGNOSIS — Y92410 Unspecified street and highway as the place of occurrence of the external cause: Secondary | ICD-10-CM | POA: Insufficient documentation

## 2016-09-23 DIAGNOSIS — Y999 Unspecified external cause status: Secondary | ICD-10-CM | POA: Diagnosis not present

## 2016-09-23 DIAGNOSIS — Z79899 Other long term (current) drug therapy: Secondary | ICD-10-CM | POA: Diagnosis not present

## 2016-09-23 DIAGNOSIS — Y939 Activity, unspecified: Secondary | ICD-10-CM | POA: Insufficient documentation

## 2016-09-23 DIAGNOSIS — Z87891 Personal history of nicotine dependence: Secondary | ICD-10-CM | POA: Insufficient documentation

## 2016-09-23 DIAGNOSIS — S20219A Contusion of unspecified front wall of thorax, initial encounter: Secondary | ICD-10-CM | POA: Insufficient documentation

## 2016-09-23 DIAGNOSIS — R0789 Other chest pain: Secondary | ICD-10-CM | POA: Diagnosis not present

## 2016-09-23 DIAGNOSIS — I110 Hypertensive heart disease with heart failure: Secondary | ICD-10-CM | POA: Diagnosis not present

## 2016-09-23 DIAGNOSIS — S299XXA Unspecified injury of thorax, initial encounter: Secondary | ICD-10-CM | POA: Diagnosis not present

## 2016-09-23 DIAGNOSIS — I5032 Chronic diastolic (congestive) heart failure: Secondary | ICD-10-CM | POA: Diagnosis not present

## 2016-09-23 LAB — BASIC METABOLIC PANEL
Anion gap: 8 (ref 5–15)
BUN: 28 mg/dL — AB (ref 6–20)
CHLORIDE: 103 mmol/L (ref 101–111)
CO2: 26 mmol/L (ref 22–32)
Calcium: 9.1 mg/dL (ref 8.9–10.3)
Creatinine, Ser: 1.75 mg/dL — ABNORMAL HIGH (ref 0.61–1.24)
GFR calc Af Amer: 43 mL/min — ABNORMAL LOW (ref >60)
GFR calc non Af Amer: 37 mL/min — ABNORMAL LOW (ref >60)
GLUCOSE: 85 mg/dL (ref 65–99)
POTASSIUM: 3.7 mmol/L (ref 3.5–5.1)
Sodium: 137 mmol/L (ref 135–145)

## 2016-09-23 LAB — HEPATIC FUNCTION PANEL
ALK PHOS: 84 U/L (ref 38–126)
ALT: 25 U/L (ref 17–63)
AST: 42 U/L — ABNORMAL HIGH (ref 15–41)
Albumin: 4.2 g/dL (ref 3.5–5.0)
BILIRUBIN DIRECT: 0.2 mg/dL (ref 0.1–0.5)
BILIRUBIN INDIRECT: 0.9 mg/dL (ref 0.3–0.9)
Total Bilirubin: 1.1 mg/dL (ref 0.3–1.2)
Total Protein: 7.5 g/dL (ref 6.5–8.1)

## 2016-09-23 LAB — CBC
HCT: 35.2 % — ABNORMAL LOW (ref 40.0–52.0)
Hemoglobin: 11.7 g/dL — ABNORMAL LOW (ref 13.0–18.0)
MCH: 30.3 pg (ref 26.0–34.0)
MCHC: 33.2 g/dL (ref 32.0–36.0)
MCV: 91.3 fL (ref 80.0–100.0)
Platelets: 299 10*3/uL (ref 150–440)
RBC: 3.86 MIL/uL — AB (ref 4.40–5.90)
RDW: 14.2 % (ref 11.5–14.5)
WBC: 10.7 10*3/uL — ABNORMAL HIGH (ref 3.8–10.6)

## 2016-09-23 LAB — TROPONIN I
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
## 2016-09-23 NOTE — ED Notes (Signed)
Pt calling for ride home 

## 2016-09-23 NOTE — ED Notes (Signed)
Pt alert and oriented X4, active, cooperative, pt in NAD. RR even and unlabored, color WNL.  Pt informed to return if any life threatening symptoms occur.   
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
## 2016-10-06 DIAGNOSIS — I5022 Chronic systolic (congestive) heart failure: Secondary | ICD-10-CM | POA: Diagnosis not present

## 2016-10-06 DIAGNOSIS — I4891 Unspecified atrial fibrillation: Secondary | ICD-10-CM | POA: Diagnosis not present

## 2016-10-06 DIAGNOSIS — I10 Essential (primary) hypertension: Secondary | ICD-10-CM | POA: Diagnosis not present

## 2016-10-06 DIAGNOSIS — I4892 Unspecified atrial flutter: Secondary | ICD-10-CM | POA: Diagnosis not present

## 2016-10-06 DIAGNOSIS — E039 Hypothyroidism, unspecified: Secondary | ICD-10-CM | POA: Diagnosis not present

## 2016-10-06 DIAGNOSIS — Z8619 Personal history of other infectious and parasitic diseases: Secondary | ICD-10-CM | POA: Diagnosis not present

## 2016-10-07 DIAGNOSIS — R05 Cough: Secondary | ICD-10-CM | POA: Diagnosis not present

## 2016-10-17 DIAGNOSIS — I5022 Chronic systolic (congestive) heart failure: Secondary | ICD-10-CM | POA: Diagnosis not present

## 2016-10-17 DIAGNOSIS — I4891 Unspecified atrial fibrillation: Secondary | ICD-10-CM | POA: Diagnosis not present

## 2016-10-17 DIAGNOSIS — Z950 Presence of cardiac pacemaker: Secondary | ICD-10-CM | POA: Diagnosis not present

## 2016-10-17 DIAGNOSIS — I10 Essential (primary) hypertension: Secondary | ICD-10-CM | POA: Diagnosis not present

## 2016-10-17 DIAGNOSIS — I519 Heart disease, unspecified: Secondary | ICD-10-CM | POA: Diagnosis not present

## 2016-10-17 DIAGNOSIS — R001 Bradycardia, unspecified: Secondary | ICD-10-CM | POA: Diagnosis not present

## 2016-10-17 DIAGNOSIS — Z7901 Long term (current) use of anticoagulants: Secondary | ICD-10-CM | POA: Diagnosis not present

## 2016-10-17 DIAGNOSIS — I4892 Unspecified atrial flutter: Secondary | ICD-10-CM | POA: Diagnosis not present

## 2016-10-17 DIAGNOSIS — I495 Sick sinus syndrome: Secondary | ICD-10-CM | POA: Diagnosis not present

## 2017-02-27 ENCOUNTER — Inpatient Hospital Stay
Admission: EM | Admit: 2017-02-27 | Discharge: 2017-03-02 | DRG: 378 | Disposition: A | Discharge status: Home Health | Payer: Medicare HMO | Attending: Internal Medicine | Admitting: Internal Medicine

## 2017-02-27 ENCOUNTER — Encounter: Payer: Self-pay | Admitting: Emergency Medicine

## 2017-02-27 DIAGNOSIS — Z87891 Personal history of nicotine dependence: Secondary | ICD-10-CM

## 2017-02-27 DIAGNOSIS — I5032 Chronic diastolic (congestive) heart failure: Secondary | ICD-10-CM | POA: Diagnosis present

## 2017-02-27 DIAGNOSIS — K2961 Other gastritis with bleeding: Principal | ICD-10-CM | POA: Diagnosis present

## 2017-02-27 DIAGNOSIS — K640 First degree hemorrhoids: Secondary | ICD-10-CM | POA: Diagnosis not present

## 2017-02-27 DIAGNOSIS — D62 Acute posthemorrhagic anemia: Secondary | ICD-10-CM | POA: Diagnosis not present

## 2017-02-27 DIAGNOSIS — E669 Obesity, unspecified: Secondary | ICD-10-CM | POA: Diagnosis present

## 2017-02-27 DIAGNOSIS — Z79899 Other long term (current) drug therapy: Secondary | ICD-10-CM

## 2017-02-27 DIAGNOSIS — I482 Chronic atrial fibrillation: Secondary | ICD-10-CM | POA: Diagnosis present

## 2017-02-27 DIAGNOSIS — Z79890 Hormone replacement therapy (postmenopausal): Secondary | ICD-10-CM

## 2017-02-27 DIAGNOSIS — K922 Gastrointestinal hemorrhage, unspecified: Secondary | ICD-10-CM | POA: Diagnosis not present

## 2017-02-27 DIAGNOSIS — K0889 Other specified disorders of teeth and supporting structures: Secondary | ICD-10-CM | POA: Diagnosis present

## 2017-02-27 DIAGNOSIS — K5730 Diverticulosis of large intestine without perforation or abscess without bleeding: Secondary | ICD-10-CM | POA: Diagnosis not present

## 2017-02-27 DIAGNOSIS — Z880 Allergy status to penicillin: Secondary | ICD-10-CM

## 2017-02-27 DIAGNOSIS — K219 Gastro-esophageal reflux disease without esophagitis: Secondary | ICD-10-CM | POA: Diagnosis present

## 2017-02-27 DIAGNOSIS — Z6830 Body mass index (BMI) 30.0-30.9, adult: Secondary | ICD-10-CM | POA: Diagnosis not present

## 2017-02-27 DIAGNOSIS — N183 Chronic kidney disease, stage 3 (moderate): Secondary | ICD-10-CM | POA: Diagnosis present

## 2017-02-27 DIAGNOSIS — D649 Anemia, unspecified: Secondary | ICD-10-CM | POA: Diagnosis present

## 2017-02-27 DIAGNOSIS — K2970 Gastritis, unspecified, without bleeding: Secondary | ICD-10-CM | POA: Diagnosis not present

## 2017-02-27 DIAGNOSIS — Z806 Family history of leukemia: Secondary | ICD-10-CM

## 2017-02-27 DIAGNOSIS — E039 Hypothyroidism, unspecified: Secondary | ICD-10-CM | POA: Diagnosis present

## 2017-02-27 DIAGNOSIS — I130 Hypertensive heart and chronic kidney disease with heart failure and stage 1 through stage 4 chronic kidney disease, or unspecified chronic kidney disease: Secondary | ICD-10-CM | POA: Diagnosis present

## 2017-02-27 DIAGNOSIS — Z7901 Long term (current) use of anticoagulants: Secondary | ICD-10-CM

## 2017-02-27 DIAGNOSIS — K921 Melena: Secondary | ICD-10-CM | POA: Diagnosis present

## 2017-02-27 DIAGNOSIS — D509 Iron deficiency anemia, unspecified: Secondary | ICD-10-CM | POA: Diagnosis not present

## 2017-02-27 DIAGNOSIS — I4892 Unspecified atrial flutter: Secondary | ICD-10-CM | POA: Diagnosis present

## 2017-02-27 DIAGNOSIS — Z950 Presence of cardiac pacemaker: Secondary | ICD-10-CM

## 2017-02-27 LAB — COMPREHENSIVE METABOLIC PANEL
ALT: 9 U/L — AB (ref 17–63)
AST: 39 U/L (ref 15–41)
Albumin: 4 g/dL (ref 3.5–5.0)
Alkaline Phosphatase: 48 U/L (ref 38–126)
Anion gap: 8 (ref 5–15)
BUN: 26 mg/dL — AB (ref 6–20)
CO2: 22 mmol/L (ref 22–32)
CREATININE: 2.42 mg/dL — AB (ref 0.61–1.24)
Calcium: 8.7 mg/dL — ABNORMAL LOW (ref 8.9–10.3)
Chloride: 106 mmol/L (ref 101–111)
GFR calc Af Amer: 29 mL/min — ABNORMAL LOW (ref >60)
GFR calc non Af Amer: 25 mL/min — ABNORMAL LOW (ref >60)
GLUCOSE: 101 mg/dL — AB (ref 65–99)
POTASSIUM: 4.1 mmol/L (ref 3.5–5.1)
Sodium: 136 mmol/L (ref 135–145)
Total Bilirubin: 1.5 mg/dL — ABNORMAL HIGH (ref 0.3–1.2)
Total Protein: 6.7 g/dL (ref 6.5–8.1)

## 2017-02-27 LAB — CBC WITH DIFFERENTIAL/PLATELET
BASOS ABS: 0.1 10*3/uL (ref 0–0.1)
Basophils Relative: 2 %
Eosinophils Absolute: 0.1 10*3/uL (ref 0–0.7)
Eosinophils Relative: 1 %
HEMATOCRIT: 19.8 % — AB (ref 40.0–52.0)
Hemoglobin: 5.8 g/dL — ABNORMAL LOW (ref 13.0–18.0)
LYMPHS PCT: 21 %
Lymphs Abs: 1 10*3/uL (ref 1.0–3.6)
MCH: 22.1 pg — ABNORMAL LOW (ref 26.0–34.0)
MCHC: 29.3 g/dL — ABNORMAL LOW (ref 32.0–36.0)
MCV: 75.5 fL — ABNORMAL LOW (ref 80.0–100.0)
MONO ABS: 0.3 10*3/uL (ref 0.2–1.0)
Monocytes Relative: 7 %
NEUTROS ABS: 3.3 10*3/uL (ref 1.4–6.5)
Neutrophils Relative %: 69 %
PLATELETS: 300 10*3/uL (ref 150–440)
RBC: 2.63 MIL/uL — AB (ref 4.40–5.90)
RDW: 16.1 % — AB (ref 11.5–14.5)
WBC: 4.8 10*3/uL (ref 3.8–10.6)

## 2017-02-27 LAB — PROTIME-INR
INR: 1.79
Prothrombin Time: 21 seconds — ABNORMAL HIGH (ref 11.4–15.2)

## 2017-02-27 LAB — ABO/RH: ABO/RH(D): A NEG

## 2017-02-27 LAB — PREPARE RBC (CROSSMATCH)

## 2017-02-27 MED ORDER — ONDANSETRON HCL 4 MG/2ML IJ SOLN
4.0000 mg | Freq: Four times a day (QID) | INTRAMUSCULAR | Status: DC | PRN
  Filled 2017-02-27: qty 2

## 2017-02-27 MED ORDER — LEVOTHYROXINE SODIUM 25 MCG PO TABS
25.0000 ug | ORAL_TABLET | Freq: Every day | ORAL | Status: DC
  Administered 2017-02-28 – 2017-03-02 (×3): 25 ug via ORAL
  Filled 2017-02-27 (×3): qty 1

## 2017-02-27 MED ORDER — VITAMIN B-12 1000 MCG PO TABS
1000.0000 ug | ORAL_TABLET | Freq: Every day | ORAL | Status: DC
  Administered 2017-02-28 – 2017-03-02 (×3): 1000 ug via ORAL
  Filled 2017-02-27 (×3): qty 1

## 2017-02-27 MED ORDER — FERROUS SULFATE 325 (65 FE) MG PO TABS
325.0000 mg | ORAL_TABLET | Freq: Every day | ORAL | Status: DC
  Administered 2017-02-28 – 2017-03-01 (×2): 325 mg via ORAL
  Filled 2017-02-27 (×2): qty 1

## 2017-02-27 MED ORDER — METOPROLOL TARTRATE 50 MG PO TABS
50.0000 mg | ORAL_TABLET | Freq: Two times a day (BID) | ORAL | Status: DC
  Administered 2017-02-28 – 2017-03-01 (×3): 50 mg via ORAL
  Filled 2017-02-27 (×5): qty 1

## 2017-02-27 MED ORDER — ACETAMINOPHEN 325 MG PO TABS
650.0000 mg | ORAL_TABLET | Freq: Four times a day (QID) | ORAL | Status: DC | PRN
  Filled 2017-02-27: qty 2

## 2017-02-27 MED ORDER — SENNA 8.6 MG PO TABS
1.0000 | ORAL_TABLET | Freq: Two times a day (BID) | ORAL | Status: DC
  Administered 2017-02-27 – 2017-03-01 (×3): 8.6 mg via ORAL
  Filled 2017-02-27 (×5): qty 1

## 2017-02-27 MED ORDER — AMIODARONE HCL 200 MG PO TABS
100.0000 mg | ORAL_TABLET | Freq: Every day | ORAL | Status: DC
  Administered 2017-03-01 – 2017-03-02 (×2): 100 mg via ORAL
  Filled 2017-02-27 (×3): qty 1

## 2017-02-27 MED ORDER — POTASSIUM CHLORIDE CRYS ER 10 MEQ PO TBCR
10.0000 meq | EXTENDED_RELEASE_TABLET | Freq: Every day | ORAL | Status: DC
  Administered 2017-02-28 – 2017-03-02 (×3): 10 meq via ORAL
  Filled 2017-02-27 (×3): qty 1

## 2017-02-27 MED ORDER — TORSEMIDE 20 MG PO TABS
10.0000 mg | ORAL_TABLET | Freq: Every day | ORAL | Status: DC
  Administered 2017-02-28 – 2017-03-02 (×2): 10 mg via ORAL
  Filled 2017-02-27 (×2): qty 1

## 2017-02-27 MED ORDER — SODIUM CHLORIDE 0.9 % IV SOLN
10.0000 mL/h | Freq: Once | INTRAVENOUS | Status: AC
  Administered 2017-02-27: 10 mL/h via INTRAVENOUS

## 2017-02-27 MED ORDER — ONDANSETRON HCL 4 MG PO TABS
4.0000 mg | ORAL_TABLET | Freq: Four times a day (QID) | ORAL | Status: DC | PRN
  Filled 2017-02-27: qty 1

## 2017-02-27 MED ORDER — POLYETHYLENE GLYCOL 3350 17 G PO PACK
17.0000 g | PACK | Freq: Every day | ORAL | Status: DC | PRN
  Filled 2017-02-27: qty 1

## 2017-02-27 MED ORDER — ACETAMINOPHEN 650 MG RE SUPP
650.0000 mg | Freq: Four times a day (QID) | RECTAL | Status: DC | PRN
  Filled 2017-02-27: qty 1

## 2017-02-27 MED ORDER — SODIUM CHLORIDE 0.9 % IV SOLN
Freq: Once | INTRAVENOUS | Status: AC
  Administered 2017-02-27: 20:00:00 via INTRAVENOUS

## 2017-02-27 NOTE — ED Notes (Signed)
Pt denies SOB, n/v/d, LOC, dizziness or any other s/s.

## 2017-02-27 NOTE — ED Notes (Signed)
Deidra RN

## 2017-02-27 NOTE — ED Notes (Signed)
Blood bank called to inform RN blood ready

## 2017-02-27 NOTE — ED Triage Notes (Signed)
Patient brought in for low hemoglobin. Per note hgb 6.4. Patient states he takes eloquis and has hemorrhoids and that he frequently has bleeding with stools but is unaware of other blood loss.

## 2017-02-27 NOTE — ED Notes (Signed)
Admitting provider at bedside.

## 2017-02-27 NOTE — H&P (Signed)
[facility] - [place] at [facility]   PATIENT NAME: Edward [name]    MR#:  [id number]  DATE OF BIRTH:  11-03-1945  DATE OF ADMISSION:  02/27/2017  PRIMARY CARE PHYSICIAN: Lauro Regulus, MD   REQUESTING/REFERRING PHYSICIAN: Dr. Alphonzo Lemmings  CHIEF COMPLAINT:  Low hemoglobin and routine blood work by primary care  HISTORY OF PRESENT ILLNESS:  Edward [name]  is a 71 y.o. male with a known history of Atrial fibrillation/flutter on amiodarone and oral anticoagulation, hypertension, internal hemorrhoids and diverticulosis with on and off rectal bleed for several months comes to the emergency room where hemoglobin was found to be low at 5.8. His baseline hemoglobin is 11.3 in February 2018. Patient denies any abdominal pain. He denies any severe bleeding. He states he has history of constipation and his hemorrhoids act up and he starts bleeding.  Within the lumen of 5.8 is going to be getting 1 unit of blood transfusion. It was also noted his creatinine is up to 2.47. Baseline creatinine around 1.3.  No family the ER.  PAST MEDICAL HISTORY:   Past Medical History:  Diagnosis Date  . Anemia   . Atrial fibrillation and flutter (HCC)   . GERD (gastroesophageal reflux disease)   . HTN (hypertension)   . Internal hemorrhoids with complication   . Sinoatrial node dysfunction (HCC)     PAST SURGICAL HISTOIRY:   Past Surgical History:  Procedure Laterality Date  . CARDIOVERSION N/A 07/11/2014   Procedure: CARDIOVERSION;  Surgeon: Pricilla Riffle, MD;  Location[facility] ENDOSCOPY;  Service: Cardiovascular;  Laterality: N/A;  . HEMORROIDECTOMY    . PACEMAKER PLACEMENT    . TEE WITHOUT CARDIOVERSION N/A 07/11/2014   Procedure: TRANSESOPHAGEAL ECHOCARDIOGRAM (TEE);  Surgeon: Pricilla Riffle, MD;  Location: [facility] ENDOSCOPY;  Service: Cardiovascular;  Laterality: N/A;    SOCIAL HISTORY:   Social History  Substance Use Topics  . Smoking status: Former Smoker    Types:  Cigarettes    Quit date: 11/05/1965  . Smokeless tobacco: Never Used  . Alcohol use No    FAMILY HISTORY:   Family History  Problem Relation Age of Onset  . Leukemia Mother     DRUG ALLERGIES:   Allergies  Allergen Reactions  . Penicillins Rash    Patient rechallenged with Zosyn in Nov 2015 and developed abdominal rash within 12 hours.  .Has patient had a PCN reaction causing immediate rash, facial/tongue/throat swelling, SOB or lightheadedness with hypotension: No Has patient had a PCN reaction causing severe rash involving mucus membranes or skin necrosis: No Has patient had a PCN reaction that required hospitalization: No Has patient had a PCN reaction occurring within the last 10 years: No If all of the above answers are "NO", then may     REVIEW OF SYSTEMS:  Review of Systems  Constitutional: Negative for chills, fever and weight loss.  HENT: Negative for ear discharge, ear pain and nosebleeds.   Eyes: Negative for blurred vision, pain and discharge.  Respiratory: Negative for sputum production, shortness of breath, wheezing and stridor.   Cardiovascular: Negative for chest pain, palpitations, orthopnea and PND.  Gastrointestinal: Positive for blood in stool. Negative for abdominal pain, diarrhea, nausea and vomiting.  Genitourinary: Negative for frequency and urgency.  Musculoskeletal: Negative for back pain and joint pain.  Neurological: Positive for weakness. Negative for sensory change, speech change and focal weakness.  Psychiatric/Behavioral: Negative for depression and hallucinations. The patient is not nervous/anxious.      MEDICATIONS  AT HOME:   Prior to Admission medications   Medication Sig Start Date End Date Taking? Authorizing Provider  amiodarone (PACERONE) 200 MG tablet Take 0.5 tablets (100 mg total) by mouth 2 (two) times daily. Begin after you stop the 400 mg pill Patient taking differently: Take 100 mg by mouth daily.  07/15/14  Yes Elenora Gamma, MD  apixaban (ELIQUIS) 5 MG TABS tablet Take 1 tablet (5 mg total) by mouth 2 (two) times daily. 07/15/14  Yes Elenora Gamma, MD  ferrous sulfate 325 (65 FE) MG tablet Take 1 tablet (325 mg total) by mouth daily with breakfast. 07/15/14  Yes Elenora Gamma, MD  KLOR-CON M10 10 MEQ tablet Take 10 mEq by mouth daily.  07/02/14  Yes [provider]  levothyroxine (SYNTHROID, LEVOTHROID) 25 MCG tablet Take 25 mcg by mouth daily. 02/14/17  Yes [provider]  metoprolol (LOPRESSOR) 50 MG tablet Take 1 tablet (50 mg total) by mouth 2 (two) times daily. 07/15/14  Yes Elenora Gamma, MD  torsemide (DEMADEX) 5 MG tablet Take 1 tablet (5 mg total) by mouth daily. Patient taking differently: Take 10 mg by mouth daily.  07/15/14  Yes Elenora Gamma, MD  vitamin B-12 (CYANOCOBALAMIN) 1000 MCG tablet Take 1,000 mcg by mouth daily.   Yes [provider]      VITAL SIGNS:  Blood pressure 110/64, pulse 62, temperature 98 F (36.7 C), temperature source Oral, resp. rate 14, height 5\' 8"  (1.727 m), weight 98.4 kg (217 lb), SpO2 100 %.  PHYSICAL EXAMINATION:  GENERAL:  71 y.o.-year-old patient lying in the bed with no acute distress.  EYES: Pupils equal, round, reactive to light and accommodation. No scleral icterus. Extraocular muscles intact. Pallor+ HEENT: Head atraumatic, normocephalic. Oropharynx and nasopharynx clear.  NECK:  Supple, no jugular venous distention. No thyroid enlargement, no tenderness.  LUNGS: Normal breath sounds bilaterally, no wheezing, rales,rhonchi or crepitation. No use of accessory muscles of respiration.  CARDIOVASCULAR: S1, S2 normal. No murmurs, rubs, or gallops.  ABDOMEN: Soft, nontender, nondistended. Bowel sounds present. No organomegaly or mass.  EXTREMITIES: No pedal edema, cyanosis, or clubbing.  NEUROLOGIC: Cranial nerves II through XII are intact. Muscle strength 5/5 in all extremities. Sensation intact. Gait not  checked.  PSYCHIATRIC: The patient is alert and oriented x 3.  SKIN: No obvious rash, lesion, or ulcer.   LABORATORY PANEL:   CBC  Recent Labs Lab 02/27/17 1524  WBC 4.8  HGB 5.8*  HCT 19.8*  PLT 300   ------------------------------------------------------------------------------------------------------------------  Chemistries   Recent Labs Lab 02/27/17 1524  NA 136  K 4.1  CL 106  CO2 22  GLUCOSE 101*  BUN 26*  CREATININE 2.42*  CALCIUM 8.7*  AST 39  ALT 9*  ALKPHOS 48  BILITOT 1.5*   ------------------------------------------------------------------------------------------------------------------  Cardiac Enzymes No results for input(s): TROPONINI in the last 168 hours. ------------------------------------------------------------------------------------------------------------------  RADIOLOGY:  No results found.  EKG:    IMPRESSION AND PLAN:   Aria Pickrell  is a 71 y.o. male with a known history of Atrial fibrillation/flutter on amiodarone and oral anticoagulation, hypertension, internal hemorrhoids and diverticulosis with on and off rectal bleed for several months comes to the emergency room where hemoglobin was found to be low at 5.8. His baseline hemoglobin is 11.3 in February 2018.  1. GI bleed appears lower need to rule out upper -Admit to medical floor -IV fluids -Type and screen and transfuse 1 unit of blood transfusion -Baseline hemoglobin 11.3 -  Came in with hemoglobin of 5.8--- one unit of blood transfusion----CBC tomorrow -Hold oral anticoagulation -Spoke with Dr. Tobi BastosAnna who will plan luminal evaluation in next couple days  2. Atrial flutter fibrillation -Continue amiodarone, metoprolol  3. GERD continue PPI  4. Hypertension continue metoprolol  5. DVT prophylaxis SCD and teds No antiplatelet agents secondary to GI bleed  No family the ER   All the records are reviewed and case discussed with ED provider. Management plans  discussed with the patient, family and they are in agreement.  CODE STATUS: Full  TOTAL TIME TAKING CARE OF THIS PATIENT: *45* minutes.    Joeanna Howdyshell M.D on 02/27/2017 at 4:49 PM  Between 7am to 6pm - Pager - [phone]  After 6pm go to [email or web address] - password [email or web address] [facility]  [facility]  Office  [phone]  CC: Primary care physician; Lauro RegulusAnderson, Marshall W, MD

## 2017-02-27 NOTE — ED Provider Notes (Signed)
[facility] Emergency Department Provider Note  ____________________________________________   I have reviewed the triage vital signs and the nursing notes.   HISTORY  Chief Complaint Anemia    HPI Edward [name] is a 71 y.o. male who presents today complaining of nothing except for the fact that he was told that he had to come in because he is anemic. He is not really complaining of any symptoms however. Patient states that he does have a history of "hemorrhoids" his last colonoscopy was he estimates 25 years ago. Seem like that would pre-well to him at the time he doesn't recall any pathology noted. Patient has a history however hemorrhoids he has been having repeated hematochezia for "a long time". He denies melena or hematemesis. He doesn't seem to be particularly symptomatically this anemia aside from the fact that he feels somewhat weak sometimes. No chest pain or shortness of breath. He is on blood thinners. As for her atrial fibrillation. Has had some bleeding today. He says usually when he had a bowel movement some blood passes as well.     Past Medical History:  Diagnosis Date  . Anemia   . Atrial fibrillation and flutter (HCC)   . GERD (gastroesophageal reflux disease)   . HTN (hypertension)   . Internal hemorrhoids with complication   . Sinoatrial node dysfunction [facility](HCC)     Patient Active Problem List   Diagnosis Date Noted  . Chronic diastolic heart failure (HCC) 07/14/2014  . Atrial fibrillation with RVR (HCC)   . Chronic anticoagulation   . SIRS (systemic inflammatory response syndrome) (HCC)   . Cellulitis of leg, left 07/09/2014  . Altered mental status   . Blood poisoning   . Sepsis (HCC) 07/08/2014  . Atrial fibrillation and flutter (HCC) 07/08/2014  . Left ventricular dysfunction 07/08/2014  . Hypothyroidism 07/08/2014  . Sick sinus syndrome (HCC) 07/08/2014  . Cardiac pacemaker 07/08/2014  . 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 Benign prostatic hyperplasia with urinary obstruction 05/02/2012    Past Surgical History:  Procedure Laterality Date  . CARDIOVERSION N/A 07/11/2014   Procedure: CARDIOVERSION;  Surgeon: Pricilla RifflePaula Ross V, MD;  [facility];  Service: Cardiovascular;  Laterality: N/A;  . HEMORROIDECTOMY    . PACEMAKER PLACEMENT    . TEE WITHOUT CARDIOVERSION N/A 07/11/2014   Procedure: TRANSESOPHAGEAL ECHOCARDIOGRAM (TEE);  Surgeon: Pricilla RifflePaula Ross V, MD;  [facility];  Service: Cardiovascular;  Laterality: N/A;    Prior to Admission medications   Medication Sig Start Date End Date Taking? Authorizing Provider  amiodarone (PACERONE) 200 MG tablet Take 0.5 tablets (100 mg total) by mouth 2 (two) times daily. Begin after you stop the 400 mg pill 07/15/14   Elenora GammaBradshaw, Samuel L, MD  amiodarone (PACERONE) 400 MG tablet Take 1 tablet twice daily for 1 week and then 1 tablet daily for 1 week 07/15/14   Elenora GammaBradshaw, Samuel L, MD  apixaban (ELIQUIS) 5 MG TABS tablet Take 1 tablet (5 mg total) by mouth 2 (two) times daily. 07/15/14   Elenora GammaBradshaw, Samuel L, MD  ferrous sulfate 325 (65 FE) MG tablet Take 1 tablet (325 mg total) by mouth daily with breakfast. 07/15/14   Elenora GammaBradshaw, Samuel L, MD  finasteride (PROSCAR) 5 MG tablet Take 5 mg by mouth every evening.  04/30/14   [provider]  KLOR-CON M10 10 MEQ tablet Take 10 mEq by mouth daily.  07/02/14   [provider]  metoprolol (LOPRESSOR) 50 MG tablet Take 1 tablet (50  mg total) by mouth 2 (two) times daily. 07/15/14   Elenora Gamma, MD  tamsulosin (FLOMAX) 0.4 MG CAPS capsule Take 0.4 mg by mouth daily.  04/30/14   [provider]  torsemide (DEMADEX) 5 MG tablet Take 1 tablet (5 mg total) by mouth daily. 07/15/14   Elenora Gamma, MD  vitamin B-12 (CYANOCOBALAMIN) 1000 MCG tablet Take 1,000 mcg by mouth daily.    [provider]    Allergies Penicillins  Family History  Problem Relation Age of Onset  .  Leukemia Mother     Social History Social History  Substance Use Topics  . Smoking status: Former Smoker    Types: Cigarettes    Quit date: 11/05/1965  . Smokeless tobacco: Never Used  . Alcohol use No    Review of Systems Constitutional: No fever/chills Eyes: No visual changes. ENT: No sore throat. No stiff neck no neck pain Cardiovascular: Denies chest pain. Respiratory: Denies shortness of breath. Gastrointestinal:   no vomiting.  No diarrhea.  No constipation. Genitourinary: Negative for dysuria. Musculoskeletal: Negative lower extremity swelling Skin: Negative for rash. Neurological: Negative for severe headaches, focal weakness or numbness.   ____________________________________________   PHYSICAL EXAM:  VITAL SIGNS: ED Triage Vitals [02/27/17 1456]  Enc Vitals Group     BP 103/88     Pulse Rate 76     Resp 18     Temp 98 F (36.7 C)     Temp Source Oral     SpO2 100 %     Weight 217 lb (98.4 kg)     Height 5\' 8"  (1.727 m)     Head Circumference      Peak Flow      Pain Score      Pain Loc      Pain Edu?      Excl. in GC?     Constitutional: Alert and oriented. Well appearing and in no acute distress.Very pale but in no acute distress Eyes: Conjunctivae are pale Head: Atraumatic HEENT: No congestion/rhinnorhea. Mucous membranes are moist.  Oropharynx non-erythematous Neck:   Nontender with no meningismus, no masses, no stridor Cardiovascular: Normal rate, regular rhythm. Grossly normal heart sounds.  Good peripheral circulation. Respiratory: Normal respiratory effort.  No retractions. Lungs CTAB. Abdominal: Soft and nontender. No distention. No guarding no rebound Back:  There is no focal tenderness or step off.  there is no midline tenderness there are no lesions noted. there is no CVA tenderness Rectal exam: External hemorrhoids noted, guaiac positive secretions no stool in the vault. No active bleeding at this moment. Musculoskeletal: No lower  extremity tenderness, no upper extremity tenderness. No joint effusions, no DVT signs strong distal pulses no edema Neurologic:  Normal speech and language. No gross focal neurologic deficits are appreciated.  Skin:  Skin is warm, dry and intact. No rash noted. Psychiatric: Mood and affect are normal. Speech and behavior are normal.  ____________________________________________   LABS (all labs ordered are listed, but only abnormal results are displayed)  Labs Reviewed  CBC WITH DIFFERENTIAL/PLATELET  COMPREHENSIVE METABOLIC PANEL  PROTIME-INR  TYPE AND SCREEN   ____________________________________________  EKG  I personally interpreted any EKGs ordered by me or triage  ____________________________________________  RADIOLOGY  I reviewed any imaging ordered by me or triage that were performed during my shift and, if possible, patient and/or family made aware of any abnormal findings. ____________________________________________   PROCEDURES  Procedure(s) performed: None  Procedures  Critical Care performed: CRITICAL CARE  Performed by: Jeanmarie Plant   Total critical care time: 38 minutes  Critical care time was exclusive of separately billable procedures and treating other patients.  Critical care was necessary to treat or prevent imminent or life-threatening deterioration.  Critical care was time spent personally by me on the following activities: development of treatment plan with patient and/or surrogate as well as nursing, discussions with consultants, evaluation of patient's response to treatment, examination of patient, obtaining history from patient or surrogate, ordering and performing treatments and interventions, ordering and review of laboratory studies, ordering and review of radiographic studies, pulse oximetry and re-evaluation of patient's condition.   ____________________________________________   INITIAL IMPRESSION / ASSESSMENT AND PLAN / ED  COURSE  Pertinent labs & imaging results that were available during my care of the patient were reviewed by me and considered in my medical decision making (see chart for details).  Patient with likely long chronic blood loss resulting in a significant anemia. He has guaiac positive rectal secretions but no obvious bleeding or stool at this moment. Patient's hemoglobin is significantly low but vital signs are reassuring suggestive a chronic course. We will recheck those results to verify them, then he will require transfusion and admission.    ____________________________________________   FINAL CLINICAL IMPRESSION(S) / ED DIAGNOSES  Final diagnoses:  None      This chart was dictated using voice recognition software.  Despite best efforts to proofread,  errors can occur which can change meaning.      Jeanmarie Plant, MD 02/27/17 [phone]

## 2017-02-28 DIAGNOSIS — D509 Iron deficiency anemia, unspecified: Secondary | ICD-10-CM

## 2017-02-28 DIAGNOSIS — K922 Gastrointestinal hemorrhage, unspecified: Secondary | ICD-10-CM

## 2017-02-28 LAB — CBC
HEMATOCRIT: 25.5 % — AB (ref 40.0–52.0)
Hemoglobin: 8.2 g/dL — ABNORMAL LOW (ref 13.0–18.0)
MCH: 24.7 pg — ABNORMAL LOW (ref 26.0–34.0)
MCHC: 31.9 g/dL — AB (ref 32.0–36.0)
MCV: 77.2 fL — AB (ref 80.0–100.0)
Platelets: 273 10*3/uL (ref 150–440)
RBC: 3.3 MIL/uL — ABNORMAL LOW (ref 4.40–5.90)
RDW: 15.5 % — AB (ref 11.5–14.5)
WBC: 4.3 10*3/uL (ref 3.8–10.6)

## 2017-02-28 LAB — FOLATE: FOLATE: 14.6 ng/mL (ref >5.9)

## 2017-02-28 LAB — BASIC METABOLIC PANEL
Anion gap: 6 (ref 5–15)
BUN: 22 mg/dL — ABNORMAL HIGH (ref 6–20)
CALCIUM: 8.9 mg/dL (ref 8.9–10.3)
CO2: 24 mmol/L (ref 22–32)
CREATININE: 2.07 mg/dL — AB (ref 0.61–1.24)
Chloride: 111 mmol/L (ref 101–111)
GFR calc Af Amer: 35 mL/min — ABNORMAL LOW (ref >60)
GFR calc non Af Amer: 30 mL/min — ABNORMAL LOW (ref >60)
GLUCOSE: 92 mg/dL (ref 65–99)
Potassium: 3.9 mmol/L (ref 3.5–5.1)
Sodium: 141 mmol/L (ref 135–145)

## 2017-02-28 LAB — IRON AND TIBC
Iron: 160 ug/dL (ref 45–182)
Saturation Ratios: 38 % (ref 17.9–39.5)
TIBC: 427 ug/dL (ref 250–450)
UIBC: 267 ug/dL

## 2017-02-28 LAB — VITAMIN B12: VITAMIN B 12: 2262 pg/mL — AB (ref 180–914)

## 2017-02-28 LAB — FERRITIN: FERRITIN: 10 ng/mL — AB (ref 24–336)

## 2017-02-28 MED ORDER — PANTOPRAZOLE SODIUM 40 MG IV SOLR
40.0000 mg | Freq: Two times a day (BID) | INTRAVENOUS | Status: DC
  Administered 2017-02-28 – 2017-03-02 (×5): 40 mg via INTRAVENOUS
  Filled 2017-02-28 (×6): qty 40

## 2017-02-28 NOTE — Care Management (Signed)
Admitted to [facility] with the diagnosis of GI Bleed. Lives alone. Friend is Starla LinkChristine Feldspar [phone]). Last seen Dr. Dareen PianoAnderson about a week ago. [facility] [address].  Home Health about 1.5 years ago, Can't remember name of agency. No skilled facility. No home oxygen. Wheelchair, canes  and rolling walker in the home. Takes care of all basic activities of daily living himself, drives.  GI work up in progress. Gwenette GreetBrenda S Dmario Russom RN MSN CCM Care Management [phone]

## 2017-02-28 NOTE — Consult Note (Signed)
Wyline Mood MD, MRCP(U.K) [address]  [address]  [place], [place] [address]  Main: [phone]  Fax: [phone]  Consultation  Referring Provider:    Dr Allena Katz  Primary Care Physician:  Lauro Regulus, MD Primary Gastroenterologist:           Reason for Consultation:     GI bleed   Date of Admission:  02/27/2017 Date of Consultation:  02/28/2017         HPI:   Edward [name] is a 71 y.o. male who has a history of A fibb on Eloquis came into the ER when on routine labs was found to have a low Hb of 5.8 grams. His baseline has been 11.3 when checked in 10/2016 .   He says that he has had blood in his stool,toilet paper and bowel for 2 years , last recalls having a colonoscopy/EGD back in the 90's. Denies use of any NSAID's, took last dose of Eloquis yesterday . Denies any hematemesis, abdominal pain.  Presently comfortable .   Past Medical History:  Diagnosis Date  . Anemia   . Atrial fibrillation and flutter (HCC)   . GERD (gastroesophageal reflux disease)   . HTN (hypertension)   . Internal hemorrhoids with complication   . Sinoatrial node dysfunction (HCC)     Past Surgical History:  Procedure Laterality Date  . CARDIOVERSION N/A 07/11/2014   Procedure: CARDIOVERSION;  Surgeon: Pricilla Riffle, MD;  Location: [facility] ENDOSCOPY;  Service: Cardiovascular;  Laterality: N/A;  . HEMORROIDECTOMY    . PACEMAKER PLACEMENT    . TEE WITHOUT CARDIOVERSION N/A 07/11/2014   Procedure: TRANSESOPHAGEAL ECHOCARDIOGRAM (TEE);  Surgeon: Pricilla Riffle, MD;  Location: [facility] ENDOSCOPY;  Service: Cardiovascular;  Laterality: N/A;    Prior to Admission medications   Medication Sig Start Date End Date Taking? Authorizing Provider  amiodarone (PACERONE) 200 MG tablet Take 0.5 tablets (100 mg total) by mouth 2 (two) times daily. Begin after you stop the 400 mg pill Patient taking differently: Take 100 mg by mouth daily.  07/15/14  Yes Elenora Gamma, MD  apixaban (ELIQUIS) 5 MG TABS  tablet Take 1 tablet (5 mg total) by mouth 2 (two) times daily. 07/15/14  Yes Elenora Gamma, MD  ferrous sulfate 325 (65 FE) MG tablet Take 1 tablet (325 mg total) by mouth daily with breakfast. 07/15/14  Yes Elenora Gamma, MD  KLOR-CON M10 10 MEQ tablet Take 10 mEq by mouth daily.  07/02/14  Yes [provider]  levothyroxine (SYNTHROID, LEVOTHROID) 25 MCG tablet Take 25 mcg by mouth daily. 02/14/17  Yes [provider]  metoprolol (LOPRESSOR) 50 MG tablet Take 1 tablet (50 mg total) by mouth 2 (two) times daily. 07/15/14  Yes Elenora Gamma, MD  torsemide (DEMADEX) 5 MG tablet Take 1 tablet (5 mg total) by mouth daily. Patient taking differently: Take 10 mg by mouth daily.  07/15/14  Yes Elenora Gamma, MD  vitamin B-12 (CYANOCOBALAMIN) 1000 MCG tablet Take 1,000 mcg by mouth daily.   Yes [provider]    Family History  Problem Relation Age of Onset  . Leukemia Mother      Social History  Substance Use Topics  . Smoking status: Former Smoker    Types: Cigarettes    Quit date: 11/05/1965  . Smokeless tobacco: Never Used  . Alcohol use No    Allergies as of 02/27/2017 - Review Complete 02/27/2017  Allergen Reaction Noted  . Penicillins  Rash 07/08/2014    Review of Systems:    All systems reviewed and negative except where noted in HPI.   Physical Exam:  Vital signs in last 24 hours: Temp:  [97.8 F (36.6 C)-98.3 F (36.8 C)] 98.1 F (36.7 C) (06/26 0305) Pulse Rate:  [43-108] 46 (06/26 0917) Resp:  [12-20] 18 (06/26 0305) BP: (102-132)/(52-88) 116/54 (06/26 0917) SpO2:  [98 %-100 %] 98 % (06/26 0917) Weight:  [198 lb 0.4 oz (89.8 kg)-217 lb (98.4 kg)] 198 lb 0.4 oz (89.8 kg) (06/25 1900) Last BM Date: 02/26/17 (per pt) General:   Pleasant, cooperative in NAD Head:  Normocephalic and atraumatic. Eyes:   No icterus.   Conjunctiva pink. PERRLA. Ears:  Normal auditory acuity. Neck:  Supple; no masses or thyroidomegaly Lungs:  Respirations even and unlabored. Lungs clear to auscultation bilaterally.   No wheezes, crackles, or rhonchi.  Heart:  Regular rate and rhythm;  Without murmur, clicks, rubs or gallops Abdomen:  Soft, nondistended, nontender. Normal bowel sounds. No appreciable masses or hepatomegaly.  No rebound or guarding.  Rectal:  Not performed. Neurologic:  Alert and oriented x3;  grossly normal neurologically. Skin:  Intact without significant lesions or rashes. Psych:  Alert and cooperative. Normal affect.  LAB RESULTS:  Recent Labs  02/27/17 1524 02/28/17 0432  WBC 4.8 4.3  HGB 5.8* 8.2*  HCT 19.8* 25.5*  PLT 300 273   BMET  Recent Labs  02/27/17 1524 02/28/17 0432  NA 136 141  K 4.1 3.9  CL 106 111  CO2 22 24  GLUCOSE 101* 92  BUN 26* 22*  CREATININE 2.42* 2.07*  CALCIUM 8.7* 8.9   LFT  Recent Labs  02/27/17 1524  PROT 6.7  ALBUMIN 4.0  AST 39  ALT 9*  ALKPHOS 48  BILITOT 1.5*   PT/INR  Recent Labs  02/27/17 1524  LABPROT 21.0*  INR 1.79    STUDIES: No results found.    Impression / Plan:   [name] is a 71 y.o. y/o male with acute onset anemia incidentally found on routine labs. HB  5.8 grams with mcv 75 .   Impression : likely acute on chronic microcytic anemia .H/o rectal bleeding    Plan  1. Hold eloquis and when off for 2 days will plan for EGD+colonoscopy . If negative will need capsule study - will plan for Thursday morning  2. Check iron studies, b12,folate ,tsh    I have discussed alternative options, risks & benefits,  which include, but are not limited to, bleeding, infection, perforation,respiratory complication & drug reaction.  The patient agrees with this plan & written consent will be obtained.     Thank you for involving me in the care of this patient.      LOS: 1 day   Wyline MoodKiran Gatha Mcnulty, MD  02/28/2017, 9:35 AM

## 2017-02-28 NOTE — Evaluation (Signed)
Physical Therapy Evaluation Patient Details Name: Edward [name] MRN: [id number] DOB: Feb 25, 1946 Today's Date: 02/28/2017   History of Present Illness  Edward [name]  is a 71 y.o. male with a known history of atrial fibrillation/flutter on amiodarone and oral anticoagulation, hypertension, internal hemorrhoids and diverticulosis with on and off rectal bleed for several months comes to the emergency room where hemoglobin was found to be low at 5.8. His baseline hemoglobin is 11.3 in February 2018. Patient denies any abdominal pain. He denies any severe bleeding. He states he has history of constipation and his hemorrhoids act up and he starts bleeding. Pt received 1 unit of blood transfusion. It was also noted his creatinine is up to 2.47. Baseline creatinine around 1.3. He is now admitted for GIB and is awaiting further work-up  Clinical Impression  Pt demonstrates independence with bed mobility and transfers. Supervision only for ambulation without an assistive device. He is able to ambulate partially around RN station. Slightly decreased step length and gait speed but functional for limited community distances. Able to perform horizontal and vertical head turns, gait speed changes, and 180 degree turns without loss of balance or staggering. No DOE or dizziness reported during ambulation. Pt with some higher level balance deficits in tandem and single leg stance. He would benefit from OP PT for balance to reduce fall risk but no further acute PT needs identified to be addressed during current admission. Will complete order. Please enter new order if status or needs change.    Follow Up Recommendations Outpatient PT;Other (comment) (For balance)    Equipment Recommendations  None recommended by PT    Recommendations for Other Services       Precautions / Restrictions Precautions Precautions: Fall Restrictions Weight Bearing Restrictions: No      Mobility  Bed Mobility Overal bed  mobility: Independent             General bed mobility comments: Good speed/sequencing  Transfers Overall transfer level: Independent Equipment used: None             General transfer comment: Good speed/sequencing/stability  Ambulation/Gait Ambulation/Gait assistance: Supervision Ambulation Distance (Feet): 150 Feet Assistive device: None Gait Pattern/deviations: Decreased step length - right;Decreased step length - left     General Gait Details: Pt able to ambulate partially around RN station. Slightly decreased step length and gait speed but functional for limited community distances. Able to perform horizontal and vertical head turns, gait speed changes, and 180 degree turns without loss of balance or staggering. No DOE or dizziness reported during ambulation.   Stairs            Wheelchair Mobility    Modified Rankin (Stroke Patients Only)       Balance Overall balance assessment: Needs assistance Sitting-balance support: No upper extremity supported Sitting balance-Leahy Scale: Good     Standing balance support: No upper extremity supported Standing balance-Leahy Scale: Fair Standing balance comment: Good feet apart/together balance. Negative Rhomberg for LOB but mild increase in sway. Single leg balance is 2-3 seconds. Able to achieve tandem stance but falls within 2-3 seconds                             Pertinent Vitals/Pain Pain Assessment: No/denies pain    Home Living Family/patient expects to be discharged to:: Private residence Living Arrangements: Alone Available Help at Discharge: Other (Comment) (None) Type of Home: Apartment Home Access: Level entry  Home Layout: One level Home Equipment: Cane - single point;Walker - 2 wheels;Wheelchair - manual;Grab bars - tub/shower (Lift chair, no BSC or shower chair)      Prior Function Level of Independence: Independent         Comments: Independent with ADLs/IADLs. No  falls in the last 12 months     Hand Dominance   Dominant Hand: Right    Extremity/Trunk Assessment   Upper Extremity Assessment Upper Extremity Assessment: Overall WFL for tasks assessed    Lower Extremity Assessment Lower Extremity Assessment: Overall WFL for tasks assessed       Communication   Communication: No difficulties  Cognition Arousal/Alertness: Awake/alert Behavior During Therapy: WFL for tasks assessed/performed Overall Cognitive Status: Within Functional Limits for tasks assessed                                 General Comments: Slight delay sometimes in answering questions but able to answer all questions appropriately      General Comments      Exercises     Assessment/Plan    PT Assessment Patent does not need any further PT services  PT Problem List         PT Treatment Interventions      PT Goals (Current goals can be found in the Care Plan section)  Acute Rehab PT Goals PT Goal Formulation: All assessment and education complete, DC therapy    Frequency     Barriers to discharge        Co-evaluation               AM-PAC PT "6 Clicks" Daily Activity  Outcome Measure Difficulty turning over in bed (including adjusting bedclothes, sheets and blankets)?: None Difficulty moving from lying on back to sitting on the side of the bed? : None Difficulty sitting down on and standing up from a chair with arms (e.g., wheelchair, bedside commode, etc,.)?: None Help needed moving to and from a bed to chair (including a wheelchair)?: None Help needed walking in hospital room?: None Help needed climbing 3-5 steps with a railing? : A Little 6 Click Score: 23    End of Session Equipment Utilized During Treatment: Gait belt Activity Tolerance: Patient tolerated treatment well Patient left: in bed;with call bell/phone within reach;with bed alarm set Nurse Communication: Mobility status PT Visit Diagnosis: Unsteadiness on feet  (R26.81);Muscle weakness (generalized) (M62.81)    Time: [id number] PT Time Calculation (min) (ACUTE ONLY): 14 min   Charges:   PT Evaluation $PT Eval Low Complexity: 1 Procedure     PT G Codes:   PT G-Codes **NOT FOR INPATIENT CLASS** Functional Assessment Tool Used: AM-PAC 6 Clicks Basic Mobility Functional Limitation: Mobility: Walking and moving around Mobility: Walking and Moving Around Current Status ([id number](G8978): At least 1 percent but less than 20 percent impaired, limited or restricted Mobility: Walking and Moving Around Goal Status [phone](G8979): At least 1 percent but less than 20 percent impaired, limited or restricted Mobility: Walking and Moving Around Discharge Status [phone](G8980): At least 1 percent but less than 20 percent impaired, limited or restricted    Lynnea MaizesJason D Emersen Carroll PT, DPT    Deondrae Mcgrail 02/28/2017, 12:02 PM

## 2017-02-28 NOTE — Progress Notes (Signed)
[facility] [place] at [facility]   PATIENT NAME: Edward [name]    MR#:  [id number]  DATE OF BIRTH:  1945-09-28  SUBJECTIVE:  CHIEF COMPLAINT:   Chief Complaint  Patient presents with  . Anemia     Percent by PMD because of low hemoglobin. On anticoagulation due to A. fib. 2 units of transfusion received overnight and hemoglobin came up. Denies any complaints today.  REVIEW OF SYSTEMS:  CONSTITUTIONAL: No fever, fatigue or weakness.  EYES: No blurred or double vision.  EARS, NOSE, AND THROAT: No tinnitus or ear pain.  RESPIRATORY: No cough, shortness of breath, wheezing or hemoptysis.  CARDIOVASCULAR: No chest pain, orthopnea, edema.  GASTROINTESTINAL: No nausea, vomiting, diarrhea or abdominal pain.  GENITOURINARY: No dysuria, hematuria.  ENDOCRINE: No polyuria, nocturia,  HEMATOLOGY: No anemia, easy bruising or bleeding SKIN: No rash or lesion. MUSCULOSKELETAL: No joint pain or arthritis.   NEUROLOGIC: No tingling, numbness, weakness.  PSYCHIATRY: No anxiety or depression.   ROS  DRUG ALLERGIES:   Allergies  Allergen Reactions  . Penicillins Rash    Patient rechallenged with Zosyn in Nov 2015 and developed abdominal rash within 12 hours.  .Has patient had a PCN reaction causing immediate rash, facial/tongue/throat swelling, SOB or lightheadedness with hypotension: No Has patient had a PCN reaction causing severe rash involving mucus membranes or skin necrosis: No Has patient had a PCN reaction that required hospitalization: No Has patient had a PCN reaction occurring within the last 10 years: No If all of the above answers are "NO", then may     VITALS:  Blood pressure (!) 127/44, pulse 67, temperature 97.7 F (36.5 C), temperature source Oral, resp. rate 18, height 5\' 8"  (1.727 m), weight 89.8 kg (198 lb 0.4 oz), SpO2 100 %.  PHYSICAL EXAMINATION:  GENERAL:  71 y.o.-year-old patient lying in the bed with no acute distress.  EYES: Pupils  equal, round, reactive to light and accommodation. No scleral icterus. Extraocular muscles intact.  HEENT: Head atraumatic, normocephalic. Oropharynx and nasopharynx clear.  NECK:  Supple, no jugular venous distention. No thyroid enlargement, no tenderness.  LUNGS: Normal breath sounds bilaterally, no wheezing, rales,rhonchi or crepitation. No use of accessory muscles of respiration.  CARDIOVASCULAR: S1, S2 normal. No murmurs, rubs, or gallops.  ABDOMEN: Soft, nontender, nondistended. Bowel sounds present. No organomegaly or mass.  EXTREMITIES: No pedal edema, cyanosis, or clubbing.  NEUROLOGIC: Cranial nerves II through XII are intact. Muscle strength 5/5 in all extremities. Sensation intact. Gait not checked.  PSYCHIATRIC: The patient is alert and oriented x 3.  SKIN: No obvious rash, lesion, or ulcer.   Physical Exam LABORATORY PANEL:   CBC  Recent Labs Lab 02/28/17 0432  WBC 4.3  HGB 8.2*  HCT 25.5*  PLT 273   ------------------------------------------------------------------------------------------------------------------  Chemistries   Recent Labs Lab 02/27/17 1524 02/28/17 0432  NA 136 141  K 4.1 3.9  CL 106 111  CO2 22 24  GLUCOSE 101* 92  BUN 26* 22*  CREATININE 2.42* 2.07*  CALCIUM 8.7* 8.9  AST 39  --   ALT 9*  --   ALKPHOS 48  --   BILITOT 1.5*  --    ------------------------------------------------------------------------------------------------------------------  Cardiac Enzymes No results for input(s): TROPONINI in the last 168 hours. ------------------------------------------------------------------------------------------------------------------  RADIOLOGY:  No results found.  ASSESSMENT AND PLAN:   Active Problems:   GI bleed  Edward [name]  is a 71 y.o. male with a known history of Atrial fibrillation/flutter on amiodarone and  oral anticoagulation, hypertension, internal hemorrhoids and diverticulosis with on and off rectal bleed for  several months comes to the emergency room where hemoglobin was found to be low at 5.8. His baseline hemoglobin is 11.3 in February 2018.  1. GI bleed appears lower need to rule out upper -IV fluids -Type and screen and transfused 2 unit of blood transfusion -Baseline hemoglobin 11.3- now came up to 8 -Came in with hemoglobin of 5.8 -Hold oral anticoagulation - awaited GI consult, Kept NPO since night but as it is late today- will give him clear liquid diet and keep NPO from Midnight.  2. Atrial flutter fibrillation -Continue amiodarone, metoprolol   HR was running low, and BP normal- so held both of these in morning today.  3. GERD continue PPI  4. Hypertension - held metoprolol  5. DVT prophylaxis SCD and teds No antiplatelet agents secondary to GI bleed  6. CKD stage- 3 slight worse on admissison, improved with iv fluids.  All the records are reviewed and case discussed with Care Management/Social Workerr. Management plans discussed with the patient, family and they are in agreement.  CODE STATUS: Full.  TOTAL TIME TAKING CARE OF THIS PATIENT: 35 minutes.    POSSIBLE D/C IN 1-2 DAYS, DEPENDING ON CLINICAL CONDITION.   Altamese DillingVACHHANI, Jabree Rebert M.D on 02/28/2017   Between 7am to 6pm - Pager - [phone]  After 6pm go to [email or web address] - Social research officer, governmentpassword [email or web address]  [facility]  Office  [phone]  CC: Primary care physician; Lauro RegulusAnderson, Marshall W, MD  Note: This dictation was prepared with Dragon dictation along with smaller phrase technology. Any transcriptional errors that result from this process are unintentional.

## 2017-03-01 LAB — TYPE AND SCREEN
ABO/RH(D): A NEG
Antibody Screen: NEGATIVE
UNIT DIVISION: 0
Unit division: 0
Unit division: 0

## 2017-03-01 LAB — BPAM RBC
BLOOD PRODUCT EXPIRATION DATE: 201806292359
BLOOD PRODUCT EXPIRATION DATE: 201807052359
Blood Product Expiration Date: 201806302359
ISSUE DATE / TIME: 201806251725
ISSUE DATE / TIME: 201806252258
ISSUE DATE / TIME: 201806260931
UNIT TYPE AND RH: 600
Unit Type and Rh: 9500
Unit Type and Rh: 9500

## 2017-03-01 LAB — BASIC METABOLIC PANEL
ANION GAP: 6 (ref 5–15)
BUN: 15 mg/dL (ref 6–20)
CO2: 25 mmol/L (ref 22–32)
Calcium: 8.6 mg/dL — ABNORMAL LOW (ref 8.9–10.3)
Chloride: 108 mmol/L (ref 101–111)
Creatinine, Ser: 2.06 mg/dL — ABNORMAL HIGH (ref 0.61–1.24)
GFR calc Af Amer: 35 mL/min — ABNORMAL LOW (ref >60)
GFR calc non Af Amer: 31 mL/min — ABNORMAL LOW (ref >60)
Glucose, Bld: 97 mg/dL (ref 65–99)
POTASSIUM: 3.4 mmol/L — AB (ref 3.5–5.1)
SODIUM: 139 mmol/L (ref 135–145)

## 2017-03-01 LAB — CBC
HCT: 25.4 % — ABNORMAL LOW (ref 40.0–52.0)
Hemoglobin: 7.9 g/dL — ABNORMAL LOW (ref 13.0–18.0)
MCH: 23.7 pg — ABNORMAL LOW (ref 26.0–34.0)
MCHC: 31.1 g/dL — ABNORMAL LOW (ref 32.0–36.0)
MCV: 76.2 fL — ABNORMAL LOW (ref 80.0–100.0)
PLATELETS: 273 10*3/uL (ref 150–440)
RBC: 3.34 MIL/uL — AB (ref 4.40–5.90)
RDW: 15.8 % — ABNORMAL HIGH (ref 11.5–14.5)
WBC: 5.5 10*3/uL (ref 3.8–10.6)

## 2017-03-01 MED ORDER — SODIUM CHLORIDE 0.9 % IV SOLN
INTRAVENOUS | Status: DC
  Administered 2017-03-02: 20 mL/h via INTRAVENOUS
  Administered 2017-03-02: 11:00:00 via INTRAVENOUS

## 2017-03-01 MED ORDER — POTASSIUM CHLORIDE CRYS ER 20 MEQ PO TBCR
30.0000 meq | EXTENDED_RELEASE_TABLET | Freq: Once | ORAL | Status: AC
  Administered 2017-03-01: 30 meq via ORAL
  Filled 2017-03-01: qty 1

## 2017-03-01 MED ORDER — PEG 3350-KCL-NA BICARB-NACL 420 G PO SOLR
4000.0000 mL | Freq: Once | ORAL | Status: AC
  Administered 2017-03-01: 4000 mL via ORAL
  Filled 2017-03-01: qty 4000

## 2017-03-01 NOTE — Progress Notes (Signed)
[facility] - [place] at [facility]   PATIENT NAME: Edward [name]    MR#:  [id number]  DATE OF BIRTH:  11-Jun-1946  SUBJECTIVE:  No complaints  REVIEW OF SYSTEMS:   Review of Systems  Constitutional: Negative for chills, fever and weight loss.  HENT: Negative for ear discharge, ear pain and nosebleeds.   Eyes: Negative for blurred vision, pain and discharge.  Respiratory: Negative for sputum production, shortness of breath, wheezing and stridor.   Cardiovascular: Negative for chest pain, palpitations, orthopnea and PND.  Gastrointestinal: Negative for abdominal pain, diarrhea, nausea and vomiting.  Genitourinary: Negative for frequency and urgency.  Musculoskeletal: Negative for back pain and joint pain.  Neurological: Positive for weakness. Negative for sensory change, speech change and focal weakness.  Psychiatric/Behavioral: Negative for depression and hallucinations. The patient is not nervous/anxious.    Tolerating Diet: Tolerating PT:   DRUG ALLERGIES:   Allergies  Allergen Reactions  . Penicillins Rash    Patient rechallenged with Zosyn in Nov 2015 and developed abdominal rash within 12 hours.  .Has patient had a PCN reaction causing immediate rash, facial/tongue/throat swelling, SOB or lightheadedness with hypotension: No Has patient had a PCN reaction causing severe rash involving mucus membranes or skin necrosis: No Has patient had a PCN reaction that required hospitalization: No Has patient had a PCN reaction occurring within the last 10 years: No If all of the above answers are "NO", then may     VITALS:  Blood pressure (!) 110/49, pulse 62, temperature 97.8 F (36.6 C), temperature source Oral, resp. rate 18, height 5\' 8"  (1.727 m), weight 89.8 kg (198 lb 0.4 oz), SpO2 99 %.  PHYSICAL EXAMINATION:   Physical Exam  GENERAL:  71 y.o.-year-old patient lying in the bed with no acute distress.  EYES: Pupils equal, round, reactive to  light and accommodation. No scleral icterus. Extraocular muscles intact.  HEENT: Head atraumatic, normocephalic. Oropharynx and nasopharynx clear. Pallor+ NECK:  Supple, no jugular venous distention. No thyroid enlargement, no tenderness.  LUNGS: Normal breath sounds bilaterally, no wheezing, rales, rhonchi. No use of accessory muscles of respiration.  CARDIOVASCULAR: S1, S2 normal. No murmurs, rubs, or gallops.  ABDOMEN: Soft, nontender, nondistended. Bowel sounds present. No organomegaly or mass.  EXTREMITIES: No cyanosis, clubbing or edema b/l.    NEUROLOGIC: Cranial nerves II through XII are intact. No focal Motor or sensory deficits b/l.   PSYCHIATRIC:  patient is alert and oriented x 3.  SKIN: No obvious rash, lesion, or ulcer.   LABORATORY PANEL:  CBC  Recent Labs Lab 03/01/17 0449  WBC 5.5  HGB 7.9*  HCT 25.4*  PLT 273    Chemistries   Recent Labs Lab 02/27/17 1524  03/01/17 0449  NA 136  < > 139  K 4.1  < > 3.4*  CL 106  < > 108  CO2 22  < > 25  GLUCOSE 101*  < > 97  BUN 26*  < > 15  CREATININE 2.42*  < > 2.06*  CALCIUM 8.7*  < > 8.6*  AST 39  --   --   ALT 9*  --   --   ALKPHOS 48  --   --   BILITOT 1.5*  --   --   < > = values in this interval not displayed. Cardiac Enzymes No results for input(s): TROPONINI in the last 168 hours. RADIOLOGY:  No results found. 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 His baseline hemoglobin is 11.3 in February 2018.  1. GI bleed appears lower need to rule out upper -received IV fluids---now d/ced -Type and screen and transfused 2 unit of blood transfusion -Baseline hemoglobin 11.3 -Came in with hemoglobin of 5.8---2 units---8.2--7.9 -Hold  oral anticoagulation -  GI consult noted. EGD/colonoscopy in am -needs to be off eliquis for 48 hours - will give him clear liquid diet and keep NPO from Midnight.  2. Atrial flutter fibrillation -Continue amiodarone,metoprolol   HR was running low, and BP normal- so held both of these in morning today.  3. GERD continue PPI  4. Hypertension - held metoprolol  5. DVT prophylaxis SCD and teds No antiplatelet agents secondary to GI bleed  6. CKD stage- 3 slight worse on admissison, improved with iv fluids.   Case discussed with Care Management/Social Worker. Management plans discussed with the patient, family and they are in agreement.  CODE STATUS: FULL  DVT Prophylaxis: SCD TOTAL TIME TAKING CARE OF THIS PATIENT: *25* minutes.  >50% time spent on counselling and coordination of care  POSSIBLE D/C IN 1-2 DAYS, DEPENDING ON CLINICAL CONDITION.  Note: This dictation was prepared with Dragon dictation along with smaller phrase technology. Any transcriptional errors that result from this process are unintentional.  Deasia Chiu M.D on 03/01/2017 at 9:22 AM  Between 7am to 6pm - Pager - [phone]  After 6pm go to [email or web address] - Social research officer, governmentpassword [email or web address]  [facility] [place] [facility]  Office  [phone]  CC: Primary care physician; Lauro RegulusAnderson, Marshall W, MD

## 2017-03-01 NOTE — Progress Notes (Signed)
   Edward MoodKiran Lakoda Mcanany MD, MRCP(U.K) [address]  [address]  [place], [place] [address]  Main: [phone]    Edward [name] is being followed for iron deficiency anemia  Day 13 of follow up   Subjective: Had a small bowel movement with some blood in it    Objective: Vital signs in last 24 hours: Vitals:   03/01/17 0500 03/01/17 0818 03/01/17 1002 03/01/17 1222  BP: (!) 120/51 (!) 110/49 (!) 125/52 (!) 118/52  Pulse: 72 62  75  Resp: 20 18  18   Temp: 98.3 F (36.8 C) 97.8 F (36.6 C)  98.2 F (36.8 C)  TempSrc: Oral Oral  Oral  SpO2: 100% 99%  99%  Weight:      Height:       Weight change:   Intake/Output Summary (Last 24 hours) at 03/01/17 1636 Last data filed at 03/01/17 1635  Gross per 24 hour  Intake             2000 ml  Output             1700 ml  Net              300 ml     Exam: Heart:: Regular rate and rhythm, S1S2 present or without murmur or extra heart sounds Lungs: normal, clear to auscultation and clear to auscultation and percussion Abdomen:  soft, nontender, normal bowel sounds  Lab Results: CBC Latest Ref Rng & Units 03/01/2017 02/28/2017 02/27/2017  WBC 3.8 - 10.6 K/uL 5.5 4.3 4.8  Hemoglobin 13.0 - 18.0 g/dL 7.9(L) 8.2(L) 5.8(L)  Hematocrit 40.0 - 52.0 % 25.4(L) 25.5(L) 19.8(L)  Platelets 150 - 440 K/uL 273 273 300    Micro Results: No results found for this or any previous visit (from the past 240 hour(s)). Studies/Results: No results found. Medications: I have reviewed the patient's current medications. Scheduled Meds: . amiodarone  100 mg Oral Daily  . ferrous sulfate  325 mg Oral Q breakfast  . levothyroxine  25 mcg Oral Daily  . metoprolol tartrate  50 mg Oral BID  . pantoprazole (PROTONIX) IV  40 mg Intravenous Q12H  . polyethylene glycol-electrolytes  4,000 mL Oral Once  . potassium chloride  10 mEq Oral Daily  . senna  1 tablet Oral BID  . torsemide  10 mg Oral Daily  . vitamin B-12  1,000 mcg Oral Daily   Continuous  Infusions: . sodium chloride     PRN Meds:.acetaminophen **OR** acetaminophen, ondansetron **OR** ondansetron (ZOFRAN) IV, polyethylene glycol   Assessment: Active Problems:   GI bleed  Edward Setterslbert C Bellis is a 71 y.o. y/o male with acute onset anemia incidentally found on routine labs. HB  5.8 grams with mcv 75 .   Impression : likely acute on chronic microcytic anemia .H/o rectal bleeding    Plan  1.  EGD+colonoscopy tomorrow  . If negative will need capsule study  2. Ferritin very low- suggest dose of iv iron    I have discussed alternative options, risks & benefits,  which include, but are not limited to, bleeding, infection, perforation,respiratory complication & drug reaction.  The patient agrees with this plan & written consent will be obtained.      LOS: 2 days   Edward [name] 03/01/2017, 4:36 PM

## 2017-03-02 ENCOUNTER — Inpatient Hospital Stay: Payer: Medicare HMO | Admitting: Anesthesiology

## 2017-03-02 ENCOUNTER — Encounter
Admission: EM | Disposition: A | Discharge status: Home Health | Payer: Self-pay | Source: Home / Self Care | Attending: Internal Medicine

## 2017-03-02 DIAGNOSIS — K5730 Diverticulosis of large intestine without perforation or abscess without bleeding: Secondary | ICD-10-CM

## 2017-03-02 DIAGNOSIS — K2970 Gastritis, unspecified, without bleeding: Secondary | ICD-10-CM

## 2017-03-02 DIAGNOSIS — K640 First degree hemorrhoids: Secondary | ICD-10-CM

## 2017-03-02 HISTORY — PX: ESOPHAGOGASTRODUODENOSCOPY: SHX5428

## 2017-03-02 HISTORY — PX: COLONOSCOPY WITH PROPOFOL: SHX5780

## 2017-03-02 SURGERY — EGD (ESOPHAGOGASTRODUODENOSCOPY)
Anesthesia: General

## 2017-03-02 MED ORDER — SODIUM CHLORIDE 0.9 % IV SOLN
400.0000 mg | Freq: Once | INTRAVENOUS | Status: AC
  Administered 2017-03-02: 400 mg via INTRAVENOUS
  Filled 2017-03-02: qty 20

## 2017-03-02 MED ORDER — GLYCOPYRROLATE 0.2 MG/ML IJ SOLN
INTRAMUSCULAR | Status: AC
  Filled 2017-03-02: qty 1

## 2017-03-02 MED ORDER — EPHEDRINE SULFATE 50 MG/ML IJ SOLN
INTRAMUSCULAR | Status: DC | PRN
  Administered 2017-03-02: 10 mg via INTRAVENOUS
  Administered 2017-03-02: 15 mg via INTRAVENOUS
  Administered 2017-03-02: 10 mg via INTRAVENOUS

## 2017-03-02 MED ORDER — PHENYLEPHRINE HCL 10 MG/ML IJ SOLN
INTRAMUSCULAR | Status: DC | PRN
  Administered 2017-03-02 (×2): 100 ug via INTRAVENOUS

## 2017-03-02 MED ORDER — PROPOFOL 10 MG/ML IV BOLUS
INTRAVENOUS | Status: DC | PRN
  Administered 2017-03-02: 70 mg via INTRAVENOUS
  Administered 2017-03-02: 20 mg via INTRAVENOUS

## 2017-03-02 MED ORDER — LIDOCAINE HCL (PF) 2 % IJ SOLN
INTRAMUSCULAR | Status: AC
  Filled 2017-03-02: qty 2

## 2017-03-02 MED ORDER — PANTOPRAZOLE SODIUM 40 MG PO TBEC: 40.0000 mg | DELAYED_RELEASE_TABLET | Freq: Every day | ORAL | Status: DC

## 2017-03-02 MED ORDER — METOPROLOL TARTRATE 25 MG PO TABS: 12.5000 mg | ORAL_TABLET | Freq: Two times a day (BID) | ORAL | Status: DC

## 2017-03-02 MED ORDER — FERROUS SULFATE 325 (65 FE) MG PO TABS
325.0000 mg | ORAL_TABLET | Freq: Three times a day (TID) | ORAL | Status: DC
  Administered 2017-03-02: 325 mg via ORAL
  Filled 2017-03-02: qty 1

## 2017-03-02 MED ORDER — METOPROLOL TARTRATE 25 MG PO TABS: 12.5000 mg | ORAL_TABLET | Freq: Two times a day (BID) | ORAL | 0 refills | Status: AC

## 2017-03-02 MED ORDER — PROPOFOL 500 MG/50ML IV EMUL
INTRAVENOUS | Status: DC | PRN
  Administered 2017-03-02: 150 ug/kg/min via INTRAVENOUS

## 2017-03-02 MED ORDER — PROPOFOL 500 MG/50ML IV EMUL
INTRAVENOUS | Status: AC
  Filled 2017-03-02: qty 50

## 2017-03-02 MED ORDER — FERROUS SULFATE 325 (65 FE) MG PO TABS: 325.0000 mg | ORAL_TABLET | Freq: Two times a day (BID) | ORAL | 3 refills | Status: AC

## 2017-03-02 MED ORDER — PANTOPRAZOLE SODIUM 40 MG PO TBEC: 40.0000 mg | DELAYED_RELEASE_TABLET | Freq: Every day | ORAL | 1 refills | Status: DC

## 2017-03-02 MED ORDER — POLYSACCHARIDE IRON COMPLEX 150 MG PO CAPS: 150.0000 mg | ORAL_CAPSULE | Freq: Every day | ORAL | Status: DC

## 2017-03-02 MED ORDER — LIDOCAINE HCL (CARDIAC) 20 MG/ML IV SOLN
INTRAVENOUS | Status: DC | PRN
  Administered 2017-03-02: 40 mg via INTRAVENOUS

## 2017-03-02 MED ORDER — PROPOFOL 10 MG/ML IV BOLUS
INTRAVENOUS | Status: AC
  Filled 2017-03-02: qty 20

## 2017-03-02 NOTE — H&P (Signed)
Edward [name] Hence Derrick MD [address] [place], [place] [address] Phone: [phone] Fax : [phone]  Primary Care Physician:  Lauro RegulusAnderson, Marshall W, MD Primary Gastroenterologist:  Dr. Wyline [name] Karo Rog   Pre-Procedure History & Physical: HPI:  Fransico Setterslbert C [name] is a 71 y.o. male is here for an endoscopy and colonoscopy.   Past Medical History:  Diagnosis Date  . Anemia   . Atrial fibrillation and flutter (HCC)   . GERD (gastroesophageal reflux disease)   . HTN (hypertension)   . Internal hemorrhoids with complication   . Sinoatrial node dysfunction (HCC)     Past Surgical History:  Procedure Laterality Date  . CARDIOVERSION N/A 07/11/2014   Procedure: CARDIOVERSION;  Surgeon: Pricilla RifflePaula Ross V, MD;  [facility];  Service: Cardiovascular;  Laterality: N/A;  . HEMORROIDECTOMY    . PACEMAKER PLACEMENT    . TEE WITHOUT CARDIOVERSION N/A 07/11/2014   Procedure: TRANSESOPHAGEAL ECHOCARDIOGRAM (TEE);  Surgeon: Pricilla RifflePaula Ross V, MD;  [facility];  Service: Cardiovascular;  Laterality: N/A;    Prior to Admission medications   Medication Sig Start Date End Date Taking? Authorizing Provider  amiodarone (PACERONE) 200 MG tablet Take 0.5 tablets (100 mg total) by mouth 2 (two) times daily. Begin after you stop the 400 mg pill Patient taking differently: Take 100 mg by mouth daily.  07/15/14  Yes Elenora GammaBradshaw, Samuel L, MD  apixaban (ELIQUIS) 5 MG TABS tablet Take 1 tablet (5 mg total) by mouth 2 (two) times daily. 07/15/14  Yes Elenora GammaBradshaw, Samuel L, MD  ferrous sulfate 325 (65 FE) MG tablet Take 1 tablet (325 mg total) by mouth daily with breakfast. 07/15/14  Yes Elenora GammaBradshaw, Samuel L, MD  KLOR-CON M10 10 MEQ tablet Take 10 mEq by mouth daily.  07/02/14  Yes [provider]  levothyroxine (SYNTHROID, LEVOTHROID) 25 MCG tablet Take 25 mcg by mouth daily. 02/14/17  Yes [provider]  metoprolol (LOPRESSOR) 50 MG tablet Take 1 tablet (50 mg total) by mouth 2 (two) times  daily. 07/15/14  Yes Elenora GammaBradshaw, Samuel L, MD  torsemide (DEMADEX) 5 MG tablet Take 1 tablet (5 mg total) by mouth daily. Patient taking differently: Take 10 mg by mouth daily.  07/15/14  Yes Elenora GammaBradshaw, Samuel L, MD  vitamin B-12 (CYANOCOBALAMIN) 1000 MCG tablet Take 1,000 mcg by mouth daily.   Yes [provider]    Allergies as of 02/27/2017 - Review Complete 02/27/2017  Allergen Reaction Noted  . Penicillins Rash 07/08/2014    Family History  Problem Relation Age of Onset  . Leukemia Mother     Social History   Social History  . Marital status: Married    Spouse name: N/A  . Number of children: N/A  . Years of education: N/A   Occupational History  . Not on file.   Social History Main Topics  . Smoking status: Former Smoker    Types: Cigarettes    Quit date: 11/05/1965  . Smokeless tobacco: Never Used  . Alcohol use No  . Drug use: Unknown  . Sexual activity: Not on file   Other Topics Concern  . Not on file   Social History Narrative  . No narrative on file    Review of Systems: See HPI, otherwise negative ROS  Physical Exam: BP (!) 115/57   Pulse 66   Temp 97.8 F (36.6 C) (Tympanic)   Resp 18   Ht 5\' 8"  (1.727 m)   Wt 198 lb 0.4 oz (89.8 kg)   SpO2 100%  BMI 30.11 kg/m  General:   Alert,  pleasant and cooperative in NAD Head:  Normocephalic and atraumatic. Neck:  Supple; no masses or thyromegaly. Lungs:  Clear throughout to auscultation.    Heart:  Regular rate and rhythm. Abdomen:  Soft, nontender and nondistended. Normal bowel sounds, without guarding, and without rebound.   Neurologic:  Alert and  oriented x4;  grossly normal neurologically.  Impression/Plan: Edward [name] is here for an endoscopy and colonoscopy to be performed for GI bleed   Risks, benefits, limitations, and alternatives regarding  endoscopy and colonoscopy have been reviewed with the patient.  Questions have been answered.  All parties agreeable.   Edward Mood, MD  03/02/2017, 11:07 AM

## 2017-03-02 NOTE — Op Note (Signed)
[facility] Patient Name: Edward [name] Procedure Date: 03/02/2017 11:38 AM MRN: [id number] Account #: [id number] Date of Birth: [birth year] Admit Type: Inpatient Age: 71 Room: [facility] ENDO ROOM 4 Gender: Male Note Status: Finalized Procedure:            Colonoscopy Indications:          Iron deficiency anemia Providers:            Wyline Mood MD, MD Referring MD:         No Local Md, MD (Referring MD) Medicines:            Monitored Anesthesia Care Complications:        No immediate complications. Procedure:            Pre-Anesthesia Assessment:                       - Prior to the procedure, a History and Physical was                        performed, and patient medications, allergies and                        sensitivities were reviewed. The patient's tolerance of                        previous anesthesia was reviewed.                       - The risks and benefits of the procedure and the                        sedation options and risks were discussed with the                        patient. All questions were answered and informed                        consent was obtained.                       - ASA Grade Assessment: III - A patient with severe                        systemic disease.                       - After reviewing the risks and benefits, the patient                        was deemed in satisfactory condition to undergo the                        procedure.                       After obtaining informed consent, the colonoscope was                        passed under direct vision. Throughout the procedure,                        the patient's  blood pressure, pulse, and oxygen                        saturations were monitored continuously. The                        Colonoscope was introduced through the anus and                        advanced to the the cecum, identified by the                        appendiceal orifice, IC  valve and transillumination.                        The colonoscopy was technically difficult and complex                        due to significant looping and a tortuous colon.                        Successful completion of the procedure was aided by                        applying abdominal pressure. The patient tolerated the                        procedure well. The quality of the bowel preparation                        was good. Findings:      A few small-mouthed diverticula were found in the entire colon.      Non-bleeding internal hemorrhoids were found during retroflexion. The       hemorrhoids were small and Grade I (internal hemorrhoids that do not       prolapse).      The exam was otherwise without abnormality. Impression:           - Diverticulosis in the entire examined colon.                       - Non-bleeding internal hemorrhoids.                       - The examination was otherwise normal.                       - No specimens collected. Recommendation:       - Return patient to hospital ward for ongoing care.                       - Resume previous diet.                       - Continue present medications.                       - 1. The colon was very tortious and although am pretty                        confident that I reached the cecum , the twisting of  the colon may at times mimic the appearance of the                        appendicular orifice. Hence would suggest outpatient                        barium enema                       2. Capsule study of the small bowel as an out patient                       3. Dose of IV iron                       4. Monitor CBC with PCP Procedure Code(s):    --- Professional ---                       [phone], Colonoscopy, flexible; diagnostic, including                        collection of specimen(s) by brushing or washing, when                        performed (separate procedure) Diagnosis Code(s):     --- Professional ---                       K64.0, First degree hemorrhoids                       D50.9, Iron deficiency anemia, unspecified                       K57.30, Diverticulosis of large intestine without                        perforation or abscess without bleeding CPT copyright 2016 American Medical Association. All rights reserved. The codes documented in this report are preliminary and upon coder review may  be revised to meet current compliance requirements. Wyline Mood, MD Wyline Mood MD, MD 03/02/2017 12:28:41 PM This report has been signed electronically. Number of Addenda: 0 Note Initiated On: 03/02/2017 11:38 AM Scope Withdrawal Time: 0 hours 5 minutes 39 seconds  Total Procedure Duration: 0 hours 32 minutes 31 seconds       [facility]

## 2017-03-02 NOTE — Care Management Important Message (Signed)
Important Message  Patient Details  Name: Fransico Setterslbert C Dearing MRN: [id number] Date of Birth: Jun 17, 1946   Medicare Important Message Given:  Yes    Gwenette GreetBrenda S Latrise Bowland, RN 03/02/2017, 8:14 AM

## 2017-03-02 NOTE — Care Management (Signed)
Physical therapy evaluation completed. Recommending outpatient therapy. Will get referral signed per Dr. Allena KatzPatel and fax to Rehabilitation Department. Mr. Edward [name] is in agreement with this plan Discharge to home per Dr. Venancio PoissonPatel Edward [name] s Edward OverlieHolland RN MSN CCM Care Management [phone]

## 2017-03-02 NOTE — Transfer of Care (Signed)
Immediate Anesthesia Transfer of Care Note  Patient: Edward [name]  Procedure(s) Performed: Procedure(s): ESOPHAGOGASTRODUODENOSCOPY (EGD) (N/A) COLONOSCOPY WITH PROPOFOL (N/A)  Patient Location: PACU and Endoscopy Unit  Anesthesia Type:General  Level of Consciousness: sedated  Airway & Oxygen Therapy: Patient Spontanous Breathing and Patient connected to nasal cannula oxygen  Post-op Assessment: Report given to RN and Post -op Vital signs reviewed and stable  Post vital signs: Reviewed and stable  Last Vitals:  Vitals:   03/02/17 0813 03/02/17 1102  BP: (!) 100/45 (!) 115/57  Pulse: 68 66  Resp: 20 18  Temp: 36.4 C 36.6 C    Last Pain:  Vitals:   03/02/17 1102  TempSrc: Tympanic  PainSc: 0-No pain         Complications: No apparent anesthesia complications

## 2017-03-02 NOTE — Anesthesia Postprocedure Evaluation (Signed)
Anesthesia Post Note  Patient: Edward [name]  Procedure(s) Performed: Procedure(s) (LRB): ESOPHAGOGASTRODUODENOSCOPY (EGD) (N/A) COLONOSCOPY WITH PROPOFOL (N/A)  Patient location during evaluation: PACU Anesthesia Type: General Level of consciousness: awake and alert Pain management: pain level controlled Vital Signs Assessment: post-procedure vital signs reviewed and stable Respiratory status: spontaneous breathing, nonlabored ventilation, respiratory function stable and patient connected to nasal cannula oxygen Cardiovascular status: blood pressure returned to baseline and stable Postop Assessment: no signs of nausea or vomiting Anesthetic complications: no     Last Vitals:  Vitals:   03/02/17 1241 03/02/17 1251  BP: (!) 93/41 (!) 93/44  Pulse: 62 64  Resp: 17 (!) 0  Temp:      Last Pain:  Vitals:   03/02/17 1241  TempSrc:   PainSc: Asleep                 Yevette EdwardsJames G Adams

## 2017-03-02 NOTE — Progress Notes (Signed)
[facility] [place] [facility]   PATIENT NAME: Edward [name]    MR#:  [id number]  DATE OF BIRTH:  1946/05/26  SUBJECTIVE:  No complaints  REVIEW OF SYSTEMS:   Review of Systems  Constitutional: Negative for chills, fever and weight loss.  HENT: Negative for ear discharge, ear pain and nosebleeds.   Eyes: Negative for blurred vision, pain and discharge.  Respiratory: Negative for sputum production, shortness of breath, wheezing and stridor.   Cardiovascular: Negative for chest pain, palpitations, orthopnea and PND.  Gastrointestinal: Negative for abdominal pain, diarrhea, nausea and vomiting.  Genitourinary: Negative for frequency and urgency.  Musculoskeletal: Negative for back pain and joint pain.  Neurological: Positive for weakness. Negative for sensory change, speech change and focal weakness.  Psychiatric/Behavioral: Negative for depression and hallucinations. The patient is not nervous/anxious.    Tolerating Diet:npo Tolerating PT: ambulates by self  DRUG ALLERGIES:   Allergies  Allergen Reactions  . Penicillins Rash    Patient rechallenged with Zosyn in Nov 2015 and developed abdominal rash within 12 hours.  .Has patient had a PCN reaction causing immediate rash, facial/tongue/throat swelling, SOB or lightheadedness with hypotension: No Has patient had a PCN reaction causing severe rash involving mucus membranes or skin necrosis: No Has patient had a PCN reaction that required hospitalization: No Has patient had a PCN reaction occurring within the last 10 years: No If all of the above answers are "NO", then may     VITALS:  Blood pressure (!) 100/45, pulse 68, temperature 97.6 F (36.4 C), temperature source Oral, resp. rate 20, height 5\' 8"  (1.727 m), weight 89.8 kg (198 lb 0.4 oz), SpO2 98 %.  PHYSICAL EXAMINATION:   Physical Exam  GENERAL:  71 y.o.-year-old patient lying in the bed with no acute distress.  EYES: Pupils equal,  round, reactive to light and accommodation. No scleral icterus. Extraocular muscles intact.  HEENT: Head atraumatic, normocephalic. Oropharynx and nasopharynx clear. Pallor+ NECK:  Supple, no jugular venous distention. No thyroid enlargement, no tenderness.  LUNGS: Normal breath sounds bilaterally, no wheezing, rales, rhonchi. No use of accessory muscles of respiration.  CARDIOVASCULAR: S1, S2 normal. No murmurs, rubs, or gallops.  ABDOMEN: Soft, nontender, nondistended. Bowel sounds present. No organomegaly or mass.  EXTREMITIES: No cyanosis, clubbing or edema b/l.    NEUROLOGIC: Cranial nerves II through XII are intact. No focal Motor or sensory deficits b/l.   PSYCHIATRIC:  patient is alert and oriented x 3.  SKIN: No obvious rash, lesion, or ulcer.   LABORATORY PANEL:  CBC  Recent Labs Lab 03/01/17 0449  WBC 5.5  HGB 7.9*  HCT 25.4*  PLT 273    Chemistries   Recent Labs Lab 02/27/17 1524  03/01/17 0449  NA 136  < > 139  K 4.1  < > 3.4*  CL 106  < > 108  CO2 22  < > 25  GLUCOSE 101*  < > 97  BUN 26*  < > 15  CREATININE 2.42*  < > 2.06*  CALCIUM 8.7*  < > 8.6*  AST 39  --   --   ALT 9*  --   --   ALKPHOS 48  --   --   BILITOT 1.5*  --   --   < > = values in this interval not displayed. Cardiac Enzymes No results for input(s): TROPONINI in the last 168 hours. RADIOLOGY:  No results found. ASSESSMENT AND PLAN:   Edward [name] a 71  y.o. Sharlot Gowdamalewith a known history of Atrial fibrillation/flutter on amiodarone and oral anticoagulation, hypertension, internal hemorrhoids and diverticulosis with on and off rectal bleed for several months comes to the emergency room where hemoglobin was found to be low at 5.8. His baseline hemoglobin is 11.3 in February 2018.  1. GI bleed appears lower need to rule out upper -received IV fluids---now d/ced -Type and screen and transfused 2 unit of blood transfusion -Baseline hemoglobin 11.3 -Came in with hemoglobin of 5.8---2  units---8.2--7.9 -Hold oral anticoagulation - GI consult noted. EGD/colonoscopy today -needs to be off eliquis for 48 hours - npo at present  2. Atrial flutter fibrillation -Continue amiodarone,metoprolol (decreased dose to 12.5 mg bid)  3. GERD continue PPI  4. Hypertension - held metoprolol  5. DVT prophylaxis SCD and teds No antiplatelet agents secondary to GI bleed  6. CKD stage- 3 slight worse on admissison, improved with iv fluids.   Case discussed with Care Management/Social Worker. Management plans discussed with the patient, family and they are in agreement.  CODE STATUS: FULL  DVT Prophylaxis: SCD TOTAL TIME TAKING CARE OF THIS PATIENT: *25* minutes.  >50% time spent on counselling and coordination of care  POSSIBLE D/C IN 1 DAYS, DEPENDING ON CLINICAL CONDITION.  Note: This dictation was prepared with Dragon dictation along with smaller phrase technology. Any transcriptional errors that result from this process are unintentional.  Rukia Mcgillivray M.D on 03/02/2017 at 8:28 AM  Between 7am to 6pm - Pager - [phone]  After 6pm go to [email or web address] - Social research officer, governmentpassword [email or web address]  [facility]  Office  [phone]  CC: Primary care physician; Lauro RegulusAnderson, Marshall W, MD

## 2017-03-02 NOTE — Anesthesia Preprocedure Evaluation (Addendum)
Anesthesia Evaluation  Patient identified by MRN, date of birth, ID band Patient awake    Reviewed: Allergy & Precautions, H&P , NPO status , Patient's Chart, lab work & pertinent test results, reviewed documented beta blocker date and time   Airway Mallampati: III   Neck ROM: full    Dental  (+) Poor Dentition, Loose, Missing   Pulmonary neg pulmonary ROS, former smoker,    Pulmonary exam normal        Cardiovascular Exercise Tolerance: Poor hypertension, On Medications negative cardio ROS Normal cardiovascular exam Rhythm:Regular Rate:Normal     Neuro/Psych negative neurological ROS  negative psych ROS   GI/Hepatic negative GI ROS, Neg liver ROS, GERD  Medicated,  Endo/Other  negative endocrine ROSHypothyroidism Morbid obesity  Renal/GU negative Renal ROS  negative genitourinary   Musculoskeletal   Abdominal   Peds  Hematology negative hematology ROS (+) anemia ,   Anesthesia Other Findings Past Medical History: No date: Anemia No date: Atrial fibrillation and flutter (HCC) No date: GERD (gastroesophageal reflux disease) No date: HTN (hypertension) No date: Internal hemorrhoids with complication No date: Sinoatrial node dysfunction (HCC) Past Surgical History: 07/11/2014: CARDIOVERSION N/A     Comment: Procedure: CARDIOVERSION;  Surgeon: Pricilla RifflePaula Ross              V, MD;  Location: [facility] ENDOSCOPY;  Service:               Cardiovascular;  Laterality: N/A; No date: HEMORROIDECTOMY No date: PACEMAKER PLACEMENT 07/11/2014: TEE WITHOUT CARDIOVERSION N/A     Comment: Procedure: TRANSESOPHAGEAL ECHOCARDIOGRAM               (TEE);  Surgeon: Pricilla RifflePaula Ross V, MD;  [facility]               [facility];  Service: Cardiovascular;                Laterality: N/A; BMI    Body Mass Index:  30.11 kg/m     Reproductive/Obstetrics negative OB ROS                            Anesthesia Physical Anesthesia  Plan  ASA: III  Anesthesia Plan: General   Post-op Pain Management:    Induction:   PONV Risk Score and Plan: 2 and Ondansetron and Dexamethasone  Airway Management Planned:   Additional Equipment:   Intra-op Plan:   Post-operative Plan:   Informed Consent: I have reviewed the patients History and Physical, chart, labs and discussed the procedure including the risks, benefits and alternatives for the proposed anesthesia with the patient or authorized representative who has indicated his/her understanding and acceptance.   Dental Advisory Given  Plan Discussed with: CRNA  Anesthesia Plan Comments:         Anesthesia Quick Evaluation

## 2017-03-02 NOTE — Anesthesia Procedure Notes (Signed)
Date/Time: 03/02/2017 11:43 AM Performed by: Stormy FabianURTIS, Tiauna Whisnant Pre-anesthesia Checklist: Patient identified, Emergency Drugs available, Suction available and Patient being monitored Patient Re-evaluated:Patient Re-evaluated prior to inductionOxygen Delivery Method: Nasal cannula Intubation Type: IV induction Dental Injury: Teeth and Oropharynx as per pre-operative assessment  Comments: Nasal cannula with etCO2 monitoring

## 2017-03-02 NOTE — Discharge Summary (Signed)
[facility] [place] [facility]   PATIENT NAME: Edward [name]    MR#:  [id number]  DATE OF BIRTH:  Apr 27, 1946  DATE OF ADMISSION:  02/27/2017 ADMITTING PHYSICIAN: Enedina FinnerSona Kyoko Elsea, MD  DATE OF DISCHARGE: 03/02/2017  PRIMARY CARE PHYSICIAN: Lauro RegulusAnderson, Marshall W, MD    ADMISSION DIAGNOSIS:  Lower GI bleed [K92.2]  DISCHARGE DIAGNOSIS:  GI bleed secondary to mild gastritis, internal hemorrhoids Chronic A. fib on oral anticoagulation Acute on chronic anemia SECONDARY DIAGNOSIS:   Past Medical History:  Diagnosis Date  . Anemia   . Atrial fibrillation and flutter (HCC)   . GERD (gastroesophageal reflux disease)   . HTN (hypertension)   . Internal hemorrhoids with complication   . Sinoatrial node dysfunction [facility](HCC)     HOSPITAL COURSE:  Edward Essexlbert Thompsonis a 71 y.o. malewith a known history of Atrial fibrillation/flutter on amiodarone and oral anticoagulation, hypertension, internal hemorrhoids and diverticulosis with on and off rectal bleed for several months comes to the emergency room where hemoglobin was found to be low at 5.8. His baseline hemoglobin is 11.3 in February 2018.  1. GI bleed appears lower need to rule out upper -received IV fluids---now d/ced -Type and screen and transfused 2unit of blood transfusion -Baseline hemoglobin 11.3 -Came in with hemoglobin of 5.8---2 units---8.2--7.9 -Held oral anticoagulation----eliquis will now be resumed at discharge - GI consult noted. EGD showed Erythematous mucosa in the gastric fundus -colonoscopy showed diverticulosis and R:, Nonbleeding internal hemorrhoids.  -Patient will follow up with primary care physician Dr. Dareen PianoAnderson to make sure hemoglobin stays stable. He is back on oral anticoagulation -Oral iron 3 times a day -We'll give 1 dose of IV iron prior to discharge today -Patient will follow up with GI Dr. Tobi BastosAnna for capsule endoscopy study as outpatient  2. Atrial flutter  fibrillation -Continue amiodarone,metoprolol (decreased dose to 12.5 mg bid) -eliquis now resumed at discharge  3. GERD continue PPI  4. Hypertension - on metoprolol  5. DVT prophylaxis SCD and teds No antiplatelet agents secondary to GI bleed  6. CKD stage- 3 slight worse on admissison, improved with iv fluids.  Patient overall doing well. We'll discharge today after IV iron dose was received. This was discussed with patient who is agreeable.  CONSULTS OBTAINED:  Treatment Team:  Wyline MoodAnna, Kiran, MD  DRUG ALLERGIES:   Allergies  Allergen Reactions  . Penicillins Rash    Patient rechallenged with Zosyn in Nov 2015 and developed abdominal rash within 12 hours.  .Has patient had a PCN reaction causing immediate rash, facial/tongue/throat swelling, SOB or lightheadedness with hypotension: No Has patient had a PCN reaction causing severe rash involving mucus membranes or skin necrosis: No Has patient had a PCN reaction that required hospitalization: No Has patient had a PCN reaction occurring within the last 10 years: No If all of the above answers are "NO", then may     DISCHARGE MEDICATIONS:   Current Discharge Medication List    START taking these medications   Details  pantoprazole (PROTONIX) 40 MG tablet Take 1 tablet (40 mg total) by mouth daily. Qty: 30 tablet, Refills: 1      CONTINUE these medications which have CHANGED   Details  ferrous sulfate 325 (65 FE) MG tablet Take 1 tablet (325 mg total) by mouth 2 (two) times daily with a meal. Qty: 60 tablet, Refills: 3    metoprolol tartrate (LOPRESSOR) 25 MG tablet Take 0.5 tablets (12.5 mg total) by mouth 2 (two) times daily. Qty: 30  tablet, Refills: 0      CONTINUE these medications which have NOT CHANGED   Details  amiodarone (PACERONE) 200 MG tablet Take 0.5 tablets (100 mg total) by mouth 2 (two) times daily. Begin after you stop the 400 mg pill Qty: 30 tablet, Refills: 3    apixaban (ELIQUIS) 5 MG TABS  tablet Take 1 tablet (5 mg total) by mouth 2 (two) times daily. Qty: 60 tablet, Refills: 3    KLOR-CON M10 10 MEQ tablet Take 10 mEq by mouth daily.     levothyroxine (SYNTHROID, LEVOTHROID) 25 MCG tablet Take 25 mcg by mouth daily.    torsemide (DEMADEX) 5 MG tablet Take 1 tablet (5 mg total) by mouth daily. Qty: 30 tablet, Refills: 2    vitamin B-12 (CYANOCOBALAMIN) 1000 MCG tablet Take 1,000 mcg by mouth daily.        If you experience worsening of your admission symptoms, develop shortness of breath, life threatening emergency, suicidal or homicidal thoughts you must seek medical attention immediately by calling 911 or calling your MD immediately  if symptoms less severe.  You Must read complete instructions/literature along with all the possible adverse reactions/side effects for all the Medicines you take and that have been prescribed to you. Take any new Medicines after you have completely understood and accept all the possible adverse reactions/side effects.   Please note  You were cared for by a hospitalist during your hospital stay. If you have any questions about your discharge medications or the care you received while you were in the hospital after you are discharged, you can call the unit and asked to speak with the hospitalist on call if the hospitalist that took care of you is not available. Once you are discharged, your primary care physician will handle any further medical issues. Please note that NO REFILLS for any discharge medications will be authorized once you are discharged, as it is imperative that you return to your primary care physician (or establish a relationship with a primary care physician if you do not have one) for your aftercare needs so that they can reassess your need for medications and monitor your lab values. Today   SUBJECTIVE   Doing well denies any complaints  VITAL SIGNS:  Blood pressure (!) 95/43, pulse (!) 59, temperature (!) 96 F (35.6  C), resp. rate 10, height 5\' 8"  (1.727 m), weight 89.8 kg (198 lb 0.4 oz), SpO2 99 %.  I/O:    Intake/Output Summary (Last 24 hours) at 03/02/17 1522 Last data filed at 03/02/17 0545  Gross per 24 hour  Intake             5.67 ml  Output              575 ml  Net          -569.33 ml    PHYSICAL EXAMINATION:  GENERAL:  71 y.o.-year-old patient lying in the bed with no acute distress.  EYES: Pupils equal, round, reactive to light and accommodation. No scleral icterus. Extraocular muscles intact.  HEENT: Head atraumatic, normocephalic. Oropharynx and nasopharynx clear.  NECK:  Supple, no jugular venous distention. No thyroid enlargement, no tenderness.  LUNGS: Normal breath sounds bilaterally, no wheezing, rales,rhonchi or crepitation. No use of accessory muscles of respiration.  CARDIOVASCULAR: S1, S2 normal. No murmurs, rubs, or gallops.  ABDOMEN: Soft, non-tender, non-distended. Bowel sounds present. No organomegaly or mass.  EXTREMITIES: No pedal edema, cyanosis, or clubbing.  NEUROLOGIC: Cranial nerves II through  XII are intact. Muscle strength 5/5 in all extremities. Sensation intact. Gait not checked.  PSYCHIATRIC: The patient is alert and oriented x 3.  SKIN: No obvious rash, lesion, or ulcer.   DATA REVIEW:   CBC   Recent Labs Lab 03/01/17 0449  WBC 5.5  HGB 7.9*  HCT 25.4*  PLT 273    Chemistries   Recent Labs Lab 02/27/17 1524  03/01/17 0449  NA 136  < > 139  K 4.1  < > 3.4*  CL 106  < > 108  CO2 22  < > 25  GLUCOSE 101*  < > 97  BUN 26*  < > 15  CREATININE 2.42*  < > 2.06*  CALCIUM 8.7*  < > 8.6*  AST 39  --   --   ALT 9*  --   --   ALKPHOS 48  --   --   BILITOT 1.5*  --   --   < > = values in this interval not displayed.  Microbiology Results   No results found for this or any previous visit (from the past 240 hour(s)).  RADIOLOGY:  No results found.   Management plans discussed with the patient, family and they are in agreement.  CODE  STATUS:     Code Status Orders        Start     Ordered   02/27/17 1857  Full code  Continuous     02/27/17 1856    Code Status History    Date Active Date Inactive Code Status Order ID Comments User Context   07/08/2014  7:53 PM 07/15/2014  5:45 PM Full Code [id number]  Abram Sander, MD Inpatient    Advance Directive Documentation     Most Recent Value  Type of Advance Directive  Healthcare Power of Attorney, Living will  Pre-existing out of facility DNR order (yellow form or pink MOST form)  -  "MOST" Form in Place?  -      TOTAL TIME TAKING CARE OF THIS PATIENT: *40* minutes.    Emelin Dascenzo M.D on 03/02/2017 at 3:22 PM  Between 7am to 6pm - Pager - [phone] After 6pm go to [email or web address] - Social research officer, government  [facility]  Office  [phone]  CC: Primary care physician; Lauro Regulus, MD

## 2017-03-02 NOTE — Op Note (Signed)
[facility] Gastroenterology Patient Name: Edward [name] Procedure Date: 03/02/2017 11:38 AM MRN: [id number] Account #: [id number] Date of Birth: 1945/09/18 Admit Type: Inpatient Age: 71 Room: [facility] ENDO ROOM 4 Gender: Male Note Status: Finalized Procedure:            Upper GI endoscopy Indications:          Iron deficiency anemia Providers:            Wyline MoodKiran Maximino Cozzolino MD, MD Referring MD:         No Local Md, MD (Referring MD) Medicines:            Monitored Anesthesia Care Complications:        No immediate complications. Procedure:            Pre-Anesthesia Assessment:                       - Prior to the procedure, a History and Physical was                        performed, and patient medications, allergies and                        sensitivities were reviewed. The patient's tolerance of                        previous anesthesia was reviewed.                       - The risks and benefits of the procedure and the                        sedation options and risks were discussed with the                        patient. All questions were answered and informed                        consent was obtained.                       - ASA Grade Assessment: III - A patient with severe                        systemic disease.                       After obtaining informed consent, the endoscope was                        passed under direct vision. Throughout the procedure,                        the patient's blood pressure, pulse, and oxygen                        saturations were monitored continuously. The Endoscope                        was introduced through the mouth, and advanced to the  third part of duodenum. The upper GI endoscopy was                        accomplished without difficulty. The patient tolerated                        the procedure well. Findings:      The examined duodenum was normal. Biopsies for histology were taken  with       a cold forceps for evaluation of celiac disease.      Localized moderately erythematous mucosa without bleeding was found in       the gastric fundus. Biopsies were taken with a cold forceps for       histology.      The esophagus was normal. Impression:           - Normal examined duodenum. Biopsied.                       - Erythematous mucosa in the gastric fundus. Biopsied.                       - Normal esophagus. Recommendation:       - Await pathology results.                       - Perform a colonoscopy today. Procedure Code(s):    --- Professional ---                       [id number], Esophagogastroduodenoscopy, flexible, transoral;                        with biopsy, single or multiple Diagnosis Code(s):    --- Professional ---                       K31.89, Other diseases of stomach and duodenum                       D50.9, Iron deficiency anemia, unspecified CPT copyright 2016 American Medical Association. All rights reserved. The codes documented in this report are preliminary and upon coder review may  be revised to meet current compliance requirements. Wyline Mood, MD Wyline Mood MD, MD 03/02/2017 11:48:48 AM This report has been signed electronically. Number of Addenda: 0 Note Initiated On: 03/02/2017 11:38 AM      [facility]

## 2017-03-02 NOTE — Progress Notes (Signed)
Pt wheeled out per nurse to car at this time for home. Alert. No resp distress. Iron finished earlier and pt tol well.  Sl x2 d/cd/   Discharge instructions discussed with pt and pts friends who came to  Take  Him  Home. tol colon and egd well today.  Vss.  Diet / activity /  F/u and meds discussed with pt. Verbalized understanding.no voiced c/o pain.

## 2017-03-02 NOTE — Anesthesia Post-op Follow-up Note (Cosign Needed)
Anesthesia QCDR form completed.        

## 2017-03-02 NOTE — Discharge Instructions (Signed)
Gastrointestinal Bleeding °Gastrointestinal bleeding is bleeding somewhere along the path food travels through the body (digestive tract). This path is anywhere between the mouth and the opening of the butt (anus). You may have blood in your poop (stools) or have black poop. If you throw up (vomit), there may be blood in it. °This condition can be mild, serious, or even life-threatening. If you have a lot of bleeding, you may need to stay in the hospital. °Follow these instructions at home: °· Take over-the-counter and prescription medicines only as told by your doctor. °· Eat foods that have a lot of fiber in them. These foods include whole grains, fruits, and vegetables. You can also try eating 1-3 prunes each day. °· Drink enough fluid to keep your pee (urine) clear or pale yellow. °· Keep all follow-up visits as told by your doctor. This is important. °Contact a doctor if: °· Your symptoms do not get better. °Get help right away if: °· Your bleeding gets worse. °· You feel dizzy or you pass out (faint). °· You feel weak. °· You have very bad cramps in your back or belly (abdomen). °· You pass large clumps of blood (clots) in your poop. °· Your symptoms are getting worse. °This information is not intended to replace advice given to you by your health care provider. Make sure you discuss any questions you have with your health care provider. °Document Released: 05/31/2008 Document Revised: 01/28/2016 Document Reviewed: 02/09/2015 °Elsevier Interactive Patient Education © 2018 Elsevier Inc. ° °

## 2017-03-03 ENCOUNTER — Encounter: Payer: Self-pay | Admitting: Gastroenterology

## 2017-03-06 LAB — SURGICAL PATHOLOGY

## 2017-03-13 ENCOUNTER — Encounter: Payer: Self-pay | Admitting: Gastroenterology

## 2017-03-14 ENCOUNTER — Encounter: Payer: Self-pay | Admitting: Physical Therapy

## 2017-03-14 ENCOUNTER — Ambulatory Visit: Payer: Medicare HMO | Attending: Internal Medicine | Admitting: Physical Therapy

## 2017-03-14 DIAGNOSIS — R2681 Unsteadiness on feet: Secondary | ICD-10-CM | POA: Insufficient documentation

## 2017-03-14 NOTE — Patient Instructions (Signed)
SIT TO STAND: No Device   Sit with feet shoulder-width apart, on floor.(Make sure that you are in a chair that won't move like a chair against a wall or couch etc) Lean chest forward, raise hips up from surface. Straighten hips and knees. Weight bear equally on left and right sides. 10___ reps per set, _2__ sets per day, _5__ days per week Place left leg closer to sitting surface.  Copyright  VHI. All rights reserved.  Backward Walking   Walk backward, toes of each foot coming down first. Take long, even strides. Make sure you have a clear pathway with no obstructions when you do this. Stand beside counter and walk backward  And then walk forward doing opposite directions; repeat 10 laps 2x a day at least 5 days a week.  Copyright  VHI. All rights reserved.  Tandem Walking   Stand beside kitchen sink and place one foot in front of the other, lift your hand and try to hold position for 10 sec. Repeat with other foot in front; Repeat 5 reps with each foot in front 5 days a week.Balance: Unilateral   Copyright  VHI. All rights reserved.

## 2017-03-14 NOTE — Therapy (Addendum)
[facility] MAIN [facility] SERVICES [address] [place], [place], [address] Phone: [phone]   Fax:  [phone]  Physical Therapy Evaluation  Patient Details  Name: Edward [name] MRN: [id number] Date of Birth: April 19, 1946 Referring Provider: Dr Einar Crow  Encounter Date: 03/14/2017      PT End of Session - 03/14/17 1725    Visit Number 1   Number of Visits 7   Date for PT Re-Evaluation 04/25/17   Authorization Type g code 1   Authorization Time Period 10   PT Start Time 1545   PT Stop Time 1635   PT Time Calculation (min) 50 min   Equipment Utilized During Treatment Gait belt   Activity Tolerance Patient tolerated treatment well   Behavior During Therapy [facility] for tasks assessed/performed;Flat affect      Past Medical History:  Diagnosis Date  . Anemia   . Atrial fibrillation and flutter (HCC)   . GERD (gastroesophageal reflux disease)   . HTN (hypertension)    controlled with medication  . Internal hemorrhoids with complication   . Sinoatrial node dysfunction (HCC)     Past Surgical History:  Procedure Laterality Date  . CARDIOVERSION N/A 07/11/2014   Procedure: CARDIOVERSION;  Surgeon: Pricilla Riffle, MD;  Location: [facility] ENDOSCOPY;  Service: Cardiovascular;  Laterality: N/A;  . COLONOSCOPY WITH PROPOFOL N/A 03/02/2017   Procedure: COLONOSCOPY WITH PROPOFOL;  Surgeon: Wyline Mood, MD;  Location: [facility] ENDOSCOPY;  Service: Endoscopy;  Laterality: N/A;  . ESOPHAGOGASTRODUODENOSCOPY N/A 03/02/2017   Procedure: ESOPHAGOGASTRODUODENOSCOPY (EGD);  Surgeon: Wyline Mood, MD;  Location: [facility] ENDOSCOPY;  Service: Endoscopy;  Laterality: N/A;  . HEMORROIDECTOMY    . PACEMAKER PLACEMENT    . TEE WITHOUT CARDIOVERSION N/A 07/11/2014   Procedure: TRANSESOPHAGEAL ECHOCARDIOGRAM (TEE);  Surgeon: Pricilla Riffle, MD;  Location: [facility] ENDOSCOPY;  Service: Cardiovascular;  Laterality: N/A;    There were no vitals filed for this visit.       Subjective Assessment - 03/14/17 1546    Subjective Pt reports he was in hospital and while there referred to PT; pt reports no falls recently; the transfusion last week has resolved symptoms of dizziness and leg cramping;   Pertinent History Pt is a 71 y/o male, pt comes to therapy ambulating with no AD; pt was admitted to hospital 03/06/17 and discharged 03/09/17; Pt is anemic and had problems with hemorrhoids bursting and decreased hemoglobin; Pt drove himself to ED where he received a blood transfusion; Prior to the transfusion pt was feeling weak in the legs and dizziness but that has been resolved; Pt was seen by acute care PT in hospital and when walking with the therapist, acute care PT decided to refer him for balance and weakness;    How long can you sit comfortably? 3+ hours   How long can you stand comfortably? 1-1.5 hours   How long can you walk comfortably? 1000 ft   Diagnostic tests had EGD which showed no active bleeding   Patient Stated Goals to get better   Currently in Pain? No/denies            [facility] PT Assessment - 03/14/17 1543      Assessment   Medical Diagnosis weakness and balance   Referring Provider Dr Einar Crow   Onset Date/Surgical Date 03/06/17   Hand Dominance Right   Next MD Visit pt missed appointment yesterday because he forgot; has not rescheduled yet   Prior Therapy Previous hospitalization had therapy where  they practiced stairs; denies any outpatient PT for this condition;      Precautions   Precautions None     Restrictions   Weight Bearing Restrictions No     Balance Screen   Has the patient fallen in the past 6 months No   Has the patient had a decrease in activity level because of a fear of falling?  No   Is the patient reluctant to leave their home because of a fear of falling?  No     Home Environment   Living Environment Private residence   Living Arrangements Alone   Available Help at Discharge Friend(s)  "help is a phone  call away, no problem"   Type of Home Apartment   Home Access Level entry   Home Layout One level   Home Equipment Walker - 4 wheels;Wheelchair - manual;Grab bars - tub/shower  only uses grab bars at home     Prior Function   Level of Independence Independent   Vocation Retired   Leisure none stated     Cognition   Overall Cognitive Status Within Functional Limits for tasks assessed  Pt had delayed responses   Behaviors Other (comment)  flat affect, delayed response to questions     Observation/Other Assessments   Activities of Balance Confidence Scale (ABC Scale)  82% (not limited with activities)   Dizziness Handicap Inventory (DHI)  1 (low perception of handicap)     Sensation   Light Touch Appears Intact     Coordination   Gross Motor Movements are Fluid and Coordinated Yes     Posture/Postural Control   Posture/Postural Control No significant limitations     AROM   Overall AROM Comments grossly WFL     Strength   Overall Strength Comments grossly 5/5 all LE     Transfers   Transfers Independent with all Transfers     Ambulation/Gait   Ambulation/Gait Yes   Ambulation/Gait Assistance 7: Independent   Gait Comments pt showed no abnormalities or difficulty with walking     Standardized Balance Assessment   Five times sit to stand comments  21 sec (> 14.2 sec more likely to have balance dysfunction)   10 Meter Walk 1.2 m/s (average of 2 tests; community ambulator)     Functional Gait  Assessment   Gait assessed  Yes   Gait Level Surface Walks 20 ft in less than 5.5 sec, no assistive devices, good speed, no evidence for imbalance, normal gait pattern, deviates no more than 6 in outside of the 12 in walkway width.   Change in Gait Speed Able to smoothly change walking speed without loss of balance or gait deviation. Deviate no more than 6 in outside of the 12 in walkway width.   Gait with Horizontal Head Turns Performs head turns smoothly with slight change in gait  velocity (eg, minor disruption to smooth gait path), deviates 6-10 in outside 12 in walkway width, or uses an assistive device.   Gait with Vertical Head Turns Performs head turns with no change in gait. Deviates no more than 6 in outside 12 in walkway width.   Gait and Pivot Turn Pivot turns safely within 3 sec and stops quickly with no loss of balance.   Step Over Obstacle Is able to step over 2 stacked shoe boxes taped together (9 in total height) without changing gait speed. No evidence of imbalance.   Gait with Narrow Base of Support Ambulates less than 4 steps heel to  toe or cannot perform without assistance.   Gait with Eyes Closed Walks 20 ft, uses assistive device, slower speed, mild gait deviations, deviates 6-10 in outside 12 in walkway width. Ambulates 20 ft in less than 9 sec but greater than 7 sec.   Ambulating Backwards Walks 20 ft, uses assistive device, slower speed, mild gait deviations, deviates 6-10 in outside 12 in walkway width.   Steps Alternating feet, no rail.   Total Score 24   FGA comment: <23 increase risk of fall;         TREATMENT: Instructed patient in HEP including: Sit<>Stand unsupported with cues to use a stable chair for safety; Backwards walking with cues to use the counter for safety Tandem walking with cues to do by counter for safety; Please see patient instructions for additional details regarding HEP recommendations;  Patient verbalized understanding;                      PT Education - 03/14/17 1648    Education provided Yes   Education Details HEP; fall risk; safety with gait   Person(s) Educated Patient   Methods Explanation;Handout;Demonstration   Comprehension Verbalized understanding;Returned demonstration             PT Long Term Goals - 03/14/17 1712      PT LONG TERM GOAL #1   Title Pt will decrease time in the 5 times sit to stand to < 14.2 sec in order to decrease fall risk   Baseline 21 sec   Time 6    Period Weeks   Status New     PT LONG TERM GOAL #2   Title Pt will tandem walk 20 feet with no loss of balance in order to improve balance with narrow support and improve gait safety;   Baseline unable to take 3 steps in tandem walk   Time 6   Period Weeks   Status New     PT LONG TERM GOAL #3   Title Pt will become independent in HEP to improve carryover from PT session interventions and improve higher level balance.   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; Pt sit to stand was >15 sec and puts him at risk for falls; Pt did show some difficulty with activities in the FGA including walking backwards, tandem walking, and walking eyes closed; pt would benefit from skilled PT intervention in order to improve higher level balance and gait safety;   History and Personal Factors relevant to plan of care: age; lives alone; cardiovascular health; anemia;    Clinical Presentation Stable   Clinical Presentation due to: pt exhibits stable condition with symptoms mostly resolved; exhibits difficulty with higher level balance   Clinical Decision Making Low   Rehab Potential Good   Clinical Impairments Affecting Rehab Potential good: low fall history; pt is motivated; wants to keep independence; poor: age; lives alone; comorbidities   PT Frequency 1x / week   PT Duration 6 weeks   PT Treatment/Interventions Energy conservation;Gait training;Therapeutic activities;Therapeutic exercise;Balance training;Neuromuscular  re-education   PT Next Visit Plan higher level balance activities   PT Home Exercise Plan see pt instructions   Consulted and Agree with Plan of Care  Patient      Patient will benefit from skilled therapeutic intervention in order to improve the following deficits and impairments:  Decreased activity tolerance, Decreased balance, Difficulty walking  Visit Diagnosis: Unsteadiness on feet - Plan: PT plan of care cert/re-cert       G-Codes - 03-26-2017 0847    Functional Assessment Tool Used (Outpatient Only) ABC, 5 times sit<>Stand, 10 meter walk, functional gait assessment, clinical judgement (pt shows ability to make additional progress but will still have >1% impairement)   Functional Limitation Mobility: Walking and moving around   Mobility: Walking and Moving Around Current Status (N8295) At least 1 percent but less than 20 percent impaired, limited or restricted   Mobility: Walking and Moving Around Goal Status (A2130) At least 1 percent but less than 20 percent impaired, limited or restricted      Problem List Patient Active Problem List   Diagnosis Date Noted  . GI bleed 02/27/2017  . Chronic diastolic heart failure (HCC) 07/14/2014  . Atrial fibrillation with RVR (HCC)   . Chronic anticoagulation   . SIRS (systemic inflammatory response syndrome) (HCC)   . Cellulitis of leg, left 07/09/2014  . Altered mental status   . Blood poisoning   . Sepsis (HCC) 07/08/2014  . Atrial fibrillation and flutter (HCC) 07/08/2014  . Left ventricular dysfunction 07/08/2014  . Hypothyroidism 07/08/2014  . Sick sinus syndrome (HCC) 07/08/2014  . Cardiac pacemaker 07/08/2014  . 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 Benign prostatic hyperplasia with urinary obstruction 05/02/2012   Corrinne Eagle SPT This entire session was performed under direct supervision and direction of a licensed Estate agent . I have personally read, edited and approve of the note as written.  Trotter,Margaret PT, DPT 03/26/2017, 8:50 AM  [place] [facility] MAIN [facility] SERVICES [address] [place], [place],  [address] Phone: [phone]   Fax:  [phone]  Name: KYLAN LIBERATI MRN: [id number] Date of Birth: [birth year]

## 2017-03-16 ENCOUNTER — Ambulatory Visit: Payer: Medicare HMO | Admitting: Physical Therapy

## 2017-03-20 ENCOUNTER — Ambulatory Visit: Payer: Medicare HMO

## 2017-03-20 ENCOUNTER — Encounter: Payer: Self-pay | Admitting: Physical Therapy

## 2017-03-20 DIAGNOSIS — R2681 Unsteadiness on feet: Secondary | ICD-10-CM | POA: Diagnosis not present

## 2017-03-20 NOTE — Therapy (Signed)
[facility] MAIN [facility] SERVICES [address] [place], [place], [address] Phone: [phone]   Fax:  [phone]  Physical Therapy Treatment  Patient Details  Name: Edward [name] MRN: [id number] Date of Birth: Aug 31, 1946 Referring Provider: Dr Einar Crow  Encounter Date: 03/20/2017      PT End of Session - 03/20/17 1143    Visit Number 2   Number of Visits 7   Date for PT Re-Evaluation 04/25/17   Authorization Type g code 2   Authorization Time Period 10   PT Start Time 1047   PT Stop Time 1135   PT Time Calculation (min) 48 min   Equipment Utilized During Treatment Gait belt   Activity Tolerance Patient tolerated treatment well   Behavior During Therapy [facility] for tasks assessed/performed;Flat affect      Past Medical History:  Diagnosis Date  . Anemia   . Atrial fibrillation and flutter (HCC)   . GERD (gastroesophageal reflux disease)   . HTN (hypertension)    controlled with medication  . Internal hemorrhoids with complication   . Sinoatrial node dysfunction (HCC)     Past Surgical History:  Procedure Laterality Date  . CARDIOVERSION N/A 07/11/2014   Procedure: CARDIOVERSION;  Surgeon: Pricilla Riffle, MD;  Location: [facility] ENDOSCOPY;  Service: Cardiovascular;  Laterality: N/A;  . COLONOSCOPY WITH PROPOFOL N/A 03/02/2017   Procedure: COLONOSCOPY WITH PROPOFOL;  Surgeon: Wyline Mood, MD;  Location: [facility] ENDOSCOPY;  Service: Endoscopy;  Laterality: N/A;  . ESOPHAGOGASTRODUODENOSCOPY N/A 03/02/2017   Procedure: ESOPHAGOGASTRODUODENOSCOPY (EGD);  Surgeon: Wyline Mood, MD;  Location: [facility] ENDOSCOPY;  Service: Endoscopy;  Laterality: N/A;  . HEMORROIDECTOMY    . PACEMAKER PLACEMENT    . TEE WITHOUT CARDIOVERSION N/A 07/11/2014   Procedure: TRANSESOPHAGEAL ECHOCARDIOGRAM (TEE);  Surgeon: Pricilla Riffle, MD;  Location: [facility] ENDOSCOPY;  Service: Cardiovascular;  Laterality: N/A;    There were no vitals filed for this visit.       Subjective Assessment - 03/20/17 1102    Subjective Patient reports he has not been compliant with HEP. Had a busy weekend   Pertinent History Pt is a 71 y/o male, pt comes to therapy ambulating with no AD; pt was admitted to hospital 03/06/17 and discharged 03/09/17; Pt is anemic and had problems with hemorrhoids bursting and decreased hemoglobin; Pt drove himself to ED where he received a blood transfusion; Prior to the transfusion pt was feeling weak in the legs and dizziness but that has been resolved; Pt was seen by acute care PT in hospital and when walking with the therapist, acute care PT decided to refer him for balance and weakness;    How long can you sit comfortably? 3+ hours   How long can you stand comfortably? 1-1.5 hours   How long can you walk comfortably? 1000 ft   Diagnostic tests had EGD which showed no active bleeding   Patient Stated Goals to get better   Currently in Pain? No/denies    Neuro Re-ed Tandem stance x 45 seconds each side Ambulating backwards in // bars 4x Eyes closed feet together 45 seconds Airex pad reaching down to pick up cones and standing back up 6x 2 cones Single limb stance, tried on Airex, unable to perform, did with <3 seconds on stable surface, multiple trials  Stepping forward, sideways, backwards 3x each position 2x through. Gait with head turns in hallway reading cards  TherEx Sit to stands10x no UE assistance Forward lunges 5x each leg, require  occasional UE assistance x 2, cues for body mechanics and sequencing. Pt. Tend to overly flex trunk initially                     PT Education - 03/20/17 1138    Education provided Yes   Education Details old HEP, new HEP   Person(s) Educated Patient   Methods Explanation;Demonstration;Handout;Tactile cues;Verbal cues   Comprehension Verbalized understanding;Returned demonstration;Verbal cues required;Tactile cues required             PT Long Term Goals - 03/14/17 1712       PT LONG TERM GOAL #1   Title Pt will decrease time in the 5 times sit to stand to < 14.2 sec in order to decrease fall risk   Baseline 21 sec   Time 6   Period Weeks   Status New     PT LONG TERM GOAL #2   Title Pt will tandem walk 20 feet with no loss of balance in order to improve balance with narrow support and improve gait safety;   Baseline unable to take 3 steps in tandem walk   Time 6   Period Weeks   Status New     PT LONG TERM GOAL #3   Title Pt will become independent in HEP to improve carryover from PT session interventions and improve higher level balance.   Time 6   Period Weeks   Status New               Plan - 03/20/17 1148    Clinical Impression Statement Patient demonstrates toe out ambulatory pattern with decreased step length. Old HEP was reviewed with pt. Having difficulty performing backwards walking. New HEP reviewed and pt. Demonstrated understanding of new interventions. Single limb stance challenging to patient bilaterally and pt. Required frequent verbal cueing for continuation of task. Patient will continue to benefit from skilled physical therapy services in order to improve higher level balance and ambulatory safety.     Rehab Potential Good   Clinical Impairments Affecting Rehab Potential good: low fall history; pt is motivated; wants to keep independence; poor: age; lives alone; comorbidities   PT Frequency 1x / week   PT Duration 6 weeks   PT Treatment/Interventions Energy conservation;Gait training;Therapeutic activities;Therapeutic exercise;Balance training;Neuromuscular re-education   PT Next Visit Plan higher level balance activities   PT Home Exercise Plan see pt instructions   Consulted and Agree with Plan of Care Patient      Patient will benefit from skilled therapeutic intervention in order to improve the following deficits and impairments:  Decreased activity tolerance, Decreased balance, Difficulty walking  Visit  Diagnosis: Unsteadiness on feet     Problem List Patient Active Problem List   Diagnosis Date Noted  . GI bleed 02/27/2017  . Chronic diastolic heart failure (HCC) 07/14/2014  . Atrial fibrillation with RVR (HCC)   . Chronic anticoagulation   . SIRS (systemic inflammatory response syndrome) (HCC)   . Cellulitis of leg, left 07/09/2014  . Altered mental status   . Blood poisoning   . Sepsis (HCC) 07/08/2014  . Atrial fibrillation and flutter (HCC) 07/08/2014  . Left ventricular dysfunction 07/08/2014  . Hypothyroidism 07/08/2014  . Sick sinus syndrome (HCC) 07/08/2014  . Cardiac pacemaker 07/08/2014  . 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 Benign prostatic hyperplasia with urinary obstruction 05/02/2012   Precious Bard, PT, DPT   Precious Bard 03/20/2017, 11:50 AM  [place] [facility] MAIN [facility] SERVICES  [address] [place], [place], [address] Phone: [phone]   Fax:  [phone]  Name: Edward [name] MRN: [id number] Date of Birth: [birth year]

## 2017-03-20 NOTE — Patient Instructions (Addendum)
   Balance: Neck Rotation, Eyes Closed - Bilateral (Varied Surfaces)    Stand, feet shoulder width, eyes OPEN. Balance and turn head and neck from side to side. Repeat _10___ times per set. Do __2__ sets per session. Do _5___ sessions per week. Repeat on compliant surface: foam.  Copyright  VHI. All rights reserved.  Balance: Eyes Closed - Bilateral (Varied Surfaces)    Stand, feet together close eyes. Maintain balance _20___ seconds. Repeat _5___ times per set. Do __2__ sets per session. Do _5___ sessions per week. Repeat on compliant surface: foam.  Copyright  VHI. All rights reserved.  Balance: Eyes Closed - Unilateral (Varied Surfaces)    Stand on left foot, close eyes. Maintain balance _20___ seconds. Repeat __5__ times per set. Do __2__ sets per session. Do _5___ sessions per week. Repeat on compliant surface: foam.  Copyright  VHI. All rights reserved.  Balance: Three-Way Leg Swing    Stand on left foot and right foot, hands on counter or compliant surface. Reach other foot forward __3__ times, sideways _3__ times, back __3__ times. Hold each position __5__ seconds. Relax. Repeat __3__ times per set. Do __3__ sets per session. Do ___2_ sessions per day.  http://orth.exer.us/86   Copyright  VHI. All rights reserved.  Anterior    Stand with equal weight on both feet. Lunge with right leg and left leg along A direction and return _5__ times each. __2_ sets _2__ times per day.  http://gglj.exer.us/4   Copyright  VHI. All rights reserved.  Balance: Eyes Open - Unilateral (Varied Surfaces)    Stand on left foot, eyes open. Maintain balance __20__ seconds. Repeat _5___ times per set. Do __2__ sets per session. Do __5__ sessions per week. Repeat on compliant surface: foam.  Copyright  VHI. All rights reserved.  Balance: Neck Rotation, Eyes Closed - Bilateral (Varied Surfaces)

## 2017-03-22 ENCOUNTER — Ambulatory Visit: Payer: Medicare HMO | Admitting: Physical Therapy

## 2017-03-27 ENCOUNTER — Ambulatory Visit: Payer: Medicare HMO | Admitting: Physical Therapy

## 2017-03-27 ENCOUNTER — Encounter: Payer: Self-pay | Admitting: Physical Therapy

## 2017-03-27 DIAGNOSIS — R2681 Unsteadiness on feet: Secondary | ICD-10-CM | POA: Diagnosis not present

## 2017-03-27 NOTE — Patient Instructions (Addendum)
Hip Abduction: Side-Lying (Single Leg)    Lie on side with knees bent, tubing around thighs just above knees. Raise top leg, keeping knee bent. Repeat _20_ times per set. Repeat on other side. Do _2_ sets per session. Do _5_ sessions per week.  http://tub.exer.us/43   Copyright  VHI. All rights reserved.

## 2017-03-27 NOTE — Therapy (Signed)
[facility] MAIN [facility] [address] [place], [place], [address] Phone: [phone]   Fax:  [phone]  Physical Therapy Treatment  Patient Details  Name: Edward [name] MRN: [id number] Date of Birth: [birth year] Referring Provider: Dr Einar CrowAnderson, [place]  Encounter Date: 03/27/2017      PT End of Session - 03/27/17 1928    Visit Number 3   Number of Visits 7   Date for PT Re-Evaluation 04/25/17   Authorization Type g code [id number]   Authorization Time Period 10   PT Start Time 1346   PT Stop Time 1431   PT Time Calculation (min) 45 min   Equipment Utilized During Treatment Gait belt   Activity Tolerance Patient tolerated treatment well   Behavior [facility] Therapy [facility] for tasks assessed/performed;Flat affect      Past Medical History:  Diagnosis Date  . Anemia   . Atrial fibrillation and flutter (HCC)   . GERD (gastroesophageal reflux disease)   . HTN (hypertension)    controlled with medication  . Internal hemorrhoids with complication   . Sinoatrial node dysfunction (HCC)     Past Surgical History:  Procedure Laterality Date  . CARDIOVERSION N/A 07/11/2014   Procedure: CARDIOVERSION;  Surgeon: Pricilla RifflePaula Ross V, MD;  [facility];  Service: Cardiovascular;  Laterality: N/A;  . COLONOSCOPY WITH PROPOFOL N/A 03/02/2017   Procedure: COLONOSCOPY WITH PROPOFOL;  Surgeon: Wyline MoodAnna, Kiran, MD;  [facility];  Service: Endoscopy;  Laterality: N/A;  . ESOPHAGOGASTRODUODENOSCOPY N/A 03/02/2017   Procedure: ESOPHAGOGASTRODUODENOSCOPY (EGD);  Surgeon: Wyline MoodAnna, Kiran, MD;  [facility];  Service: Endoscopy;  Laterality: N/A;  . HEMORROIDECTOMY    . PACEMAKER PLACEMENT    . TEE WITHOUT CARDIOVERSION N/A 07/11/2014   Procedure: TRANSESOPHAGEAL ECHOCARDIOGRAM (TEE);  Surgeon: Pricilla RifflePaula Ross V, MD;  [facility];  Service: Cardiovascular;  Laterality: N/A;    There were no vitals filed for this visit.       Subjective Assessment - 03/27/17 1347    Subjective Pt reports no change since last session and no pain at this time. Pt reports he has walked a tenth of mile today. He reports his HEP has gone pretty good, but he is having trouble with tandem walking at home, but has had no falls.   Pertinent History Pt is a 71 y/o male, pt comes to therapy ambulating with no AD; pt was admitted to hospital 03/06/17 and discharged 03/09/17; Pt is anemic and had problems with hemorrhoids bursting and decreased hemoglobin; Pt drove himself to ED where he received a blood transfusion; Prior to the transfusion pt was feeling weak in the legs and dizziness but that has been resolved; Pt was seen by acute care PT in hospital and when walking with the therapist, acute care PT decided to refer him for balance and weakness;    How long can you sit comfortably? 3+ hours   How long can you stand comfortably? 1-1.5 hours   How long can you walk comfortably? 1000 ft   Diagnostic tests had EGD which showed no active bleeding   Patient Stated Goals to get better   Currently in Pain? No/denies   Multiple Pain Sites No        Follow up on HEP, Pt has been doing HEP with no issues except for reported difficulty with tandem stance and walking;  Neuro Re-education: Stance on 2" foam in parallel bars: -romberg, followed by semi tandem x 30 sec each; progressed with head  turns 2 x 30 sec each; pt demonstrated increased sway in romberg and tandem stance, but he was able to stand quietly without LOB; with head turns pt had several LOB requiring UE assist and minA.   Tandem walking 3 x 10' on Airex min use of UEs   1x 10' on firm surface   3 x 10' on 2x4 plank with min use of hands, LOB x 2-3 each rep and CGA for safety; cues to tighten core and attempt to control hip drop/pelvic instability in single support phase of gait. Pt had min improvement with cues.  Ther-ex: Clamshells 2 x 20 reps each side with red t-band at knees; cues for  proper technique and to avoid trunk rotation; in order to increase hip abd strength and pelvic stability for improved balance/decreased fall risk  Neuro Re-education: Ambulating backwards in // bars 5x 10' with cue for increasing step width to prevent tripping due to scissoring, and for increased speed and stride length; pt's stability and gait improved extensively in session.   Weighted small ball pass on 2" foam with progression from normal stance, to romberg, and to semi tandem x 1 min each. Pt had increased stability compared to beginning of session. Edward [name] was passed from high and low and at variable distances to improve pt's stability with reaching out of his BOS.           PT Education - 03/27/17 1928    Education provided Yes   Education Details HEP updated/progressed, balance, ther-ex   Person(s) Educated Patient   Methods Explanation;Demonstration;Tactile cues;Verbal cues   Comprehension Verbal cues required;Tactile cues required;Returned demonstration;Verbalized understanding             PT Long Term Goals - 03/14/17 1712      PT LONG TERM GOAL #1   Title Pt will decrease time in the 5 times sit to stand to < 14.2 sec in order to decrease fall risk   Baseline 21 sec   Time 6   Period Weeks   Status New     PT LONG TERM GOAL #2   Title Pt will tandem walk 20 feet with no loss of balance in order to improve balance with narrow support and improve gait safety;   Baseline unable to take 3 steps in tandem walk   Time 6   Period Weeks   Status New     PT LONG TERM GOAL #3   Title Pt will become independent in HEP to improve carryover from PT session interventions and improve higher level balance.   Time 6   Period Weeks   Status New               Plan - 03/27/17 1930    Clinical Impression Statement Pt led in ther-ex and neuro re-education to improve balance and gait. In tandem walking he demonstrates decreased pelvic control in single LE support. Pt  benefitted from updated HEP with strengthening exercises for glut med for increased pelvic control and balance. Pt showed improvement in advance balance activities from beginning of session to end of session. Pt will benefit from continued skilled therapy in order to maximize safety and functional independence with mobility and ADLs/IADLs.    Rehab Potential Good   Clinical Impairments Affecting Rehab Potential good: low fall history; pt is motivated; wants to keep independence; poor: age; lives alone; comorbidities   PT Frequency 1x / week   PT Duration 6 weeks   PT Treatment/Interventions Energy conservation;Gait training;Therapeutic  activities;Therapeutic exercise;Balance training;Neuromuscular re-education   PT Next Visit Plan higher level balance activities   PT Home Exercise Plan see pt instructions   Consulted and Agree with Plan of Care Patient      Patient will benefit from skilled therapeutic intervention in order to improve the following deficits and impairments:  Decreased activity tolerance, Decreased balance, Difficulty walking  Visit Diagnosis: Unsteadiness on feet     Problem List Patient Active Problem List   Diagnosis Date Noted  . GI bleed 02/27/2017  . Chronic diastolic heart failure (HCC) 07/14/2014  . Atrial fibrillation with RVR (HCC)   . Chronic anticoagulation   . SIRS (systemic inflammatory response syndrome) (HCC)   . Cellulitis of leg, left 07/09/2014  . Altered mental status   . Blood poisoning   . Sepsis (HCC) 07/08/2014  . Atrial fibrillation and flutter (HCC) 07/08/2014  . Left ventricular dysfunction 07/08/2014  . Hypothyroidism 07/08/2014  . Sick sinus syndrome (HCC) 07/08/2014  . Cardiac pacemaker 07/08/2014  . 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 Benign prostatic hyperplasia with urinary obstruction 05/02/2012   Edward [name], SPT This entire session was performed under direct supervision and direction of a licensed therapist/therapist  assistant . I have personally read, edited and approve of the note as written.   Trotter,Margaret PT, DPT 03/28/2017, 11:58 AM   [facility] MAIN [facility] SERVICES [address] [place], [place], [address] Phone: [phone]   Fax:  [phone]  Name: Edward [name] MRN: [id number] Date of Birth: 20-May-1946

## 2017-03-29 ENCOUNTER — Ambulatory Visit: Payer: Medicare HMO | Admitting: Physical Therapy

## 2017-04-03 ENCOUNTER — Encounter: Payer: Self-pay | Admitting: Physical Therapy

## 2017-04-03 ENCOUNTER — Ambulatory Visit: Payer: Medicare HMO | Admitting: Physical Therapy

## 2017-04-03 DIAGNOSIS — R2681 Unsteadiness on feet: Secondary | ICD-10-CM

## 2017-04-03 NOTE — Therapy (Signed)
[facility] MAIN [facility] SERVICES [address] [place], [place], [address] Phone: [phone]   Fax:  [phone]  Physical Therapy Treatment  Patient Details  Name: Edward [name] MRN: [id number] Date of Birth: [birth year] Referring Provider: Dr Einar Crow  Encounter Date: 04/03/2017      PT End of Session - 04/03/17 1341    Visit Number 4   Number of Visits 7   Date for PT Re-Evaluation 04/25/17   Authorization Type g code 4   Authorization Time Period 10   PT Start Time 1345   PT Stop Time 1430   PT Time Calculation (min) 45 min   Equipment Utilized During Treatment Gait belt   Activity Tolerance Patient tolerated treatment well   Behavior During Therapy [facility] for tasks assessed/performed;Flat affect      Past Medical History:  Diagnosis Date  . Anemia   . Atrial fibrillation and flutter (HCC)   . GERD (gastroesophageal reflux disease)   . HTN (hypertension)    controlled with medication  . Internal hemorrhoids with complication   . Sinoatrial node dysfunction (HCC)     Past Surgical History:  Procedure Laterality Date  . CARDIOVERSION N/A 07/11/2014   Procedure: CARDIOVERSION;  Surgeon: Pricilla Riffle, MD;  Location[facility] ENDOSCOPY;  Service: Cardiovascular;  Laterality: N/A;  . COLONOSCOPY WITH PROPOFOL N/A 03/02/2017   Procedure: COLONOSCOPY WITH PROPOFOL;  Surgeon: Wyline Mood, MD;  [facility] ENDOSCOPY;  Service: Endoscopy;  Laterality: N/A;  . ESOPHAGOGASTRODUODENOSCOPY N/A 03/02/2017   Procedure: ESOPHAGOGASTRODUODENOSCOPY (EGD);  Surgeon: Wyline Mood, MD;  Location: [facility] ENDOSCOPY;  Service: Endoscopy;  Laterality: N/A;  . HEMORROIDECTOMY    . PACEMAKER PLACEMENT    . TEE WITHOUT CARDIOVERSION N/A 07/11/2014   Procedure: TRANSESOPHAGEAL ECHOCARDIOGRAM (TEE);  Surgeon: Pricilla Riffle, MD;  Location: [facility] ENDOSCOPY;  Service: Cardiovascular;  Laterality: N/A;    There were no vitals filed for this visit.         Subjective Assessment - 04/03/17 1345    Subjective Pt reports no falls, Pt states he is doing fine today;    Pertinent History Pt is a 71 y/o male, pt comes to therapy ambulating with no AD; pt was admitted to hospital 03/06/17 and discharged 03/09/17; Pt is anemic and had problems with hemorrhoids bursting and decreased hemoglobin; Pt drove himself to ED where he received a blood transfusion; Prior to the transfusion pt was feeling weak in the legs and dizziness but that has been resolved; Pt was seen by acute care PT in hospital and when walking with the therapist, acute care PT decided to refer him for balance and weakness;    How long can you sit comfortably? 3+ hours   How long can you stand comfortably? 1-1.5 hours   How long can you walk comfortably? 1000 ft   Diagnostic tests had EGD which showed no active bleeding   Patient Stated Goals to get better   Currently in Pain? No/denies    PT TREATMENT;  Resisted Walking 12.5# fwd/back 4 x 10 feet fwd/back; Pt cued to keep core tight in order to help postural control while walking; Pt demonstrated good control with backwards walking and no LOB;  Lateral Step downs off 5" step 2 x 10 ea leg; in order to increase hip abductor strength; Pt reported fatigue in stance leg after exercise; Pt cued to not bear weight through the leg tapping the floor, and push solely through the stance leg on box, in  order to strengthen stance leg abductor;  Heel To Toe Raises; with UE support x 10 ea;   Airex pad reaching down to pick up cones and standing back up  x 5 reaching 2 cones; feet together pt showed no difficulty or LOB  Single leg stance with opposite LE tapping fwd/lat/back; 2 x 5 ea; Pt had no UE support and showed more more sway when reaching back with LE;  Bosu ball step ups x 15 ea leg; with no UE support; Pt cued to gain postural control once on bosu before stepping off in order to decrease LOB;   1/2 foam roller; Rock fwd/back 2 x 10 ea  UE  flexion with PVC pipe; 2 x 10; Pt required extra time between sets in order to gain control and balance before next rep Tandem R/L forward; 3 x 20 sec ea eyes open; no UE support; Pt cued to keep gaze straight ahead in mirror and not down on feet;  Pt showed increase in sway during tandem stays and with UE movement;   Airex pad Tandem R/L forward; L foot forward- 15 sec and R foot forward 15 sec eyes closed; Pt required multiple attempts to gain balance and still needed UE support in order to not fall;   Backwards walking; no UE support 4 x 10 feet; Pt was able to walk backwards without deviating to the side as much;                              PT Education - 04/03/17 1341    Education provided Yes   Education Details HEP; balance    Person(s) Educated Patient   Methods Explanation;Verbal cues   Comprehension Verbalized understanding;Returned demonstration;Verbal cues required             PT Long Term Goals - 03/14/17 1712      PT LONG TERM GOAL #1   Title Pt will decrease time in the 5 times sit to stand to < 14.2 sec in order to decrease fall risk   Baseline 21 sec   Time 6   Period Weeks   Status New     PT LONG TERM GOAL #2   Title Pt will tandem walk 20 feet with no loss of balance in order to improve balance with narrow support and improve gait safety;   Baseline unable to take 3 steps in tandem walk   Time 6   Period Weeks   Status New     PT LONG TERM GOAL #3   Title Pt will become independent in HEP to improve carryover from PT session interventions and improve higher level balance.   Time 6   Period Weeks   Status New               Plan - 04/03/17 1342    Clinical Impression Statement Pt was instructed in LE abductor hip strengthening in order to improve stance control while balancing; Pt was also instructed in balance exercises; Pt was able to do most exercises without any upper extremity support; Pt demonstrated an increase  in sway with dynamic balance and exercises in tandem stance and with eyes closed; Pt would benefit from continued skilled PT in order to improve balance, strengthening, and gait safety;    Rehab Potential Good   Clinical Impairments Affecting Rehab Potential good: low fall history; pt is motivated; wants to keep independence; poor: age; lives alone; comorbidities  PT Frequency 1x / week   PT Duration 6 weeks   PT Treatment/Interventions Energy conservation;Gait training;Therapeutic activities;Therapeutic exercise;Balance training;Neuromuscular re-education   PT Next Visit Plan higher level balance activities   PT Home Exercise Plan see pt instructions   Consulted and Agree with Plan of Care Patient      Patient will benefit from skilled therapeutic intervention in order to improve the following deficits and impairments:  Decreased activity tolerance, Decreased balance, Difficulty walking  Visit Diagnosis: Unsteadiness on feet     Problem List Patient Active Problem List   Diagnosis Date Noted  . GI bleed 02/27/2017  . Chronic diastolic heart failure (HCC) 07/14/2014  . Atrial fibrillation with RVR (HCC)   . Chronic anticoagulation   . SIRS (systemic inflammatory response syndrome) (HCC)   . Cellulitis of leg, left 07/09/2014  . Altered mental status   . Blood poisoning   . Sepsis (HCC) 07/08/2014  . Atrial fibrillation and flutter (HCC) 07/08/2014  . Left ventricular dysfunction 07/08/2014  . Hypothyroidism 07/08/2014  . Sick sinus syndrome (HCC) 07/08/2014  . Cardiac pacemaker 07/08/2014  . 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 Benign prostatic hyperplasia with urinary obstruction 05/02/2012   Corrinne EagleMonica Renley Gutman SPT This entire session was performed under direct supervision and direction of a licensed Estate agenttherapist/therapist assistant . I have personally read, edited and approve of the note as written.  Trotter,Margaret PT, DPT 04/03/2017, 4:52 PM  [place] [facility] MAIN [facility] [address] , [place], [address] Phone: [phone]   Fax:  [phone]  Name: Edward [name] MRN: [id number] Date of Birth: Apr 12, 1946

## 2017-04-05 ENCOUNTER — Ambulatory Visit: Payer: Medicare HMO | Admitting: Physical Therapy

## 2017-04-06 ENCOUNTER — Ambulatory Visit: Payer: Medicare HMO | Admitting: Gastroenterology

## 2017-04-06 ENCOUNTER — Encounter: Payer: Self-pay | Admitting: Gastroenterology

## 2017-04-06 DIAGNOSIS — K219 Gastro-esophageal reflux disease without esophagitis: Secondary | ICD-10-CM | POA: Insufficient documentation

## 2017-04-10 ENCOUNTER — Ambulatory Visit: Payer: Medicare HMO | Attending: Internal Medicine | Admitting: Physical Therapy

## 2017-04-10 DIAGNOSIS — R2681 Unsteadiness on feet: Secondary | ICD-10-CM | POA: Insufficient documentation

## 2017-04-12 ENCOUNTER — Encounter: Payer: Medicare HMO | Admitting: Physical Therapy

## 2017-04-17 ENCOUNTER — Ambulatory Visit: Payer: Medicare HMO | Admitting: Physical Therapy

## 2017-04-19 ENCOUNTER — Ambulatory Visit: Payer: Medicare HMO | Admitting: Physical Therapy

## 2017-04-24 ENCOUNTER — Ambulatory Visit: Payer: Medicare HMO | Admitting: Physical Therapy

## 2017-04-26 ENCOUNTER — Encounter: Payer: Self-pay | Admitting: Physical Therapy

## 2017-04-26 ENCOUNTER — Ambulatory Visit: Payer: Medicare HMO | Admitting: Physical Therapy

## 2017-04-26 DIAGNOSIS — R2681 Unsteadiness on feet: Secondary | ICD-10-CM

## 2017-04-26 NOTE — Therapy (Deleted)
[facility] MAIN [facility] SERVICES [address] [place], [place], [address] Phone: [phone]   Fax:  [phone]  Physical Therapy Treatment  Patient Details  Name: Edward [name] MRN: [id number] Date of Birth: 02/12/1946 Referring Provider: Dr Einar Crow  Encounter Date: 04/26/2017    Past Medical History:  Diagnosis Date  . Anemia   . Atrial fibrillation and flutter (HCC)   . GERD (gastroesophageal reflux disease)   . HTN (hypertension)    controlled with medication  . Internal hemorrhoids with complication   . Sinoatrial node dysfunction (HCC)     Past Surgical History:  Procedure Laterality Date  . CARDIOVERSION N/A 07/11/2014   Procedure: CARDIOVERSION;  Surgeon: Pricilla Riffle, MD;  Location[facility] ENDOSCOPY;  Service: Cardiovascular;  Laterality: N/A;  . COLONOSCOPY WITH PROPOFOL N/A 03/02/2017   Procedure: COLONOSCOPY WITH PROPOFOL;  Surgeon: Wyline Mood, MD;  Location: [facility] ENDOSCOPY;  Service: Endoscopy;  Laterality: N/A;  . ESOPHAGOGASTRODUODENOSCOPY N/A 03/02/2017   Procedure: ESOPHAGOGASTRODUODENOSCOPY (EGD);  Surgeon: Wyline Mood, MD;  Location: [facility] ENDOSCOPY;  Service: Endoscopy;  Laterality: N/A;  . HEMORROIDECTOMY    . PACEMAKER PLACEMENT    . TEE WITHOUT CARDIOVERSION N/A 07/11/2014   Procedure: TRANSESOPHAGEAL ECHOCARDIOGRAM (TEE);  Surgeon: Pricilla Riffle, MD;  Location: [facility] ENDOSCOPY;  Service: Cardiovascular;  Laterality: N/A;    There were no vitals filed for this visit.                Treatment:  Goals re-assessed:  5x Sit<>stand 12.93  Tandem walk x 20'  HEP  Ther-ex:  Resisted Walking: Fwd against resistance and backwards return 12.5#  Sidestepping Backwards against resistance, forwards return   Sit<>stand with resistance  Neuro Re-education:    Tandem walking Airex beam    Mini-squats on Bosu   SLS with tapping, head turns                       PT Long Term  Goals - 03/14/17 1712      PT LONG TERM GOAL #1   Title Pt will decrease time in the 5 times sit to stand to < 14.2 sec in order to decrease fall risk   Baseline 21 sec   Time 6   Period Weeks   Status New     PT LONG TERM GOAL #2   Title Pt will tandem walk 20 feet with no loss of balance in order to improve balance with narrow support and improve gait safety;   Baseline unable to take 3 steps in tandem walk   Time 6   Period Weeks   Status New     PT LONG TERM GOAL #3   Title Pt will become independent in HEP to improve carryover from PT session interventions and improve higher level balance.   Time 6   Period Weeks   Status New             Patient will benefit from skilled therapeutic intervention in order to improve the following deficits and impairments:     Visit Diagnosis: No diagnosis found.     Problem List Patient Active Problem List   Diagnosis Date Noted  . GERD (gastroesophageal reflux disease) 04/06/2017  . GI bleed 02/27/2017  . Chronic systolic CHF (congestive heart failure) (HCC) 06/20/2016  . Cognitive deficits 05/05/2016  . Chronic diastolic heart failure (HCC) 07/14/2014  . Atrial fibrillation with RVR (HCC)   . Chronic anticoagulation   .  SIRS (systemic inflammatory response syndrome) (HCC)   . Cellulitis of leg, left 07/09/2014  . Altered mental status   . Blood poisoning   . Sepsis (HCC) 07/08/2014  . Atrial fibrillation and flutter (HCC) 07/08/2014  . Left ventricular dysfunction 07/08/2014  . Hypothyroidism 07/08/2014  . Sick sinus syndrome (HCC) 07/08/2014  . Cardiac pacemaker 07/08/2014  . 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 Benign prostatic hyperplasia with urinary obstruction 05/02/2012  . Incomplete emptying of bladder 05/02/2012  . Increased frequency of urination 05/02/2012    Anala Whisenant 04/26/2017, 2:33 PM  [place] [facility] MAIN [facility] SERVICES [address] [place], [place],  [address] Phone: [phone]   Fax:  [phone]  Name: Edward [name] MRN: [id number] Date of Birth: 1945-12-06

## 2017-04-26 NOTE — Therapy (Signed)
[place] MAIN [facility] SERVICES [address] [place], [place], [address] Phone: [phone]   Fax:  [phone]  Physical Therapy Treatment/ Discharge Note  Patient Details  Name: Edward [name] MRN: [id number] Date of Birth: [birth year] Referring Provider: Dr Frazier Richards  Encounter Date: 04/26/2017      PT End of Session - 04/26/17 1802    Visit Number 5   Number of Visits 7   Date for PT Re-Evaluation 04/25/17   Authorization Type g code 5   Authorization Time Period 10   PT Start Time 1430   PT Stop Time 1512   PT Time Calculation (min) 42 min   Equipment Utilized During Treatment Gait belt   Activity Tolerance Patient tolerated treatment well   Behavior During Therapy [facility] for tasks assessed/performed;Flat affect      Past Medical History:  Diagnosis Date  . Anemia   . Atrial fibrillation and flutter ([place])   . GERD (gastroesophageal reflux disease)   . HTN (hypertension)    controlled with medication  . Internal hemorrhoids with complication   . Sinoatrial node dysfunction (HCC)     Past Surgical History:  Procedure Laterality Date  . CARDIOVERSION N/A 07/11/2014   Procedure: CARDIOVERSION;  Surgeon: Fay Records, MD;  Location: [place];  Service: Cardiovascular;  Laterality: N/A;  . COLONOSCOPY WITH PROPOFOL N/A 03/02/2017   Procedure: COLONOSCOPY WITH PROPOFOL;  Surgeon: Jonathon Bellows, MD;  Location: [facility] ENDOSCOPY;  Service: Endoscopy;  Laterality: N/A;  . ESOPHAGOGASTRODUODENOSCOPY N/A 03/02/2017   Procedure: ESOPHAGOGASTRODUODENOSCOPY (EGD);  Surgeon: Jonathon Bellows, MD;  Location: [facility] ENDOSCOPY;  Service: Endoscopy;  Laterality: N/A;  . HEMORROIDECTOMY    . PACEMAKER PLACEMENT    . TEE WITHOUT CARDIOVERSION N/A 07/11/2014   Procedure: TRANSESOPHAGEAL ECHOCARDIOGRAM (TEE);  Surgeon: Fay Records, MD;  Location: [place];  Service: Cardiovascular;  Laterality: N/A;    There were no vitals filed for this  visit.      Subjective Assessment - 04/26/17 1800    Subjective Pt reports he is doing well, no changes since last session, other than his car breaking down. His car is fixed now. Pt reports no pain at this time.   Pertinent History Pt is a 71 y/o male, pt comes to therapy ambulating with no AD; pt was admitted to hospital 03/06/17 and discharged 03/09/17; Pt is anemic and had problems with hemorrhoids bursting and decreased hemoglobin; Pt drove himself to ED where he received a blood transfusion; Prior to the transfusion pt was feeling weak in the legs and dizziness but that has been resolved; Pt was seen by acute care PT in hospital and when walking with the therapist, acute care PT decided to refer him for balance and weakness;    How long can you sit comfortably? 3+ hours   How long can you stand comfortably? 1-1.5 hours   How long can you walk comfortably? 1000 ft   Diagnostic tests had EGD which showed no active bleeding   Patient Stated Goals to get better   Currently in Pain? No/denies       Treatment:  Goals re-assessed:  5x Sit<>stand: 12.93 significant improvement from 21 sec on 03/14/17 indicating pt is low fall risk.  Tandem walk, pt able to walk about 10' without LOB or physical assist; goal revised as pt has shown improvement. Previously pt unable to take 3 steps in tandem walking  HEP pt is performing about once a week currently, but reports he  understands all of his HEP and is able to perform independently. HEP updated with sensory adaptation training  Neuro Re-education:  Resisted Walking: x 4 each Fwd against resistance and backwards return x15' down and back resisted 12.5#  Backwards against resistance, forwards return x15' down and back resisted 12.5# ; cues to stop randomly to challenge pt; cues to speed up and slow down; pt had no LOB; CGA for safety  Tandem walk x 20'; pt having to toe tap to regain balance occasionally, but required to physical assist to maintain  balance. He was able to achieve up to 6 steps consecutively with line on the ground for visual cue; CGA for safety    Backwards walking on firm surface x 80' with CGA; pt was able to take large steps with no LOB; pt veered slightly to the L but was able to correct with min verbal cues.     SLS with CGA x approx 2 min each side; pt able to maintain for >10 sec on RLE, and 4-5 sec on LLE; pt required UE support for set up and CGA for safety.    Pt instructed in sensory adaptation training (see patient instructions for details) with progression in stance from romberg, to semi-tandem, to tandem while standing in a corner with a sturdy chair in front for safety. Each stance 2 x 30 sec; pt cued to keep hands near the wall for safety. Pt had minor LOB in tandem stance but was able to safely recover balance independently with SGA using UE support from walls.                    PT Education - 04/26/17 1801    Education provided Yes   Education Details HEP, goal and outcome measure progression, neuro re-ed   Person(s) Educated Patient   Methods Explanation;Demonstration;Tactile cues;Verbal cues   Comprehension Verbal cues required;Tactile cues required;Returned demonstration;Verbalized understanding             PT Long Term Goals - 04/26/17 1437      PT LONG TERM GOAL #1   Title Pt will decrease time in the 5 times sit to stand to < 14.2 sec in order to decrease fall risk   Baseline 12.93 indicating decreased fall risk and significant improvement   Time 6   Period Weeks   Status Achieved     PT LONG TERM GOAL #2   Title Pt will tandem walk 10 feet with no loss of balance in order to improve balance with narrow support and improve gait safety; (revised from 20')   Baseline Able to take about 6 steps, approx 10'; able to stand in tandem for >30 sec without LOB   Time 6   Period Weeks   Status Revised     PT LONG TERM GOAL #3   Title Pt will become independent in HEP to  improve carryover from PT session interventions and improve higher level balance.   Baseline Pt has low adherance, but can perform independently about once per week   Time 6   Period Weeks   Status Achieved               Plan - 04/26/17 1819    Clinical Impression Statement Pt led in neuro re-education including resisted walking, tandem walking, and backwards walking. Pt's goals assessed with all goals met. Pt's 5x sit<>stand time indicates he has made significant improvement and is at a reduced fall risk. Plan of care discussed with  pt; pt reports that all of his goals for therapy have been met and that he feels independent with his HEP. Pt instructed HEP progression including sensory adaptation training to continue balance training at home. Pt is appropriate for D/C from physical therapy at this time due to goals being met and pt's decreased fall risk and improved balance.   Rehab Potential Good   Clinical Impairments Affecting Rehab Potential good: low fall history; pt is motivated; wants to keep independence; poor: age; lives alone; comorbidities   PT Frequency 1x / week   PT Duration 6 weeks   PT Treatment/Interventions Energy conservation;Gait training;Therapeutic activities;Therapeutic exercise;Balance training;Neuromuscular re-education   PT Next Visit Plan higher level balance activities   PT Home Exercise Plan see pt instructions   Consulted and Agree with Plan of Care Patient      Patient will benefit from skilled therapeutic intervention in order to improve the following deficits and impairments:  Decreased activity tolerance, Decreased balance, Difficulty walking  Visit Diagnosis: Unsteadiness on feet       G-Codes - 05/10/17 1843    Functional Assessment Tool Used (Outpatient Only) 5 times sit<>Stand, clinical judgement;    Functional Limitation Mobility: Walking and moving around   Mobility: Walking and Moving Around Goal Status [phone]) At least 1 percent but  less than 20 percent impaired, limited or restricted   Mobility: Walking and Moving Around Discharge Status [phone]) At least 1 percent but less than 20 percent impaired, limited or restricted      Problem List Patient Active Problem List   Diagnosis Date Noted  . GERD (gastroesophageal reflux disease) 04/06/2017  . GI bleed 02/27/2017  . Chronic systolic CHF (congestive heart failure) ([place]) 06/20/2016  . Cognitive deficits 05/05/2016  . Chronic diastolic heart failure ([place]) 07/14/2014  . Atrial fibrillation with RVR ([place])   . Chronic anticoagulation   . SIRS (systemic inflammatory response syndrome) (HCC)   . Cellulitis of leg, left 07/09/2014  . Altered mental status   . Blood poisoning   . Sepsis ([place]) 07/08/2014  . Atrial fibrillation and flutter ([place]) 07/08/2014  . Left ventricular dysfunction 07/08/2014  . Hypothyroidism 07/08/2014  . Sick sinus syndrome ([place]) 07/08/2014  . Cardiac pacemaker 07/08/2014  . 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 Benign prostatic hyperplasia with urinary obstruction 05/02/2012  . Incomplete emptying of bladder 05/02/2012  . Increased frequency of urination 05/02/2012   Burnett Corrente, SPT This entire session was performed under direct supervision and direction of a licensed therapist/therapist assistant . I have personally read, edited and approve of the note as written.  Trotter,Margaret PT, DPT 05/10/17, 6:43 PM   [place] MAIN [facility] SERVICES [address] [place], [place], [address] Phone: [phone]   Fax:  [phone]  Name: YOGESH COMINSKY MRN: [id number] Date of Birth: 11-Feb-1946

## 2017-04-26 NOTE — Patient Instructions (Signed)
Balance: Eyes Opened     Stand in a corner with a sturdy chair in front of you.  Stand with fingertips near the wall.  1) Stand with feet together for 30 seconds at a time            When you can do this easily go to step 2. 2) Stand in staggard stance with one foot ahead of the other for 30 seconds at a time            When you can do this easily go to step 3. 3) Stand in tandem stance heel to toe for 30 seconds at a time  Do this for about 10 minutes twice a day.

## 2017-05-01 ENCOUNTER — Ambulatory Visit: Payer: Medicare HMO | Admitting: Physical Therapy

## 2017-05-03 ENCOUNTER — Ambulatory Visit: Payer: Medicare HMO | Admitting: Physical Therapy

## 2017-05-09 ENCOUNTER — Inpatient Hospital Stay: Payer: Medicare HMO | Admitting: Oncology

## 2017-05-17 ENCOUNTER — Telehealth: Payer: Self-pay | Admitting: Gastroenterology

## 2017-05-17 ENCOUNTER — Other Ambulatory Visit: Payer: Self-pay

## 2017-05-17 MED ORDER — PANTOPRAZOLE SODIUM 40 MG PO TBEC: 40.0000 mg | DELAYED_RELEASE_TABLET | Freq: Every day | ORAL | 2 refills | Status: AC

## 2017-05-17 NOTE — Telephone Encounter (Signed)
Patient came in and stated he needed a refill that Dr. Tobi BastosAnna gave him in the hospital for his stomach. Generic for [employer] [facility] on [address]

## 2017-05-29 ENCOUNTER — Encounter: Payer: Self-pay | Admitting: Gastroenterology

## 2017-05-29 ENCOUNTER — Ambulatory Visit: Payer: Medicare HMO | Admitting: Gastroenterology

## 2017-05-29 DIAGNOSIS — I10 Essential (primary) hypertension: Secondary | ICD-10-CM | POA: Insufficient documentation

## 2017-08-27 ENCOUNTER — Other Ambulatory Visit: Payer: Self-pay

## 2017-08-27 ENCOUNTER — Encounter: Payer: Self-pay | Admitting: *Deleted

## 2017-08-27 ENCOUNTER — Emergency Department
Admission: EM | Admit: 2017-08-27 | Discharge: 2017-08-27 | Disposition: A | Discharge status: Home / Self Care | Payer: Medicare HMO | Attending: Emergency Medicine | Admitting: Emergency Medicine

## 2017-08-27 DIAGNOSIS — Z79899 Other long term (current) drug therapy: Secondary | ICD-10-CM | POA: Diagnosis not present

## 2017-08-27 DIAGNOSIS — I5042 Chronic combined systolic (congestive) and diastolic (congestive) heart failure: Secondary | ICD-10-CM | POA: Diagnosis not present

## 2017-08-27 DIAGNOSIS — E039 Hypothyroidism, unspecified: Secondary | ICD-10-CM | POA: Insufficient documentation

## 2017-08-27 DIAGNOSIS — Z87891 Personal history of nicotine dependence: Secondary | ICD-10-CM | POA: Insufficient documentation

## 2017-08-27 DIAGNOSIS — R55 Syncope and collapse: Secondary | ICD-10-CM | POA: Insufficient documentation

## 2017-08-27 DIAGNOSIS — Z950 Presence of cardiac pacemaker: Secondary | ICD-10-CM | POA: Insufficient documentation

## 2017-08-27 DIAGNOSIS — Z7901 Long term (current) use of anticoagulants: Secondary | ICD-10-CM | POA: Insufficient documentation

## 2017-08-27 DIAGNOSIS — I110 Hypertensive heart disease with heart failure: Secondary | ICD-10-CM | POA: Diagnosis not present

## 2017-08-27 LAB — BASIC METABOLIC PANEL
ANION GAP: 8 (ref 5–15)
BUN: 22 mg/dL — AB (ref 6–20)
CALCIUM: 8.9 mg/dL (ref 8.9–10.3)
CO2: 24 mmol/L (ref 22–32)
Chloride: 106 mmol/L (ref 101–111)
Creatinine, Ser: 1.84 mg/dL — ABNORMAL HIGH (ref 0.61–1.24)
GFR calc Af Amer: 40 mL/min — ABNORMAL LOW (ref >60)
GFR, EST NON AFRICAN AMERICAN: 35 mL/min — AB (ref >60)
GLUCOSE: 116 mg/dL — AB (ref 65–99)
Potassium: 4.1 mmol/L (ref 3.5–5.1)
SODIUM: 138 mmol/L (ref 135–145)

## 2017-08-27 LAB — CBC WITH DIFFERENTIAL/PLATELET
BASOS ABS: 0 10*3/uL (ref 0–0.1)
Basophils Relative: 1 %
EOS ABS: 0 10*3/uL (ref 0–0.7)
EOS PCT: 1 %
HCT: 39.7 % — ABNORMAL LOW (ref 40.0–52.0)
Hemoglobin: 13.4 g/dL (ref 13.0–18.0)
LYMPHS PCT: 17 %
Lymphs Abs: 0.8 10*3/uL — ABNORMAL LOW (ref 1.0–3.6)
MCH: 29.7 pg (ref 26.0–34.0)
MCHC: 33.9 g/dL (ref 32.0–36.0)
MCV: 87.7 fL (ref 80.0–100.0)
MONO ABS: 0.3 10*3/uL (ref 0.2–1.0)
Monocytes Relative: 6 %
Neutro Abs: 3.7 10*3/uL (ref 1.4–6.5)
Neutrophils Relative %: 75 %
PLATELETS: 232 10*3/uL (ref 150–440)
RBC: 4.52 MIL/uL (ref 4.40–5.90)
RDW: 15.7 % — AB (ref 11.5–14.5)
WBC: 4.8 10*3/uL (ref 3.8–10.6)

## 2017-08-27 LAB — TROPONIN I: Troponin I: <0.03 ng/mL (ref <0.03)

## 2017-08-27 NOTE — ED Notes (Signed)
[phone] - Adela PortsMichael Alexandre call for a ride home if pt is discharged

## 2017-08-27 NOTE — ED Notes (Signed)
Pt given meal tray and is eating at this time. Pt agrees to eat and then will attempt to ambulate to assess if pt is able to ambulate independently and safely for discharge.

## 2017-08-27 NOTE — ED Notes (Signed)
PT attempted to walk around room and was doing well but then had an episode of dizziness and needed to sit back down. Pt continues to report he feels safe to go home. RN talked to pt about how it was not t a good idea to drive self home today. PT agrees to have a friend drive him home, he will rest, eat and drink plenty of fluids. Pt reports understanding of follow up and care if symptoms worsen.

## 2017-08-27 NOTE — ED Notes (Signed)
MD to bedside to update patient.

## 2017-08-27 NOTE — ED Notes (Signed)
Pts friend has called the ED back and reports he is on the way to the ED to pick up pt.

## 2017-08-27 NOTE — ED Notes (Addendum)
Successfully interrogated pts pacemaker, awaiting faxed results.

## 2017-08-27 NOTE — ED Triage Notes (Signed)
[place] [facility] [place] after a reported syncopal episode. Family reported episode lasted for approx 1 minute and pt was drowsy after waking. Pt is alert and oriented x 4 upon arrival and able to talk with MD without difficulty.   EMS administered 150 cc NS. Pt has a pacemaker that is reported to be a non demand pacemaker. EMS reports pacemaker is not firing.

## 2017-08-27 NOTE — ED Provider Notes (Addendum)
[facility] Emergency Department Provider Note  ____________________________________________   First MD Initiated Contact with Patient 08/27/17 1130     (approximate)  I have reviewed the triage vital signs and the nursing notes.   HISTORY  Chief Complaint Loss of Consciousness   HPI Edward [name] is a 71 y.o. male with a history of atrial fibrillation as well as CHF with a pacemaker who is presenting to the emergency department today after syncopal episode.  The patient says that he was standing up for about 2 hours greeting people at church when he began to feel lightheaded.  He says that someone rushed over to him to catch him before he fully passed out and so when he finally did lose consciousness he did not hit the ground.  There is no seizure activity reported nor was there any loss of bowel or bladder continence.  The patient says that he has not had any chest pain.  Denies feeling any palpitations.  He says that he has had near syncopal episodes before but is not fully passed out previously.  He says that he did not of any breakfast this morning but that is normal for him.  He denies any nausea, vomiting or diarrhea recently.  Denied any palpitation or chest pain prior to syncopized and.  Patient denies any recent medication dosage increases and says actually that he had several medications that were decreased over the past several months due to low blood pressure.   Past Medical History:  Diagnosis Date  . Anemia   . Atrial fibrillation and flutter (HCC)   . GERD (gastroesophageal reflux disease)   . HTN (hypertension)    controlled with medication  . Internal hemorrhoids with complication   . Sinoatrial node dysfunction [facility](HCC)     Patient Active Problem List   Diagnosis Date Noted  . Hypertension 05/29/2017  . GERD (gastroesophageal reflux disease) 04/06/2017  . GI bleed 02/27/2017  . Chronic systolic CHF (congestive heart failure) (HCC)  06/20/2016  . Cognitive deficits 05/05/2016  . Health care maintenance 04/01/2015  . Chronic diastolic heart failure (HCC) 07/14/2014  . Atrial fibrillation with RVR (HCC)   . Chronic anticoagulation   . SIRS (systemic inflammatory response syndrome) (HCC)   . Cellulitis of leg, left 07/09/2014  . Altered mental status   . Blood poisoning   . Sepsis (HCC) 07/08/2014  . Atrial fibrillation and flutter (HCC) 07/08/2014  . Left ventricular dysfunction 07/08/2014  . Hypothyroidism 07/08/2014  . Sick sinus syndrome (HCC) 07/08/2014  . Cardiac pacemaker 07/08/2014  . 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 Benign prostatic hyperplasia with urinary obstruction 05/02/2012  . Incomplete emptying of bladder 05/02/2012  . Increased frequency of urination 05/02/2012    Past Surgical History:  Procedure Laterality Date  . CARDIOVERSION N/A 07/11/2014   Procedure: CARDIOVERSION;  Surgeon: Pricilla RifflePaula Ross V, MD;  [facility];  Service: Cardiovascular;  Laterality: N/A;  . COLONOSCOPY WITH PROPOFOL N/A 03/02/2017   Procedure: COLONOSCOPY WITH PROPOFOL;  Surgeon: Wyline MoodAnna, Kiran, MD;  [facility];  Service: Endoscopy;  Laterality: N/A;  . ESOPHAGOGASTRODUODENOSCOPY N/A 03/02/2017   Procedure: ESOPHAGOGASTRODUODENOSCOPY (EGD);  Surgeon: Wyline MoodAnna, Kiran, MD;  [facility];  Service: Endoscopy;  Laterality: N/A;  . HEMORROIDECTOMY    . PACEMAKER PLACEMENT    . TEE WITHOUT CARDIOVERSION N/A 07/11/2014   Procedure: TRANSESOPHAGEAL ECHOCARDIOGRAM (TEE);  Surgeon: Pricilla RifflePaula Ross V, MD;  [facility];  Service: Cardiovascular;  Laterality: N/A;    Prior to  Admission medications   Medication Sig Start Date End Date Taking? Authorizing Provider  amiodarone (PACERONE) 200 MG tablet Take 0.5 tablets (100 mg total) by mouth 2 (two) times daily. Begin after you stop the 400 mg pill Patient taking differently: Take 100 mg by mouth daily.  07/15/14   Elenora Gamma, MD  apixaban  (ELIQUIS) 5 MG TABS tablet Take 1 tablet (5 mg total) by mouth 2 (two) times daily. 07/15/14   Elenora Gamma, MD  ferrous sulfate 325 (65 FE) MG tablet Take 1 tablet (325 mg total) by mouth 2 (two) times daily with a meal. 03/02/17   Enedina Finner, MD  finasteride (PROSCAR) 5 MG tablet  05/05/17   [provider]  KLOR-CON M10 10 MEQ tablet Take 10 mEq by mouth daily.  07/02/14   [provider]  levothyroxine (SYNTHROID, LEVOTHROID) 25 MCG tablet Take 25 mcg by mouth daily. 02/14/17   [provider]  metoprolol tartrate (LOPRESSOR) 25 MG tablet Take 0.5 tablets (12.5 mg total) by mouth 2 (two) times daily. 03/02/17   Enedina Finner, MD  pantoprazole (PROTONIX) 40 MG tablet Take 1 tablet (40 mg total) by mouth daily. 05/17/17   Wyline Mood, MD  tamsulosin [facility]) 0.4 MG CAPS capsule  05/05/17   [provider]  torsemide (DEMADEX) 5 MG tablet Take 1 tablet (5 mg total) by mouth daily. Patient taking differently: Take 10 mg by mouth daily.  07/15/14   Elenora Gamma, MD  vitamin B-12 (CYANOCOBALAMIN) 1000 MCG tablet Take 1,000 mcg by mouth daily.    [provider]    Allergies Penicillins  Family History  Problem Relation Age of Onset  . Leukemia Mother     Social History Social History   Tobacco Use  . Smoking status: Former Smoker    Packs/day: 1.50    Years: 10.00    Pack years: 15.00    Types: Cigarettes    Last attempt to quit: 11/29/1965    Years since quitting: 51.7  . Smokeless tobacco: Never Used  Substance Use Topics  . Alcohol use: No    Alcohol/week: 0.0 oz  . Drug use: Not on file    Review of Systems  Constitutional: No fever/chills Eyes: No visual changes. ENT: No sore throat. Cardiovascular: Denies chest pain. Respiratory: Denies shortness of breath. Gastrointestinal: No abdominal pain.  No nausea, no vomiting.  No diarrhea.  No constipation. Genitourinary: Negative for dysuria. Musculoskeletal: Negative for  back pain. Skin: Negative for rash. Neurological: Negative for headaches, focal weakness or numbness.   ____________________________________________   PHYSICAL EXAM:  VITAL SIGNS: ED Triage Vitals [08/27/17 1125]  Enc Vitals Group     BP (!) 129/58     Pulse Rate 63     Resp 11     Temp 98 F (36.7 C)     Temp Source Oral     SpO2 100 %     Weight 210 lb (95.3 kg)     Height 5' 8.5" (1.74 m)     Head Circumference      Peak Flow      Pain Score      Pain Loc      Pain Edu?      Excl. in GC?     Constitutional: Alert and oriented. Well appearing and in no acute distress. Eyes: Conjunctivae are normal.  Head: Atraumatic. Nose: No congestion/rhinnorhea. Mouth/Throat: Mucous membranes are moist.  Neck: No stridor.   Cardiovascular: Normal rate, regular  rhythm. Grossly normal heart sounds.  Respiratory: Normal respiratory effort.  No retractions. Lungs CTAB. Gastrointestinal: Soft and nontender. No distention. Musculoskeletal: No lower extremity tenderness nor edema.  No joint effusions. Neurologic:  Normal speech and language. No gross focal neurologic deficits are appreciated. Skin:  Skin is warm, dry and intact. No rash noted. Psychiatric: Mood and affect are normal. Speech and behavior are normal.  ____________________________________________   LABS (all labs ordered are listed, but only abnormal results are displayed)  Labs Reviewed  CBC WITH DIFFERENTIAL/PLATELET - Abnormal; Notable for the following components:      Result Value   HCT 39.7 (*)    RDW 15.7 (*)    Lymphs Abs 0.8 (*)    All other components within normal limits  BASIC METABOLIC PANEL - Abnormal; Notable for the following components:   Glucose, Bld 116 (*)    BUN 22 (*)    Creatinine, Ser 1.84 (*)    GFR calc non Af Amer 35 (*)    GFR calc Af Amer 40 (*)    All other components within normal limits  TROPONIN I  TROPONIN I   ____________________________________________  EKG  ED ECG  REPORT I, Arelia Longest, the attending physician, personally viewed and interpreted this ECG.   Date: 08/27/2017  EKG Time: 1122  Rate: 65  Rhythm: normal EKG, normal sinus rhythm, unchanged from previous tracings, atrial paced rhythm  Axis: Normal  Intervals:none  ST&T Change: No ST segment elevation or depression.  Mildly biphasic T waves in the lateral leads.  ____________________________________________  RADIOLOGY   ____________________________________________   PROCEDURES  Procedure(s) performed:   Procedures  Critical Care performed:   ____________________________________________   INITIAL IMPRESSION / ASSESSMENT AND PLAN / ED COURSE  Pertinent labs & imaging results that were available during my care of the patient were reviewed by me and considered in my medical decision making (see chart for details).  DDX: Syncope, arrhythmia, electrolyte abnormality, pacemaker malfunction As part of my medical decision making, I reviewed the following data within the electronic MEDICAL RECORD NUMBER Old chart reviewed  ----------------------------------------- 2:08 PM on 08/27/2017 -----------------------------------------  Discussed the pacemaker report Dr. call would who recommends doubling the patient's beta-blocker because of the episodes of fast atrial as well as ventricular beating as recently as this morning, but before the syncopal episode.    ----------------------------------------- 2:50 PM on 08/27/2017 -----------------------------------------  Patient continues to be astigmatic in the emergency department.  2- troponins.  Patient's blood pressure has been in the 100s-110s for the majority of time he has been here.  Therefore, I was concerned about raising his metoprolol.  I discussed this with Dr. call would who recommended raising the patient's amiodarone.  However, the patient says that the amiodarone was lower because he says that it was affecting his  thyroid.  Patient likely with a vasovagal episode despite the interpretation of the pacemaker showing periods of rapid rhythms.  Patient appears to have multiple dangers for medication adjustment at this time.  Because of this I believe the best thing for him to do will be to follow-up with his cardiologist, Dr. Darrold Junker for further medication adjustment.  We also discussed making sure to stay hydrated as well as sitting when he finds himself standing for long period of time. Patient did have several episodes of rapid heart rhythm on his pacer interrogation.  However, this was about 840 this morning and the syncopal episode occurred at about 06-1029.  Unlikely that these were related.  ____________________________________________   FINAL CLINICAL IMPRESSION(S) / ED DIAGNOSES  Syncope.    NEW MEDICATIONS STARTED DURING THIS VISIT:  This SmartLink is deprecated. Use AVSMEDLIST instead to display the medication list for a patient.   Note:  This document was prepared using Dragon voice recognition software and may include unintentional dictation errors.     Myrna BlazerSchaevitz, Lora Chavers Matthew, MD 08/27/17 1451    Loyce Flaming, Myra Rudeavid Matthew, MD 08/27/17 [phone]

## 2017-08-27 NOTE — ED Notes (Signed)
Attempted to call pts ride. No answer at this time but message was left for friend to call back as soon as possible

## 2017-08-27 NOTE — ED Notes (Signed)
[employer] REPORT -   Pt has a dual chamber pacemaker that is reported to have been within normal range upon last check.  Pt is reported to have had 6 Atrial high rate episodes and 6 Ventricular high rate episodes the last of which occurred today. The Ventricular high rate episode occurred at 8:37am and the Atrial high rate occurred at 8:38 am. [employer] has faxed over the reports and cardiac rhythms. MD aware.

## 2019-05-03 ENCOUNTER — Other Ambulatory Visit: Payer: Self-pay

## 2019-05-03 ENCOUNTER — Emergency Department: Payer: Medicare HMO

## 2019-05-03 ENCOUNTER — Emergency Department
Admission: EM | Admit: 2019-05-03 | Discharge: 2019-05-03 | Disposition: A | Discharge status: Home / Self Care | Payer: Medicare HMO | Attending: Student in an Organized Health Care Education/Training Program | Admitting: Student in an Organized Health Care Education/Training Program

## 2019-05-03 DIAGNOSIS — I110 Hypertensive heart disease with heart failure: Secondary | ICD-10-CM | POA: Insufficient documentation

## 2019-05-03 DIAGNOSIS — I5022 Chronic systolic (congestive) heart failure: Secondary | ICD-10-CM | POA: Diagnosis not present

## 2019-05-03 DIAGNOSIS — Z79899 Other long term (current) drug therapy: Secondary | ICD-10-CM | POA: Insufficient documentation

## 2019-05-03 DIAGNOSIS — E039 Hypothyroidism, unspecified: Secondary | ICD-10-CM | POA: Diagnosis not present

## 2019-05-03 DIAGNOSIS — Z87891 Personal history of nicotine dependence: Secondary | ICD-10-CM | POA: Diagnosis not present

## 2019-05-03 DIAGNOSIS — Z951 Presence of aortocoronary bypass graft: Secondary | ICD-10-CM | POA: Insufficient documentation

## 2019-05-03 DIAGNOSIS — R55 Syncope and collapse: Secondary | ICD-10-CM | POA: Diagnosis present

## 2019-05-03 LAB — COMPREHENSIVE METABOLIC PANEL
ALT: 144 U/L — ABNORMAL HIGH (ref 0–44)
AST: 113 U/L — ABNORMAL HIGH (ref 15–41)
Albumin: 3.5 g/dL (ref 3.5–5.0)
Alkaline Phosphatase: 129 U/L — ABNORMAL HIGH (ref 38–126)
Anion gap: 9 (ref 5–15)
BUN: 36 mg/dL — ABNORMAL HIGH (ref 8–23)
CO2: 21 mmol/L — ABNORMAL LOW (ref 22–32)
Calcium: 9.1 mg/dL (ref 8.9–10.3)
Chloride: 109 mmol/L (ref 98–111)
Creatinine, Ser: 1.79 mg/dL — ABNORMAL HIGH (ref 0.61–1.24)
GFR calc Af Amer: 42 mL/min — ABNORMAL LOW (ref >60)
GFR calc non Af Amer: 36 mL/min — ABNORMAL LOW (ref >60)
Glucose, Bld: 140 mg/dL — ABNORMAL HIGH (ref 70–99)
Potassium: 4.2 mmol/L (ref 3.5–5.1)
Sodium: 139 mmol/L (ref 135–145)
Total Bilirubin: 0.7 mg/dL (ref 0.3–1.2)
Total Protein: 6.6 g/dL (ref 6.5–8.1)

## 2019-05-03 LAB — CBC WITH DIFFERENTIAL/PLATELET
Abs Immature Granulocytes: 0.06 10*3/uL (ref 0.00–0.07)
Basophils Absolute: 0.1 10*3/uL (ref 0.0–0.1)
Basophils Relative: 1 %
Eosinophils Absolute: 0.1 10*3/uL (ref 0.0–0.5)
Eosinophils Relative: 2 %
HCT: 33.6 % — ABNORMAL LOW (ref 39.0–52.0)
Hemoglobin: 10.2 g/dL — ABNORMAL LOW (ref 13.0–17.0)
Immature Granulocytes: 1 %
Lymphocytes Relative: 10 %
Lymphs Abs: 0.7 10*3/uL (ref 0.7–4.0)
MCH: 26.4 pg (ref 26.0–34.0)
MCHC: 30.4 g/dL (ref 30.0–36.0)
MCV: 87 fL (ref 80.0–100.0)
Monocytes Absolute: 0.5 10*3/uL (ref 0.1–1.0)
Monocytes Relative: 7 %
Neutro Abs: 6 10*3/uL (ref 1.7–7.7)
Neutrophils Relative %: 79 %
Platelets: 274 10*3/uL (ref 150–400)
RBC: 3.86 MIL/uL — ABNORMAL LOW (ref 4.22–5.81)
RDW: 19.3 % — ABNORMAL HIGH (ref 11.5–15.5)
WBC: 7.5 10*3/uL (ref 4.0–10.5)
nRBC: 0 % (ref 0.0–0.2)

## 2019-05-03 LAB — URINALYSIS, COMPLETE (UACMP) WITH MICROSCOPIC
Bacteria, UA: NONE SEEN
Bilirubin Urine: NEGATIVE
Glucose, UA: NEGATIVE mg/dL
Hgb urine dipstick: NEGATIVE
Ketones, ur: NEGATIVE mg/dL
Leukocytes,Ua: NEGATIVE
Nitrite: NEGATIVE
Protein, ur: NEGATIVE mg/dL
Specific Gravity, Urine: 1.012 (ref 1.005–1.030)
Squamous Epithelial / LPF: NONE SEEN (ref 0–5)
pH: 5 (ref 5.0–8.0)

## 2019-05-03 LAB — TROPONIN I (HIGH SENSITIVITY)
Troponin I (High Sensitivity): 15 ng/L (ref <18)
Troponin I (High Sensitivity): 15 ng/L (ref <18)

## 2019-05-03 NOTE — ED Provider Notes (Signed)
[facility] Emergency Department Provider Note    First MD Initiated Contact with Patient 05/03/19 1101     (approximate)  I have reviewed the triage vital signs and the nursing notes.   HISTORY  Chief Complaint Near Syncope    HPI Edward [name] is a 73 y.o. male as well as a past medical history presents the ER for near syncopal episode that occurred after he stood up quickly felt lightheaded and dizzy for a few minutes and then sat back down.  Denies any chest pain or palpitations.  Denies any nausea.  No headache.  Denies any recent fevers.   Echo: [facility]  Moderately decreased left ventricular systolic function, ejection  fraction 35 to 40%  Mitral regurgitation - mild  Dilated left atrium - mild to moderate  Low normal right ventricular systolic function   Past Medical History:  Diagnosis Date  . Anemia   . Atrial fibrillation and flutter (HCC)   . GERD (gastroesophageal reflux disease)   . HTN (hypertension)    controlled with medication  . Internal hemorrhoids with complication   . Sinoatrial node dysfunction (HCC)    Family History  Problem Relation Age of Onset  . Leukemia Mother    Past Surgical History:  Procedure Laterality Date  . CARDIOVERSION N/A 07/11/2014   Procedure: CARDIOVERSION;  Surgeon: Pricilla RifflePaula Ross V, MD;  [facility];  Service: Cardiovascular;  Laterality: N/A;  . COLONOSCOPY WITH PROPOFOL N/A 03/02/2017   Procedure: COLONOSCOPY WITH PROPOFOL;  Surgeon: Wyline MoodAnna, Kiran, MD;  [facility];  Service: Endoscopy;  Laterality: N/A;  . ESOPHAGOGASTRODUODENOSCOPY N/A 03/02/2017   Procedure: ESOPHAGOGASTRODUODENOSCOPY (EGD);  Surgeon: Wyline MoodAnna, Kiran, MD;  [facility];  Service: Endoscopy;  Laterality: N/A;  . HEMORROIDECTOMY    . PACEMAKER PLACEMENT    . TEE WITHOUT CARDIOVERSION N/A 07/11/2014   Procedure: TRANSESOPHAGEAL ECHOCARDIOGRAM (TEE);  Surgeon: Pricilla RifflePaula Ross V, MD;  [facility];   Service: Cardiovascular;  Laterality: N/A;   Patient Active Problem List   Diagnosis Date Noted  . Hypertension 05/29/2017  . GERD (gastroesophageal reflux disease) 04/06/2017  . GI bleed 02/27/2017  . Chronic systolic CHF (congestive heart failure) (HCC) 06/20/2016  . Cognitive deficits 05/05/2016  . Health care maintenance 04/01/2015  . Chronic diastolic heart failure (HCC) 07/14/2014  . Atrial fibrillation with RVR (HCC)   . Chronic anticoagulation   . SIRS (systemic inflammatory response syndrome) (HCC)   . Cellulitis of leg, left 07/09/2014  . Altered mental status   . Blood poisoning   . Sepsis (HCC) 07/08/2014  . Atrial fibrillation and flutter (HCC) 07/08/2014  . Left ventricular dysfunction 07/08/2014  . Hypothyroidism 07/08/2014  . Sick sinus syndrome (HCC) 07/08/2014  . Cardiac pacemaker 07/08/2014  . 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 Benign prostatic hyperplasia with urinary obstruction 05/02/2012  . Incomplete emptying of bladder 05/02/2012  . Increased frequency of urination 05/02/2012      Prior to Admission medications   Medication Sig Start Date End Date Taking? Authorizing Provider  amiodarone (PACERONE) 200 MG tablet Take 0.5 tablets (100 mg total) by mouth 2 (two) times daily. Begin after you stop the 400 mg pill Patient taking differently: Take 100 mg by mouth daily.  07/15/14   Elenora GammaBradshaw, Samuel L, MD  apixaban (ELIQUIS) 5 MG TABS tablet Take 1 tablet (5 mg total) by mouth 2 (two) times daily. 07/15/14   Elenora GammaBradshaw, Samuel L, MD  ferrous sulfate 325 (65 FE) MG tablet Take 1 tablet (  325 mg total) by mouth 2 (two) times daily with a meal. 03/02/17   Fritzi Mandes, MD  finasteride (PROSCAR) 5 MG tablet  05/05/17   [provider]  KLOR-CON M10 10 MEQ tablet Take 10 mEq by mouth daily.  07/02/14   [provider]  levothyroxine (SYNTHROID, LEVOTHROID) 25 MCG tablet Take 25 mcg by mouth daily. 02/14/17   [provider]  metoprolol  tartrate (LOPRESSOR) 25 MG tablet Take 0.5 tablets (12.5 mg total) by mouth 2 (two) times daily. 03/02/17   Fritzi Mandes, MD  pantoprazole (PROTONIX) 40 MG tablet Take 1 tablet (40 mg total) by mouth daily. 05/17/17   Jonathon Bellows, MD  tamsulosin [facility]) 0.4 MG CAPS capsule  05/05/17   [provider]  torsemide (DEMADEX) 5 MG tablet Take 1 tablet (5 mg total) by mouth daily. Patient taking differently: Take 10 mg by mouth daily.  07/15/14   Timmothy Euler, MD  vitamin B-12 (CYANOCOBALAMIN) 1000 MCG tablet Take 1,000 mcg by mouth daily.    [provider]    Allergies Penicillins    Social History Social History   Tobacco Use  . Smoking status: Former Smoker    Packs/day: 1.50    Years: 10.00    Pack years: 15.00    Types: Cigarettes    Quit date: 11/29/1965    Years since quitting: 53.4  . Smokeless tobacco: Never Used  Substance Use Topics  . Alcohol use: No    Alcohol/week: 0.0 standard drinks  . Drug use: Not on file    Review of Systems Patient denies headaches, rhinorrhea, blurry vision, numbness, shortness of breath, chest pain, edema, cough, abdominal pain, nausea, vomiting, diarrhea, dysuria, fevers, rashes or hallucinations unless otherwise stated above in HPI. ____________________________________________   PHYSICAL EXAM:  VITAL SIGNS: Vitals:   05/03/19 1107  BP: 106/75  Pulse: (!) 101  Resp: 20  Temp: 98.5 F (36.9 C)  SpO2: 99%    Constitutional: Alert and oriented.  Eyes: Conjunctivae are normal.  Head: Atraumatic. Nose: No congestion/rhinnorhea. Mouth/Throat: Mucous membranes are moist.   Neck: No stridor. Painless ROM.  Cardiovascular: Normal rate, regular rhythm. Grossly normal heart sounds.  Good peripheral circulation. Respiratory: Normal respiratory effort.  No retractions. Lungs CTAB. Gastrointestinal: Soft and nontender. No distention. No abdominal bruits. No CVA tenderness. Genitourinary:  Musculoskeletal: No lower  extremity tenderness nor edema.  No joint effusions. Neurologic:  Normal speech and language. No gross focal neurologic deficits are appreciated. No facial droop Skin:  Skin is warm, dry and intact. No rash noted. Psychiatric: Mood and affect are normal. Speech and behavior are normal.  ____________________________________________   LABS (all labs ordered are listed, but only abnormal results are displayed)  Results for orders placed or performed during the hospital encounter of 05/03/19 (from the past 24 hour(s))  CBC with Differential/Platelet     Status: Abnormal   Collection Time: 05/03/19 11:18 AM  Result Value Ref Range   WBC 7.5 4.0 - 10.5 K/uL   RBC 3.86 (L) 4.22 - 5.81 MIL/uL   Hemoglobin 10.2 (L) 13.0 - 17.0 g/dL   HCT 33.6 (L) 39.0 - 52.0 %   MCV 87.0 80.0 - 100.0 fL   MCH 26.4 26.0 - 34.0 pg   MCHC 30.4 30.0 - 36.0 g/dL   RDW 19.3 (H) 11.5 - 15.5 %   Platelets 274 150 - 400 K/uL   nRBC 0.0 0.0 - 0.2 %   Neutrophils Relative % 79 %  Neutro Abs 6.0 1.7 - 7.7 K/uL   Lymphocytes Relative 10 %   Lymphs Abs 0.7 0.7 - 4.0 K/uL   Monocytes Relative 7 %   Monocytes Absolute 0.5 0.1 - 1.0 K/uL   Eosinophils Relative 2 %   Eosinophils Absolute 0.1 0.0 - 0.5 K/uL   Basophils Relative 1 %   Basophils Absolute 0.1 0.0 - 0.1 K/uL   Immature Granulocytes 1 %   Abs Immature Granulocytes 0.06 0.00 - 0.07 K/uL  Comprehensive metabolic panel     Status: Abnormal   Collection Time: 05/03/19 11:18 AM  Result Value Ref Range   Sodium 139 135 - 145 mmol/L   Potassium 4.2 3.5 - 5.1 mmol/L   Chloride 109 98 - 111 mmol/L   CO2 21 (L) 22 - 32 mmol/L   Glucose, Bld 140 (H) 70 - 99 mg/dL   BUN 36 (H) 8 - 23 mg/dL   Creatinine, Ser 1.611.79 (H) 0.61 - 1.24 mg/dL   Calcium 9.1 8.9 - 09.610.3 mg/dL   Total Protein 6.6 6.5 - 8.1 g/dL   Albumin 3.5 3.5 - 5.0 g/dL   AST 045113 (H) 15 - 41 U/L   ALT 144 (H) 0 - 44 U/L   Alkaline Phosphatase 129 (H) 38 - 126 U/L   Total Bilirubin 0.7 0.3 - 1.2 mg/dL    GFR calc non Af Amer 36 (L) >60 mL/min   GFR calc Af Amer 42 (L) >60 mL/min   Anion gap 9 5 - 15  Troponin I (High Sensitivity)     Status: None   Collection Time: 05/03/19 11:18 AM  Result Value Ref Range   Troponin I (High Sensitivity) 15 <18 ng/L  Urinalysis, Complete w Microscopic     Status: Abnormal   Collection Time: 05/03/19  1:19 PM  Result Value Ref Range   Color, Urine YELLOW (A) YELLOW   APPearance CLEAR (A) CLEAR   Specific Gravity, Urine 1.012 1.005 - 1.030   pH 5.0 5.0 - 8.0   Glucose, UA NEGATIVE NEGATIVE mg/dL   Hgb urine dipstick NEGATIVE NEGATIVE   Bilirubin Urine NEGATIVE NEGATIVE   Ketones, ur NEGATIVE NEGATIVE mg/dL   Protein, ur NEGATIVE NEGATIVE mg/dL   Nitrite NEGATIVE NEGATIVE   Leukocytes,Ua NEGATIVE NEGATIVE   RBC / HPF 0-5 0 - 5 RBC/hpf   WBC, UA 0-5 0 - 5 WBC/hpf   Bacteria, UA NONE SEEN NONE SEEN   Squamous Epithelial / LPF NONE SEEN 0 - 5   Mucus PRESENT   Troponin I (High Sensitivity)     Status: None   Collection Time: 05/03/19  2:46 PM  Result Value Ref Range   Troponin I (High Sensitivity) 15 <18 ng/L   ____________________________________________  EKG My review and personal interpretation at Time: 11:04   Indication: near syncope  Rate: 115  Rhythm: afib Axis: normal  Other: non specific st abn, no stemi ____________________________________________  RADIOLOGY  I personally reviewed all radiographic images ordered to evaluate for the above acute complaints and reviewed radiology reports and findings.  These findings were personally discussed with the patient.  Please see medical record for radiology report.  ____________________________________________   PROCEDURES  Procedure(s) performed:  Procedures    Critical Care performed: no ____________________________________________   INITIAL IMPRESSION / ASSESSMENT AND PLAN / ED COURSE  Pertinent labs & imaging results that were available during my care of the patient were  reviewed by me and considered in my medical decision making (see chart for details).  DDX: Dehydration, orthostasis, dysrhythmia, electrolyte abnormality, anemia  Edward [name] is a 73 y.o. who presents to the ED with symptoms as described above.  Patient well-appearing and already feels better.  No LOC or head injury.  Denies any chest pain or pressure.  Does have history of heart disease therefore will observe in the ER order serial enzymes and monitor patient.  Clinical Course as of May 03 1535  Fri May 03, 2019  1530 Patient was able to ambulate with steady gait.  Feels better after having food and drinking here in the ER.  On observation does not have any evidence of dysrhythmia or persistent RVR.  Did have a few runs 1 on arrival with EKG showing brief episode of RVR but overall he has been rate controlled on his home medication here.  At this point I do believe he is stable and appropriate for discharge home.  On review the medical records he had a similar presentation back in 2018.  Will encourage oral hydration and follow-up with cardiology.   [PR]    Clinical Course User Index [PR] Willy Eddy, MD    The patient was evaluated in Emergency Department today for the symptoms described in the history of present illness. He/she was evaluated in the context of the global COVID-19 pandemic, which necessitated consideration that the patient might be at risk for infection with the SARS-CoV-2 virus that causes COVID-19. Institutional protocols and algorithms that pertain to the evaluation of patients at risk for COVID-19 are in a state of rapid change based on information released by regulatory bodies including the CDC and federal and state organizations. These policies and algorithms were followed during the patient's care in the ED.  As part of my medical decision making, I reviewed the following data within the electronic MEDICAL RECORD NUMBER Nursing notes reviewed and incorporated, Labs  reviewed, notes from prior ED visits and [place] Controlled Substance Database   ____________________________________________   FINAL CLINICAL IMPRESSION(S) / ED DIAGNOSES  Final diagnoses:  Near syncope      NEW MEDICATIONS STARTED DURING THIS VISIT:  New Prescriptions   No medications on file     Note:  This document was prepared using Dragon voice recognition software and may include unintentional dictation errors.    Willy Eddy, MD 05/03/19 1536

## 2019-05-03 NOTE — ED Notes (Signed)
Pt given lunch tray and gingerale

## 2019-05-03 NOTE — ED Triage Notes (Signed)
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 Pt reports he "had a weak feeling with shaky legs and other things that might not be quite right" x 3 mins. Pt reports after sitting down he felt better, no LOC. PT reports he had an ECHO at [facility] 2 weeks ago and "they did not like what they saw". Pt in NAD and is A&Ox4 at this time

## 2019-05-03 NOTE — ED Notes (Signed)
E-signature pad unavailable for use at time of d/c, d/c instructions given and pt voices understanding of teaching

## 2019-11-04 DEATH — deceased
